# Patient Record
Sex: Female | Born: 1970
Health system: Southern US, Community
[De-identification: ages and names within clinical notes are randomized; demographics above are authoritative.]

## PROBLEM LIST (undated history)

## (undated) DIAGNOSIS — D8989 Other specified disorders involving the immune mechanism, not elsewhere classified: Secondary | ICD-10-CM

## (undated) DIAGNOSIS — A630 Anogenital (venereal) warts: Secondary | ICD-10-CM

## (undated) DIAGNOSIS — K219 Gastro-esophageal reflux disease without esophagitis: Secondary | ICD-10-CM

## (undated) DIAGNOSIS — I471 Supraventricular tachycardia, unspecified: Secondary | ICD-10-CM

## (undated) DIAGNOSIS — L509 Urticaria, unspecified: Secondary | ICD-10-CM

## (undated) DIAGNOSIS — M2550 Pain in unspecified joint: Secondary | ICD-10-CM

## (undated) DIAGNOSIS — F32A Depression, unspecified: Secondary | ICD-10-CM

## (undated) DIAGNOSIS — T7840XA Allergy, unspecified, initial encounter: Secondary | ICD-10-CM

## (undated) DIAGNOSIS — F419 Anxiety disorder, unspecified: Secondary | ICD-10-CM

## (undated) DIAGNOSIS — R87619 Unspecified abnormal cytological findings in specimens from cervix uteri: Secondary | ICD-10-CM

## (undated) DIAGNOSIS — E079 Disorder of thyroid, unspecified: Secondary | ICD-10-CM

## (undated) DIAGNOSIS — I10 Essential (primary) hypertension: Secondary | ICD-10-CM

## (undated) DIAGNOSIS — E039 Hypothyroidism, unspecified: Secondary | ICD-10-CM

## (undated) DIAGNOSIS — E559 Vitamin D deficiency, unspecified: Secondary | ICD-10-CM

## (undated) HISTORY — DX: Essential (primary) hypertension: I10

## (undated) HISTORY — DX: Anogenital (venereal) warts: A63.0

## (undated) HISTORY — DX: Pain in unspecified joint: M25.50

## (undated) HISTORY — DX: Allergy, unspecified, initial encounter: T78.40XA

## (undated) HISTORY — DX: Other specified disorders involving the immune mechanism, not elsewhere classified: D89.89

## (undated) HISTORY — DX: Vitamin D deficiency, unspecified: E55.9

## (undated) HISTORY — PX: CYST EXCISION: SHX5701

## (undated) HISTORY — PX: COLONOSCOPY: SHX174

## (undated) HISTORY — DX: Anxiety disorder, unspecified: F41.9

## (undated) HISTORY — DX: Depression, unspecified: F32.A

## (undated) HISTORY — DX: Gastro-esophageal reflux disease without esophagitis: K21.9

## (undated) HISTORY — DX: Unspecified abnormal cytological findings in specimens from cervix uteri: R87.619

## (undated) HISTORY — DX: Urticaria, unspecified: L50.9

## (undated) HISTORY — DX: Disorder of thyroid, unspecified: E07.9

## (undated) HISTORY — DX: Hypothyroidism, unspecified: E03.9

## (undated) HISTORY — DX: Supraventricular tachycardia, unspecified: I47.10

---

## 2011-04-30 DIAGNOSIS — E079 Disorder of thyroid, unspecified: Secondary | ICD-10-CM

## 2011-04-30 HISTORY — DX: Disorder of thyroid, unspecified: E07.9

## 2015-09-14 ENCOUNTER — Ambulatory Visit: Payer: Self-pay | Admitting: Allergy and Immunology

## 2015-09-19 ENCOUNTER — Emergency Department (HOSPITAL_COMMUNITY)
Admission: EM | Admit: 2015-09-19 | Discharge: 2015-09-19 | Disposition: A | Discharge status: Home / Self Care | Payer: 59 | Attending: Emergency Medicine | Admitting: Emergency Medicine

## 2015-09-19 ENCOUNTER — Encounter: Payer: Self-pay | Admitting: Allergy and Immunology

## 2015-09-19 ENCOUNTER — Ambulatory Visit (INDEPENDENT_AMBULATORY_CARE_PROVIDER_SITE_OTHER): Payer: 59 | Admitting: Allergy and Immunology

## 2015-09-19 VITALS — BP 110/76 | HR 100 | Temp 98.4°F | Resp 18 | Ht 62.6 in | Wt 201.9 lb

## 2015-09-19 DIAGNOSIS — L500 Allergic urticaria: Secondary | ICD-10-CM

## 2015-09-19 DIAGNOSIS — L509 Urticaria, unspecified: Secondary | ICD-10-CM | POA: Diagnosis not present

## 2015-09-19 DIAGNOSIS — T783XXD Angioneurotic edema, subsequent encounter: Secondary | ICD-10-CM | POA: Diagnosis not present

## 2015-09-19 DIAGNOSIS — Z79899 Other long term (current) drug therapy: Secondary | ICD-10-CM | POA: Diagnosis not present

## 2015-09-19 DIAGNOSIS — T7840XA Allergy, unspecified, initial encounter: Secondary | ICD-10-CM

## 2015-09-19 DIAGNOSIS — T783XXA Angioneurotic edema, initial encounter: Secondary | ICD-10-CM | POA: Insufficient documentation

## 2015-09-19 LAB — CBC WITH DIFFERENTIAL/PLATELET
BASOS ABS: 0 10*3/uL (ref 0.0–0.1)
Basophils Relative: 0 %
EOS PCT: 0 %
Eosinophils Absolute: 0 10*3/uL (ref 0.0–0.7)
HCT: 40.8 % (ref 36.0–46.0)
Hemoglobin: 14 g/dL (ref 12.0–15.0)
Lymphocytes Relative: 15 %
Lymphs Abs: 2 10*3/uL (ref 0.7–4.0)
MCH: 30.6 pg (ref 26.0–34.0)
MCHC: 34.3 g/dL (ref 30.0–36.0)
MCV: 89.3 fL (ref 78.0–100.0)
MONO ABS: 0.3 10*3/uL (ref 0.1–1.0)
Monocytes Relative: 3 %
Neutro Abs: 10.9 10*3/uL — ABNORMAL HIGH (ref 1.7–7.7)
Neutrophils Relative %: 82 %
PLATELETS: 451 10*3/uL — AB (ref 150–400)
RBC: 4.57 MIL/uL (ref 3.87–5.11)
RDW: 12.4 % (ref 11.5–15.5)
WBC: 13.2 10*3/uL — ABNORMAL HIGH (ref 4.0–10.5)

## 2015-09-19 LAB — COMPREHENSIVE METABOLIC PANEL
ALBUMIN: 3.9 g/dL (ref 3.5–5.0)
ALT: 13 U/L — ABNORMAL LOW (ref 14–54)
AST: 21 U/L (ref 15–41)
Alkaline Phosphatase: 55 U/L (ref 38–126)
Anion gap: 6 (ref 5–15)
BUN: 12 mg/dL (ref 6–20)
CHLORIDE: 109 mmol/L (ref 101–111)
CO2: 21 mmol/L — ABNORMAL LOW (ref 22–32)
Calcium: 8.8 mg/dL — ABNORMAL LOW (ref 8.9–10.3)
Creatinine, Ser: 0.7 mg/dL (ref 0.44–1.00)
GFR calc Af Amer: >60 mL/min (ref >60)
Glucose, Bld: 92 mg/dL (ref 65–99)
POTASSIUM: 3.6 mmol/L (ref 3.5–5.1)
Sodium: 136 mmol/L (ref 135–145)
Total Bilirubin: 0.5 mg/dL (ref 0.3–1.2)
Total Protein: 6.9 g/dL (ref 6.5–8.1)

## 2015-09-19 MED ORDER — FEXOFENADINE HCL 180 MG PO TABS: 180.0000 mg | ORAL_TABLET | Freq: Every day | ORAL | Status: DC

## 2015-09-19 MED ORDER — RANITIDINE HCL 150 MG PO TABS: 150.0000 mg | ORAL_TABLET | Freq: Two times a day (BID) | ORAL | Status: DC

## 2015-09-19 MED ORDER — LEVOCETIRIZINE DIHYDROCHLORIDE 5 MG PO TABS: 5.0000 mg | ORAL_TABLET | Freq: Every evening | ORAL | Status: DC

## 2015-09-19 MED ORDER — DIPHENHYDRAMINE HCL 50 MG/ML IJ SOLN
75.0000 mg | Freq: Once | INTRAMUSCULAR | Status: AC
  Administered 2015-09-19: 75 mg via INTRAVENOUS
  Filled 2015-09-19: qty 2

## 2015-09-19 MED FILL — LEVOCETIRIZINE 5 MG TABLET: 5 | 30 days supply | Qty: 30 | Fill #0

## 2015-09-19 MED FILL — raNITIdine HCL 150 MG TABS: 150 | 30 days supply | Qty: 60 | Fill #0

## 2015-09-19 NOTE — Assessment & Plan Note (Addendum)
Regarding the symptoms prompting the emergency department visit today, she was most likely experiencing recurrence of generalized urticaria while off of antihistamines and, given her prolonged dependence on high levels of antihistamines, she may have been experiencing some anxiety as well. While Dyamon was in the emergency department this morning, I asked the emergency department physician to check a serum tryptase level and CBC with differential.  We are still awaiting the final results.  For now, she will follow the treatment plan as outlined above.  Should significant symptoms recur or new symptoms occur, a journal is to be kept recording any foods eaten, beverages consumed, medications taken, activities performed, and environmental conditions within a 6 hour time period prior to the onset of symptoms. For any symptoms concerning for anaphylaxis, 911 is to be called immediately.

## 2015-09-19 NOTE — Progress Notes (Signed)
New Patient Note  RE: Julie Yang MRN: [id number] DOB: May 09, 1970 Date of Office Visit: 09/19/2015  Referring provider: No ref. provider found Primary care provider: No PCP Per Patient  Chief Complaint: Urticaria and Angioedema   History of present illness: HPI Comments: Julie Yang is a 45 y.o. female presenting today for evaluation of chronic urticaria.  She reports that since he was 45 years of age she has experienced recurrent episodes of generalized urticaria, oftentimes with associated angioedema.  The episodes seem to occur approximately every 3 years and last for a several months to a year at a time.  She does not experience concomitant cardiopulmonary or GI symptoms.  Lab work revealed elevated antibody to the high affinity IgE mast cell receptor, low IgA, and elevated ANA.  She reports that her most successful medical regimen consisted of sulfasalazine 2000 mg daily, titrated to symptoms, hydroxyzine 50 mg daily, doxepin 10 mg daily, cetirizine 2040 mg daily, and ranitidine 300 mg daily.  Since there were a she has experienced recurrent urticaria requiring high doses of antihistamines to suppress symptoms.  She has been taking cetirizine 120 mg daily, ranitidine nontender milligrams daily, and diphenhydramine 75 mg daily at bedtime.  In anticipation of today's evaluation and possible testing, she discontinued antihistamines 3 days ago and earlier today went to the emergency department for generalized urticaria as well as the perceived sensation of throat tightness.  She was treated with 75 mg of diphenhydramine with symptom resolution.  No angioedema or pharyngeal edema was noted.  She did not experience concomitant cardiopulmonary or other GI symptoms.  She notes that her urticaria is triggered by emotional stress.  No other medication, food, or environmental triggers have been identified.   Assessment and plan: Chronic urticaria We will attempt to optimize her medications.   I discussed with her the law of diminishing returns regarding the markedly elevated doses of antihistamines that she has been taking.  Elevated autoantibodies have been detected on previous lab work, however given the refractory nature of her urticaria we will proceed with a therapeutic trial of omalizumab in hopes of reducing medication requirement.  Julie Yang is scheduled to return next week to initiate therapeutic trial of omalizumab.  Instructions have been discussed and provided for H1/H2 receptor blockade with titration to find lowest effective dose.  A prescription has been provided for levocetirizine, 5 mg daily as needed.  Allergic reaction Regarding the symptoms prompting the emergency department visit today, she was most likely experiencing recurrence of generalized urticaria while off of antihistamines and, given her prolonged dependence on high levels of antihistamines, she may have been experiencing some anxiety as well. While Julie Yang was in the emergency department this morning, I asked the emergency department physician to check a serum tryptase level and CBC with differential.  We are still awaiting the final results.  For now, she will follow the treatment plan as outlined above.  Should significant symptoms recur or new symptoms occur, a journal is to be kept recording any foods eaten, beverages consumed, medications taken, activities performed, and environmental conditions within a 6 hour time period prior to the onset of symptoms. For any symptoms concerning for anaphylaxis, 911 is to be called immediately.   Angioedema Associated angioedema occurs in up to 50% of patients with chronic urticaria.  Treatment/diagnostic plan as outlined above.    Meds ordered this encounter  Medications  . levocetirizine (XYZAL) 5 MG tablet    Sig: Take 1 tablet (5 mg total) by mouth  every evening.    Dispense:  30 tablet    Refill:  5  . fexofenadine (ALLEGRA) 180 MG tablet    Sig: Take 1  tablet (180 mg total) by mouth daily.    Dispense:  30 tablet    Refill:  5  . ranitidine (ZANTAC) 150 MG tablet    Sig: Take 1 tablet (150 mg total) by mouth 2 (two) times daily.    Dispense:  60 tablet    Refill:  5    Diagnositics: Serum tryptase level and CBC with differential drawn in the emergency department today: Final results pending.     Physical examination: Blood pressure 110/76, pulse 100, temperature 98.4 F (36.9 C), temperature source Oral, resp. rate 18, height 5' 2.6" (1.59 m), weight 201 lb 15.1 oz (91.6 kg).  General: Alert, interactive, in no acute distress. Lungs: Clear to auscultation without wheezing, rhonchi or rales. CV: Normal S1, S2 without murmurs. Abdomen: Nondistended, nontender. Skin: Warm and dry, without lesions or rashes. Extremities:  No clubbing, cyanosis or edema. Neuro:   Grossly intact.  Review of systems:  Review of Systems  Constitutional: Negative for fever, chills and weight loss.  HENT: Negative for congestion and nosebleeds.   Eyes: Negative for blurred vision.  Respiratory: Negative for cough, hemoptysis, shortness of breath and wheezing.   Cardiovascular: Negative for chest pain.  Gastrointestinal: Negative for diarrhea and constipation.  Genitourinary: Negative for dysuria.  Musculoskeletal: Negative for myalgias and joint pain.  Skin: Positive for itching and rash.  Neurological: Negative for dizziness.  Endo/Heme/Allergies: Negative for environmental allergies. Does not bruise/bleed easily.    Past medical history:  Past Medical History  Diagnosis Date  . Urticaria     Past surgical history:  History reviewed. No pertinent past surgical history.  Family history: History reviewed. No pertinent family history.  Social history: Social History   Social History  . Marital Status: Married    Spouse Name: N/A  . Number of Children: N/A  . Years of Education: N/A   Occupational History  . Not on file.   Social  History Main Topics  . Smoking status: Never Smoker   . Smokeless tobacco: Not on file  . Alcohol Use: Not on file  . Drug Use: Not on file  . Sexual Activity: Not on file   Other Topics Concern  . Not on file   Social History Narrative  . No narrative on file   Environmental: The patient lives in a 43 year old house with carpeting in the bedroom, gassy, and central air.  There are 2 cats in the house, one of which has access to her bedroom.  She is a nonsmoker.      Medication List       This list is accurate as of: 09/19/15  6:45 PM.  Always use your most recent med list.               acetaminophen 325 MG tablet  Commonly known as:  TYLENOL  Take 650 mg by mouth every 6 (six) hours as needed for mild pain or moderate pain.     cetirizine 10 MG tablet  Commonly known as:  ZYRTEC  Take 40 mg by mouth 3 (three) times daily.     diphenhydrAMINE 25 MG tablet  Commonly known as:  SOMINEX  Take 75 mg by mouth at bedtime.     fexofenadine 180 MG tablet  Commonly known as:  ALLEGRA  Take 1 tablet (180 mg total)  by mouth daily.     ITCH-X EX  Apply 1 application topically 3 (three) times daily as needed (itching).     levocetirizine 5 MG tablet  Commonly known as:  XYZAL  Take 1 tablet (5 mg total) by mouth every evening.     levothyroxine 75 MCG tablet  Commonly known as:  SYNTHROID, LEVOTHROID  Take 75 mcg by mouth daily before breakfast.     PROBIOTIC ADVANCED PO  Take 1 capsule by mouth daily.     ranitidine 150 MG tablet  Commonly known as:  ZANTAC  Take 1 tablet (150 mg total) by mouth 2 (two) times daily.     Vitamin D 2000 units Caps  Take 1 capsule by mouth daily.        Known medication allergies: Allergies  Allergen Reactions  . Aspirin Hives    Exacerbates hives  . Ibuprofen Hives    Exacerbates hives  . Iodine Hives    I appreciate the opportunity to take part in this [place] care. Please do not hesitate to contact me with  questions.  Sincerely,   R. Edgar Frisk, MD

## 2015-09-19 NOTE — Assessment & Plan Note (Signed)
Associated angioedema occurs in up to 50% of patients with chronic urticaria.  Treatment/diagnostic plan as outlined above. 

## 2015-09-19 NOTE — ED Notes (Signed)
Pt has hx of chronic idiopathic urticaria. Pt reports being taken off of antihistamines Saturday. Hives have started getting worse since Saturday. This morning pt started having itching to tongue and the inside of her mouth. Denies SOB.

## 2015-09-19 NOTE — Assessment & Plan Note (Addendum)
We will attempt to optimize her medications.  I discussed with her the law of diminishing returns regarding the markedly elevated doses of antihistamines that she has been taking.  Elevated autoantibodies have been detected on previous lab work, however given the refractory nature of her urticaria we will proceed with a therapeutic trial of omalizumab in hopes of reducing medication requirement.  Julie Yang is scheduled to return next week to initiate therapeutic trial of omalizumab.  Instructions have been discussed and provided for H1/H2 receptor blockade with titration to find lowest effective dose.  A prescription has been provided for levocetirizine, 5 mg daily as needed.

## 2015-09-19 NOTE — ED Provider Notes (Signed)
CSN: [id number]     Arrival date & time 09/19/15  [id number] History   First MD Initiated Contact with Patient 09/19/15 [phone]     Chief Complaint  Patient presents with  . Allergic Reaction     (Consider location/radiation/quality/duration/timing/severity/associated sxs/prior Treatment) Patient is a 45 y.o. female presenting with allergic reaction. The history is provided by the patient.  Allergic Reaction Presenting symptoms: rash   Presenting symptoms: no difficulty swallowing   Patient with hx chronic, recurrent, autoimmune vs idiopathic urticaria.  States has had recurrently/chronically since age 10 with interspersed periods of remission.  Patient reports continual hives/itching since 05/2015 despite taking zyrtec 40 mg tid and benadryl 75 mg qhs.  She currently has help antihistamine therapy x 2 days in anticipation of new allergist appt today, and states diffuse itching, hives is worse, and that today had sense throat swelling/hoarse voice.  No wheezing or increased wob. No stridor. No sore throat. No fever or chills. No recent new meds or new foods. No new home/personal products.       No past medical history on file. No past surgical history on file. No family history on file. Social History  Substance Use Topics  . Smoking status: Not on file  . Smokeless tobacco: Not on file  . Alcohol Use: Not on file   OB History    No data available     Review of Systems  Constitutional: Negative for fever.  HENT: Negative for sore throat and trouble swallowing.   Eyes: Negative for redness.  Respiratory: Negative for shortness of breath.   Cardiovascular: Negative for chest pain.  Gastrointestinal: Negative for vomiting, abdominal pain and diarrhea.  Genitourinary: Negative for flank pain.  Musculoskeletal: Negative for back pain and neck pain.  Skin: Positive for rash.  Neurological: Negative for headaches.  Hematological: Does not bruise/bleed easily.  Psychiatric/Behavioral:  Negative for confusion.      Allergies  Aspirin; Ibuprofen; and Iodine  Home Medications   Prior to Admission medications   Medication Sig Start Date End Date Taking? Authorizing Provider  acetaminophen (TYLENOL) 325 MG tablet Take 650 mg by mouth every 6 (six) hours as needed for mild pain or moderate pain.   Yes Historical Provider, MD  cetirizine (ZYRTEC) 10 MG tablet Take 40 mg by mouth 3 (three) times daily.   Yes Historical Provider, MD  Cholecalciferol (VITAMIN D) 2000 units CAPS Take 1 capsule by mouth daily.   Yes Historical Provider, MD  diphenhydrAMINE (SOMINEX) 25 MG tablet Take 75 mg by mouth at bedtime.   Yes Historical Provider, MD  levothyroxine (SYNTHROID, LEVOTHROID) 75 MCG tablet Take 75 mcg by mouth daily before breakfast.   Yes Historical Provider, MD  Pramoxine-Benzyl Alcohol (ITCH-X EX) Apply 1 application topically 3 (three) times daily as needed (itching).   Yes Historical Provider, MD  Probiotic Product (PROBIOTIC ADVANCED PO) Take 1 capsule by mouth daily.   Yes Historical Provider, MD   BP 115/80 mmHg  Pulse 95  Temp(Src) 98 F (36.7 C) (Oral)  Resp 17  SpO2 100% Physical Exam  Constitutional: She appears well-developed and well-nourished. No distress.  HENT:  Mouth/Throat: Oropharynx is clear and moist.  No mm swelling/rash. No throat swelling noted. No angioedema.   Eyes: Conjunctivae are normal. Pupils are equal, round, and reactive to light. No scleral icterus.  Neck: Neck supple. No tracheal deviation present.  No stiffness or rigidity  Cardiovascular: Normal rate, regular rhythm, normal heart sounds and intact distal pulses.  Exam reveals  no gallop and no friction rub.   No murmur heard. Pulmonary/Chest: Effort normal and breath sounds normal. No respiratory distress. She has no wheezes.  Abdominal: Soft. Normal appearance and bowel sounds are normal. She exhibits no distension. There is no tenderness.  Musculoskeletal: She exhibits no edema.   Neurological: She is alert.  Skin: Skin is warm and dry. She is not diaphoretic.  Diffuse urticarial rash to trunk and extremities.   Psychiatric:  Mildly anxious.   Nursing note and vitals reviewed.   ED Course  Procedures (including critical care time) Labs Review   Results for orders placed or performed during the hospital encounter of 09/19/15  CBC with Differential/Platelet  Result Value Ref Range   WBC 13.2 (H) 4.0 - 10.5 K/uL   RBC 4.57 3.87 - 5.11 MIL/uL   Hemoglobin 14.0 12.0 - 15.0 g/dL   HCT 40.8 36.0 - 46.0 %   MCV 89.3 78.0 - 100.0 fL   MCH 30.6 26.0 - 34.0 pg   MCHC 34.3 30.0 - 36.0 g/dL   RDW 12.4 11.5 - 15.5 %   Platelets 451 (H) 150 - 400 K/uL   Neutrophils Relative % 82 %   Neutro Abs 10.9 (H) 1.7 - 7.7 K/uL   Lymphocytes Relative 15 %   Lymphs Abs 2.0 0.7 - 4.0 K/uL   Monocytes Relative 3 %   Monocytes Absolute 0.3 0.1 - 1.0 K/uL   Eosinophils Relative 0 %   Eosinophils Absolute 0.0 0.0 - 0.7 K/uL   Basophils Relative 0 %   Basophils Absolute 0.0 0.0 - 0.1 K/uL  Comprehensive metabolic panel  Result Value Ref Range   Sodium 136 135 - 145 mmol/L   Potassium 3.6 3.5 - 5.1 mmol/L   Chloride 109 101 - 111 mmol/L   CO2 21 (L) 22 - 32 mmol/L   Glucose, Bld 92 65 - 99 mg/dL   BUN 12 6 - 20 mg/dL   Creatinine, Ser 0.70 0.44 - 1.00 mg/dL   Calcium 8.8 (L) 8.9 - 10.3 mg/dL   Total Protein 6.9 6.5 - 8.1 g/dL   Albumin 3.9 3.5 - 5.0 g/dL   AST 21 15 - 41 U/L   ALT 13 (L) 14 - 54 U/L   Alkaline Phosphatase 55 38 - 126 U/L   Total Bilirubin 0.5 0.3 - 1.2 mg/dL   GFR calc non Af Amer >60 >60 mL/min   GFR calc Af Amer >60 >60 mL/min   Anion gap 6 5 - 15     I have personally reviewed and evaluated these images and lab results as part of my medical decision-making.   EKG Interpretation   Date/Time:  Tuesday Sep 19 2015 09:44:59 EDT Ventricular Rate:  88 PR Interval:  136 QRS Duration: 89 QT Interval:  352 QTC Calculation: 426 R Axis:   64 Text  Interpretation:  Sinus rhythm No previous tracing Confirmed by Ashok Cordia   MD, Lennette Bihari ([id number]) on 09/19/2015 9:55:18 AM      MDM   Iv ns.   Patient indicates normally take high dose antihistamine, zyrtec 120 mg a day, and benadryl 75 mg in pm.  Has not had any in anticipation of allergist visit today.    Given symptoms worse, diffuse urticaria, and sense throat swelling/hoarse voice, will give dose benadryl iv.  Pt states steroid meds in past have made her symptoms worse, and does not want to take.  Discussed with pts (new) allergies/immunologist, Dr Verlin Fester, with whom she has appt at 1  pm today.  He requests we send cbc w diff, tryptase level, and he will see in office later today, and discuss other tx options with her.   Recheck, patient feels improved. Itchy improved, urticaria present but fading as compared to prior.  Patient ambulatory to bathroom.  Voice normal, no trouble breathing, or swallowing.    Patient is to keep appt with her new allergist today at 1 pm - they plan to discuss xolair, and other tx options.  Patient currently appears stable for d/c (pt has sig other/spouse to drive her).         Lajean Saver, MD 09/19/15 [phone]

## 2015-09-19 NOTE — Addendum Note (Signed)
Addended by: Golda Acre C on: 09/19/2015 06:47 PM   Modules accepted: Level of Service

## 2015-09-19 NOTE — Discharge Instructions (Signed)
It was our pleasure to provide your ER care today - we hope that you feel better.  Follow up with Dr Verlin Fester today as planned.  Return to ER right away if worse, new symptoms, trouble breathing, throat swelling/closing, fevers, fainting, other concern.  You were given medication in the ER that causes drowsiness - no driving for the next 4 hours.      Hives Hives are itchy, red, swollen areas of the skin. They can vary in size and location on your body. Hives can come and go for hours or several days (acute hives) or for several weeks (chronic hives). Hives do not spread from person to person (noncontagious). They may get worse with scratching, exercise, and emotional stress. CAUSES   Allergic reaction to food, additives, or drugs.  Infections, including the common cold.  Illness, such as vasculitis, lupus, or thyroid disease.  Exposure to sunlight, heat, or cold.  Exercise.  Stress.  Contact with chemicals. SYMPTOMS   Red or white swollen patches on the skin. The patches may change size, shape, and location quickly and repeatedly.  Itching.  Swelling of the hands, feet, and face. This may occur if hives develop deeper in the skin. DIAGNOSIS  Your caregiver can usually tell what is wrong by performing a physical exam. Skin or blood tests may also be done to determine the cause of your hives. In some cases, the cause cannot be determined. TREATMENT  Mild cases usually get better with medicines such as antihistamines. Severe cases may require an emergency epinephrine injection. If the cause of your hives is known, treatment includes avoiding that trigger.  HOME CARE INSTRUCTIONS   Avoid causes that trigger your hives.  Take antihistamines as directed by your caregiver to reduce the severity of your hives. Non-sedating or low-sedating antihistamines are usually recommended. Do not drive while taking an antihistamine.  Take any other medicines prescribed for itching as  directed by your caregiver.  Wear loose-fitting clothing.  Keep all follow-up appointments as directed by your caregiver. SEEK MEDICAL CARE IF:   You have persistent or severe itching that is not relieved with medicine.  You have painful or swollen joints. SEEK IMMEDIATE MEDICAL CARE IF:   You have a fever.  Your tongue or lips are swollen.  You have trouble breathing or swallowing.  You feel tightness in the throat or chest.  You have abdominal pain. These problems may be the first sign of a life-threatening allergic reaction. Call your local emergency services (911 in [place] MAKE SURE YOU:   Understand these instructions.  Will watch your condition.  Will get help right away if you are not doing well or get worse.   This information is not intended to replace advice given to you by your health care provider. Make sure you discuss any questions you have with your health care provider.   Document Released: 04/15/2005 Document Revised: 04/20/2013 Document Reviewed: 07/09/2011 Elsevier Interactive Patient Education [employer].

## 2015-09-19 NOTE — Patient Instructions (Addendum)
Chronic urticaria We will attempt to optimize her medications.  I discussed with her the law of diminishing returns regarding the markedly elevated doses of antihistamines that she has been taking.  Elevated autoantibodies have been detected on previous lab work, however given the refractory nature of her urticaria we will proceed with a therapeutic trial of omalizumab in hopes of reducing medication requirement.  Tiawna is scheduled to return next week to initiate therapeutic trial of omalizumab.  Instructions have been discussed and provided for H1/H2 receptor blockade with titration to find lowest effective dose.  A prescription has been provided for levocetirizine, 5 mg daily as needed.  Allergic reaction Regarding the symptoms prompting the emergency department visit today, she was most likely experiencing recurrence of generalized urticaria while off of antihistamines and, given her prolonged dependence on high levels of antihistamines, she may have been experiencing some anxiety as well. While Maram was in the emergency department this morning, I asked the emergency department physician to check a serum tryptase level and CBC with differential.  We are still awaiting the final results.  For now, she will follow the treatment plan as outlined above.  Should significant symptoms recur or new symptoms occur, a journal is to be kept recording any foods eaten, beverages consumed, medications taken, activities performed, and environmental conditions within a 6 hour time period prior to the onset of symptoms. For any symptoms concerning for anaphylaxis, 911 is to be called immediately.   Angioedema Associated angioedema occurs in up to 50% of patients with chronic urticaria.  Treatment/diagnostic plan as outlined above.    Return in about 1 week (around 09/26/2015) for first omalizumab injection.  Urticaria (Hives)  . Levocetirizine (Xyzal) 5 mg twice a day and ranitidine (Zantac) 150 mg twice  a day. If no symptoms for 7-14 days then decrease to. . Levocetirizine (Xyzal) 5 mg twice a day and ranitidine (Zantac) 150 mg once a day.  If no symptoms for 7-14 days then decrease to. . Levocetirizine (Xyzal) 5 mg twice a day.  If no symptoms for 7-14 days then decrease to. . Levocetirizine (Xyzal) 5 mg once a day.  May add fexofenadine or Benadryl (diphenhydramine) as needed for breakthrough symptoms.

## 2015-09-20 LAB — TRYPTASE: Tryptase: 10.4 ug/L (ref 2.2–13.2)

## 2015-09-21 ENCOUNTER — Telehealth: Payer: Self-pay | Admitting: Allergy and Immunology

## 2015-09-21 NOTE — Telephone Encounter (Signed)
She called wanting to tell you she has had maybe a 20% improvement with the hives. She still has swelling in her hands and feet. Also, she is still having itching all the time but it is manageable now.

## 2015-09-26 ENCOUNTER — Ambulatory Visit (INDEPENDENT_AMBULATORY_CARE_PROVIDER_SITE_OTHER): Payer: 59 | Admitting: *Deleted

## 2015-09-26 DIAGNOSIS — L500 Allergic urticaria: Secondary | ICD-10-CM

## 2015-09-26 DIAGNOSIS — L501 Idiopathic urticaria: Secondary | ICD-10-CM

## 2015-09-26 MED ORDER — EPINEPHRINE 0.3 MG/0.3ML IJ SOAJ: INTRAMUSCULAR | Status: DC

## 2015-09-26 MED ORDER — OMALIZUMAB 150 MG ~~LOC~~ SOLR
300.0000 mg | SUBCUTANEOUS | Status: DC
  Administered 2015-09-26 – 2016-10-22 (×14): 300 mg via SUBCUTANEOUS

## 2015-09-26 MED FILL — EPINEPHRINE 0.3 MG AUTO-INJ: 0.3 | 30 days supply | Qty: 2 | Fill #0

## 2015-09-26 NOTE — Progress Notes (Signed)
Immunotherapy   Patient Details  Name: Julie Yang MRN: [id number] Date of Birth: 01-07-1971  09/26/2015  Julie Yang started injections for  XOLAIR 300 MG  Following schedule: EVERY 4 WEEKS Epi-Pen:Prescription for Epi-Pen given Consent signed and patient instructions given. Sample given today patient waited 60 min without difficulty appt made for 4 weeks. Will submit Xolair per Dr Dorthy Cooler Daundre Biel 09/26/2015, 11:25 AM

## 2015-09-26 NOTE — Telephone Encounter (Signed)
Patient started [place] sent Rx to [facility] and coupon info given to patient

## 2015-09-26 NOTE — Telephone Encounter (Signed)
Xolair sample was initiated today and we will submit for insurance coverage. Continue H1/H2 receptor blockade, titrating to the lowest effective dose necessary to suppress urticaria.

## 2015-10-03 ENCOUNTER — Other Ambulatory Visit: Payer: Self-pay

## 2015-10-03 ENCOUNTER — Telehealth: Payer: Self-pay | Admitting: Allergy and Immunology

## 2015-10-03 DIAGNOSIS — L500 Allergic urticaria: Secondary | ICD-10-CM

## 2015-10-03 MED ORDER — LEVOCETIRIZINE DIHYDROCHLORIDE 5 MG PO TABS: ORAL_TABLET | ORAL | Status: DC

## 2015-10-03 MED FILL — LEVOCETIRIZINE 5 MG TABLET: 5 | 30 days supply | Qty: 60 | Fill #0

## 2015-10-03 NOTE — Telephone Encounter (Signed)
She is taking Xolair. She has seen a reduction in the hives but no real relief. She is having trouble sleeping because of the itching.. Can she try Sulfasalazine in conjunction with the Xolair. She used this treatment in [place] and it worked for her there.

## 2015-10-03 NOTE — Telephone Encounter (Signed)
She had 1st dose Xolair on May 30

## 2015-10-04 ENCOUNTER — Ambulatory Visit (HOSPITAL_BASED_OUTPATIENT_CLINIC_OR_DEPARTMENT_OTHER): Payer: 59 | Admitting: Pharmacist

## 2015-10-04 DIAGNOSIS — L508 Other urticaria: Secondary | ICD-10-CM

## 2015-10-04 MED ORDER — OMALIZUMAB 150 MG ~~LOC~~ SOLR: 300.0000 mg | SUBCUTANEOUS | Status: DC

## 2015-10-04 MED ORDER — DOXEPIN HCL 10 MG PO CAPS: 10.0000 mg | ORAL_CAPSULE | Freq: Every evening | ORAL | Status: DC | PRN

## 2015-10-04 MED FILL — DOXEPIN 10 MG CAPSULE: 10 | 30 days supply | Qty: 30 | Fill #0

## 2015-10-04 NOTE — Telephone Encounter (Signed)
TALKED TO PATIENT SHE ADVISED SOME IMPROVEMENT IN HIVES BUT ITCHING NOT MUCH BETTER TAKING ANTIHISTAMINES AS DIRECTED BY DR BOBBITT DURING DAY AND BENADRYL AT NIGHT BUT WAKING UP ITCHING.  PATIENT ADVISED SHE HAS BEEN PRESCRIBED DOXEPIN AT NIGHT AND THINKS THIS MAY HELP HER ITCHING AND LET HER REST.  CONSULTED WITH DR Neldon Mc AND HE ADVISED TO PROVIDE RX TO PATIENT TO TRY.  RX SENT TO [facility] AND ADVISED PATIENT

## 2015-10-04 NOTE — Telephone Encounter (Signed)
Please have Dr. Verlin Fester address this patient's request as I have never seen her in this clinic.        ----- Message -----    From: Carin Hock, CMA    Sent: 10/03/2015  4:19 PM     To: Jiles Prows, MD

## 2015-10-04 NOTE — Progress Notes (Addendum)
S: Patient presents today to the [facility].  Patient is currently taking Xolair for chronic urticaria. Patient is managed by Dr. Verlin Fester for this.   Adherence: denies any missed doses (just has had one dose so far)  Chronic idiopathic urticaria: SubQ: 150 or 300 mg every 4 weeks. Dosing is not dependent on serum IgE (free or total) level or body weight.She is currently taking 300 mg monthly.  Drug-drug interactions: none  Monitoring: CV effects: denies Eosinophilia and vasculitis: denies Fever/arthralgia/rash: denies Hypersensitivity/Anaphylaxis: denies (has EpiPen) Malignant neoplasms: denies  O:     Lab Results  Component Value Date   WBC 13.2* 09/19/2015   HGB 14.0 09/19/2015   HCT 40.8 09/19/2015   MCV 89.3 09/19/2015   PLT 451* 09/19/2015      Chemistry      Component Value Date/Time   NA 136 09/19/2015 0945   K 3.6 09/19/2015 0945   CL 109 09/19/2015 0945   CO2 21* 09/19/2015 0945   BUN 12 09/19/2015 0945   CREATININE 0.70 09/19/2015 0945      Component Value Date/Time   CALCIUM 8.8* 09/19/2015 0945   ALKPHOS 55 09/19/2015 0945   AST 21 09/19/2015 0945   ALT 13* 09/19/2015 0945   BILITOT 0.5 09/19/2015 0945       A/P: 1. Medication review: Patient tolerated her first dose of Xolair the other week with no injection site reactions or other adverse effects. No recommendations for changes. Reviewed the medication with the patient including the following: Xolair, omalizumab, is a novel IgE blocker. It is used to treat chronic urticaria but can take a few weeks to months to take effect. It can cause anaphylaxis and injection site reactions.     Nicoletta Ba, PharmD, BCPS, [facility] [phone]  Evaluation and management procedures were performed by the Clinical Pharmacy Practitioner under my supervision and collaboration. I have reviewed the  Practitioner's note and chart, and I agree with the management and plan as documented above.   Angelica Chessman, MD, [place], [place], [place], [place] and [place], [place]   10/04/2015, 4:49 PM

## 2015-10-04 NOTE — Addendum Note (Signed)
Addended by: Carin Hock on: 10/04/2015 04:28 PM   Modules accepted: Orders

## 2015-10-17 MED FILL — *XOLAIR 150MG VIAL: 150 | 28 days supply | Qty: 2 | Fill #0

## 2015-10-23 ENCOUNTER — Ambulatory Visit (INDEPENDENT_AMBULATORY_CARE_PROVIDER_SITE_OTHER): Payer: 59 | Admitting: *Deleted

## 2015-10-23 DIAGNOSIS — L500 Allergic urticaria: Secondary | ICD-10-CM | POA: Diagnosis not present

## 2015-10-26 ENCOUNTER — Ambulatory Visit (INDEPENDENT_AMBULATORY_CARE_PROVIDER_SITE_OTHER): Payer: 59 | Admitting: Certified Nurse Midwife

## 2015-10-26 ENCOUNTER — Encounter: Payer: Self-pay | Admitting: Certified Nurse Midwife

## 2015-10-26 VITALS — BP 110/68 | HR 68 | Resp 16 | Ht 63.25 in | Wt 201.0 lb

## 2015-10-26 DIAGNOSIS — E039 Hypothyroidism, unspecified: Secondary | ICD-10-CM

## 2015-10-26 DIAGNOSIS — Z01419 Encounter for gynecological examination (general) (routine) without abnormal findings: Secondary | ICD-10-CM

## 2015-10-26 DIAGNOSIS — Z124 Encounter for screening for malignant neoplasm of cervix: Secondary | ICD-10-CM

## 2015-10-26 DIAGNOSIS — Z0000 Encounter for general adult medical examination without abnormal findings: Secondary | ICD-10-CM

## 2015-10-26 DIAGNOSIS — Z1151 Encounter for screening for human papillomavirus (HPV): Secondary | ICD-10-CM | POA: Diagnosis not present

## 2015-10-26 LAB — POCT URINALYSIS DIPSTICK
BILIRUBIN UA: NEGATIVE
Blood, UA: NEGATIVE
Glucose, UA: NEGATIVE
KETONES UA: NEGATIVE
LEUKOCYTES UA: NEGATIVE
Nitrite, UA: NEGATIVE
Protein, UA: NEGATIVE
Urobilinogen, UA: NEGATIVE
pH, UA: 5

## 2015-10-26 LAB — LIPID PANEL
Cholesterol: 145 mg/dL (ref 125–200)
HDL: 43 mg/dL — AB (ref >=46)
LDL CALC: 83 mg/dL (ref <130)
TRIGLYCERIDES: 95 mg/dL (ref <150)
Total CHOL/HDL Ratio: 3.4 Ratio (ref <=5.0)
VLDL: 19 mg/dL (ref <30)

## 2015-10-26 LAB — TSH: TSH: 1.79 mIU/L

## 2015-10-26 NOTE — Progress Notes (Signed)
45 y.o. G30P1001 Married  Caucasian Fe here to establish gyn care and for annual exam. Periods regular with duration of 5 days, heavy day 2 and then decreases to moderate before stopping. Has noted spotting approximate one week to 2 days prior to cycle.  Occasional missed period, 2 in last year.  Has subclinical hypothyroid on Synthroid, needs level checked for renewal. Sees Dermatology for autoimmune urticara medication management. History of one abnormal pap years ago with just repeat, no treatment. History of genital warts, but none noted in several years on external genital area. Aware she has gained weight and is settling in from moving here from [place], [place]. Plans to work on diet change and exercise. No other health concerns today. Works at [employer] in [employer].  Patient's last menstrual period was 10/16/2015.          Sexually active: Yes.    The current method of family planning is vasectomy.    Exercising: No.  exercise Smoker:  no  Health Maintenance: Pap:  10/16 neg MMG:  05-17-15  birads 2:neg Colonoscopy:  2/16 polyps due f/u 60yrs, endoscopy 2013 BMD:   none TDaP:  2/17 Shingles: no Pneumonia: no Hep C and HIV: HIV neg 2007 & 2009? Labs: none Self breast exam: done monthly   reports that she has never smoked. She does not have any smokeless tobacco history on file. She reports that she drinks about 1.2 - 1.8 oz of alcohol per week. She reports that she does not use illicit drugs.  Past Medical History  Diagnosis Date  . Urticaria   . Autoimmune disorder ([place])   . Genital warts   . Anxiety   . Thyroid disease     Past Surgical History  Procedure Laterality Date  . Cesarean section    . Cyst excision      from chest, benign    Current Outpatient Prescriptions  Medication Sig Dispense Refill  . Cholecalciferol (VITAMIN D) 2000 units CAPS Take 1 capsule by mouth daily.    Marland Kitchen doxepin (SINEQUAN) 10 MG capsule Take 1 capsule (10 mg total) by mouth at bedtime as needed.  30 capsule 1  . fexofenadine (ALLEGRA) 180 MG tablet Take 1 tablet (180 mg total) by mouth daily. 30 tablet 5  . levocetirizine (XYZAL) 5 MG tablet Levocetirizine (Xyzal) 5 mg twice a day  for 2 weeks if symptoms decrease then take Levocetirizine (Xyzal) 5 mg once a day. 60 tablet 5  . levothyroxine (SYNTHROID, LEVOTHROID) 75 MCG tablet Take 75 mcg by mouth daily before breakfast.    . omalizumab (XOLAIR) 150 MG injection Inject 300 mg into the skin every 28 (twenty-eight) days. 2 vial 11  . Probiotic Product (PROBIOTIC ADVANCED PO) Take 1 capsule by mouth daily.    . ranitidine (ZANTAC) 150 MG tablet Take 1 tablet (150 mg total) by mouth 2 (two) times daily. 60 tablet 5  . acetaminophen (TYLENOL) 325 MG tablet Take 650 mg by mouth every 6 (six) hours as needed for mild pain or moderate pain. Reported on 10/26/2015    . EPINEPHrine 0.3 mg/0.3 mL IJ SOAJ injection USE AS DIRECTED FOR SEVERE ALLERGIC REACTION (Patient not taking: Reported on 10/26/2015) 2 Device 1   Current Facility-Administered Medications  Medication Dose Route Frequency Provider Last Rate Last Dose  . omalizumab Arvid Right) injection 300 mg  300 mg Subcutaneous Q28 days Adelina Mings, MD   300 mg at 09/26/15 1121    Family History  Problem Relation Age of  Onset  . Heart disease Father   . Heart disease Maternal Grandmother   . Diabetes Maternal Grandmother   . Heart disease Maternal Grandfather   . Lung cancer Maternal Grandfather   . Colon cancer Maternal Grandfather   . Brain cancer Maternal Grandfather   . Heart disease Paternal Grandmother   . Heart disease Paternal Grandfather     ROS:  Pertinent items are noted in HPI.  Otherwise, a comprehensive ROS was negative.  Exam:   BP 110/68 mmHg  Pulse 68  Resp 16  Ht 5' 3.25" (1.607 m)  Wt 201 lb (91.173 kg)  BMI 35.30 kg/m2  LMP 10/16/2015 Height: 5' 3.25" (160.7 cm) Ht Readings from Last 3 Encounters:  10/26/15 5' 3.25" (1.607 m)  09/19/15 5' 2.6" (1.59 m)     General appearance: alert, cooperative and appears stated age Head: Normocephalic, without obvious abnormality, atraumatic Neck: no adenopathy, supple, symmetrical, trachea midline and thyroid normal to inspection and palpation Lungs: clear to auscultation bilaterally Breasts: normal appearance, no masses or tenderness, No nipple retraction or dimpling, No nipple discharge or bleeding, No axillary or supraclavicular adenopathy Heart: regular rate and rhythm Abdomen: soft, non-tender; no masses,  no organomegaly Extremities: extremities normal, atraumatic, no cyanosis or edema Skin: Skin color, texture, turgor normal. No rashes or lesions Lymph nodes: Cervical, supraclavicular, and axillary nodes normal. No abnormal inguinal nodes palpated Neurologic: Grossly normal   Pelvic: External genitalia:  no lesions, normal female, no genital warts noted              Urethra:  normal appearing urethra with no masses, tenderness or lesions              Bartholin's and Skene's: normal                 Vagina: normal appearing vagina with normal color and discharge, no lesions              Cervix: multiparous appearance, no cervical motion tenderness and no lesions              Pap taken: Yes.   Bimanual Exam:  Uterus:  normal size, contour, position, consistency, mobility, non-tender              Adnexa: normal adnexa and no mass, fullness, tenderness               Rectovaginal: Confirms               Anus:  normal sphincter tone, no lesions  Chaperone present: yes  A:  Well Woman with normal exam  Contraception spouse vasectomy  Occasional spotting with normal menses ? Thyroid  Hypothyroid subclinical on stable medication needs refill  History of genital warts, none noted  Family history of breast cancer MGM late age onset, colon cancer MGF  Auto immune urticaria with allergy MD management  Screening labs  P:   Reviewed health and wellness pertinent to exam  Discussed keeping menses  calendar of when in cycle spotting is occuring and if continues to advise. Patient agreeeable.  Patient has 9 pills of Synthroid will draw TSH and refill as indicated.  Lab: TSH  Reassured no genital warts noted and need to advise if reoccurs.  Stressed importance of SBE and mammogram yearly. Has information on.  Follow up with colonoscopy as indicated due to polyp history.  LABS: Lipid panel,Hep C, Vitamin d  Pap smear as above with HPVHR   counseled on breast self exam,  mammography screening, adequate intake of calcium and vitamin D, diet and exercise  return annually or prn  An After Visit Summary was printed and given to the patient.

## 2015-10-26 NOTE — Patient Instructions (Signed)

## 2015-10-27 LAB — HEPATITIS C ANTIBODY: HCV Ab: NEGATIVE

## 2015-10-27 LAB — VITAMIN D 25 HYDROXY (VIT D DEFICIENCY, FRACTURES): Vit D, 25-Hydroxy: 39 ng/mL (ref 30–100)

## 2015-10-27 NOTE — Progress Notes (Signed)
Reviewed personally.  M. Suzanne Jaimeson Gopal, MD.  

## 2015-10-30 ENCOUNTER — Other Ambulatory Visit: Payer: Self-pay | Admitting: Emergency Medicine

## 2015-10-30 ENCOUNTER — Encounter: Payer: Self-pay | Admitting: Certified Nurse Midwife

## 2015-10-30 NOTE — Telephone Encounter (Signed)
Patient sent mychart message with request for refill of Synthroid to [facility] as her TSH results are back.

## 2015-11-01 ENCOUNTER — Other Ambulatory Visit: Payer: Self-pay | Admitting: Certified Nurse Midwife

## 2015-11-01 LAB — IPS PAP TEST WITH HPV

## 2015-11-01 MED ORDER — LEVOTHYROXINE SODIUM 75 MCG PO TABS: 75.0000 ug | ORAL_TABLET | Freq: Every day | ORAL | Status: DC

## 2015-11-01 MED FILL — LEVOTHYROXINE 75 MCG TABLET: 75 | 30 days supply | Qty: 30 | Fill #0

## 2015-11-01 NOTE — Telephone Encounter (Signed)
Refill encounter routed to Melvia Heaps CNM

## 2015-11-02 ENCOUNTER — Telehealth: Payer: Self-pay | Admitting: *Deleted

## 2015-11-02 ENCOUNTER — Encounter: Payer: Self-pay | Admitting: Allergy and Immunology

## 2015-11-02 NOTE — Telephone Encounter (Signed)
Error

## 2015-11-08 MED FILL — DOXEPIN 10 MG CAPSULE: 10 | 30 days supply | Qty: 30 | Fill #1

## 2015-11-14 MED FILL — *XOLAIR 150MG VIAL: 150 | 28 days supply | Qty: 2 | Fill #1

## 2015-11-20 ENCOUNTER — Ambulatory Visit (INDEPENDENT_AMBULATORY_CARE_PROVIDER_SITE_OTHER): Payer: 59 | Admitting: *Deleted

## 2015-11-20 DIAGNOSIS — L500 Allergic urticaria: Secondary | ICD-10-CM

## 2015-12-01 MED FILL — LEVOTHYROXINE 75 MCG TABLET: 75 | 30 days supply | Qty: 30 | Fill #1

## 2015-12-12 DIAGNOSIS — F411 Generalized anxiety disorder: Secondary | ICD-10-CM | POA: Diagnosis not present

## 2015-12-12 DIAGNOSIS — F320 Major depressive disorder, single episode, mild: Secondary | ICD-10-CM | POA: Diagnosis not present

## 2015-12-13 MED FILL — *XOLAIR 150MG VIAL: 150 | 28 days supply | Qty: 2 | Fill #2

## 2015-12-18 ENCOUNTER — Ambulatory Visit: Payer: 59

## 2015-12-18 ENCOUNTER — Ambulatory Visit (INDEPENDENT_AMBULATORY_CARE_PROVIDER_SITE_OTHER): Payer: 59

## 2015-12-18 DIAGNOSIS — L500 Allergic urticaria: Secondary | ICD-10-CM | POA: Diagnosis not present

## 2015-12-20 DIAGNOSIS — F411 Generalized anxiety disorder: Secondary | ICD-10-CM | POA: Diagnosis not present

## 2015-12-20 DIAGNOSIS — F320 Major depressive disorder, single episode, mild: Secondary | ICD-10-CM | POA: Diagnosis not present

## 2015-12-22 ENCOUNTER — Encounter: Payer: Self-pay | Admitting: Allergy and Immunology

## 2015-12-25 ENCOUNTER — Encounter: Payer: Self-pay | Admitting: Certified Nurse Midwife

## 2015-12-26 ENCOUNTER — Telehealth: Payer: Self-pay

## 2015-12-26 NOTE — Telephone Encounter (Signed)
Routing to Kem Boroughs, FNP for review and advise as Melvia Heaps CNM is out of the office this week.  Visit Follow-Up Question  Message [id number]  From Julie Yang To Julie Yang, CNM Sent 12/25/2015 11:50 AM  Hi - Dr. Hollice Espy recently prescribed me my thyroid medication in the generic form of Synthroid (I believe it's called Lovoythyrine?). used to be on the non-generic name brand Synthroid and since switching to the generic am noticing some increased hair loss. Nothing too drastic but it does remind me of the hair loss I experienced before starting Synthroid.    I remember my doctor in [place] saying she did not recommend the generic because she had not seen good results with it. I wonder if it might be worth having the pharmacy switch me to the non-generic synthroid instead? I know it will cost more but I would rather see it be effective.   Thanks!  Julie Yang   Responsible Party   Pool - [facility] Clinical Pool No one has taken responsibility for this message.  No actions have been taken on this message.

## 2015-12-26 NOTE — Telephone Encounter (Signed)
Telephone encounter created to discuss with Kem Boroughs, FNP as Melvia Heaps CNM is out of the office this week.

## 2015-12-27 MED ORDER — SYNTHROID 75 MCG PO TABS: 75.0000 ug | ORAL_TABLET | Freq: Every day | ORAL | 11 refills | Status: DC

## 2015-12-27 NOTE — Telephone Encounter (Signed)
Notified patient OK to change to brand Synthroid.  This is sent to [facility].  Lab appointment scheduled for 03/28/16 at 8:30am.

## 2015-12-27 NOTE — Telephone Encounter (Signed)
It is OK for her to get brand Synthroid X 3 months to see how she does.  Then to call back with how she feels - we may want to repeat TSH to see how her levels are.

## 2015-12-27 NOTE — Telephone Encounter (Signed)
Made in error

## 2016-01-05 MED FILL — SYNTHROID 75 MCG TABLET: 75 | 30 days supply | Qty: 30 | Fill #0

## 2016-01-16 MED FILL — *XOLAIR 150MG VIAL: 150 | 28 days supply | Qty: 2 | Fill #3

## 2016-01-19 ENCOUNTER — Ambulatory Visit (INDEPENDENT_AMBULATORY_CARE_PROVIDER_SITE_OTHER): Payer: 59

## 2016-01-19 DIAGNOSIS — L500 Allergic urticaria: Secondary | ICD-10-CM | POA: Diagnosis not present

## 2016-02-08 MED FILL — SYNTHROID 75 MCG TABLET: 75 | 30 days supply | Qty: 30 | Fill #1

## 2016-02-08 MED FILL — *XOLAIR 150MG VIAL: 150 | 28 days supply | Qty: 2 | Fill #4

## 2016-02-16 ENCOUNTER — Ambulatory Visit (INDEPENDENT_AMBULATORY_CARE_PROVIDER_SITE_OTHER): Payer: 59

## 2016-02-16 DIAGNOSIS — L501 Idiopathic urticaria: Secondary | ICD-10-CM | POA: Diagnosis not present

## 2016-02-16 DIAGNOSIS — L500 Allergic urticaria: Secondary | ICD-10-CM

## 2016-03-08 MED FILL — XOLAIR 150 MG SOLR: 150 | 28 days supply | Qty: 2 | Fill #5

## 2016-03-15 ENCOUNTER — Ambulatory Visit (INDEPENDENT_AMBULATORY_CARE_PROVIDER_SITE_OTHER): Payer: 59 | Admitting: *Deleted

## 2016-03-15 DIAGNOSIS — L500 Allergic urticaria: Secondary | ICD-10-CM

## 2016-03-15 MED FILL — SYNTHROID 75 MCG TABLET: 75 | 30 days supply | Qty: 30 | Fill #2

## 2016-03-28 ENCOUNTER — Other Ambulatory Visit (INDEPENDENT_AMBULATORY_CARE_PROVIDER_SITE_OTHER): Payer: 59

## 2016-03-28 DIAGNOSIS — Z0000 Encounter for general adult medical examination without abnormal findings: Secondary | ICD-10-CM

## 2016-03-28 DIAGNOSIS — E039 Hypothyroidism, unspecified: Secondary | ICD-10-CM | POA: Diagnosis not present

## 2016-03-29 ENCOUNTER — Other Ambulatory Visit: Payer: Self-pay | Admitting: Certified Nurse Midwife

## 2016-03-29 ENCOUNTER — Encounter: Payer: Self-pay | Admitting: Allergy and Immunology

## 2016-03-29 DIAGNOSIS — E039 Hypothyroidism, unspecified: Secondary | ICD-10-CM

## 2016-03-29 LAB — TSH: TSH: 2.63 mIU/L

## 2016-03-29 MED ORDER — SYNTHROID 75 MCG PO TABS: 75.0000 ug | ORAL_TABLET | Freq: Every day | ORAL | 12 refills | Status: DC

## 2016-04-02 ENCOUNTER — Encounter: Payer: Self-pay | Admitting: Allergy and Immunology

## 2016-04-02 ENCOUNTER — Ambulatory Visit (INDEPENDENT_AMBULATORY_CARE_PROVIDER_SITE_OTHER): Payer: 59 | Admitting: Allergy and Immunology

## 2016-04-02 VITALS — BP 116/70 | HR 76 | Resp 16 | Wt 206.4 lb

## 2016-04-02 DIAGNOSIS — T783XXD Angioneurotic edema, subsequent encounter: Secondary | ICD-10-CM

## 2016-04-02 DIAGNOSIS — T7840XD Allergy, unspecified, subsequent encounter: Secondary | ICD-10-CM | POA: Diagnosis not present

## 2016-04-02 DIAGNOSIS — L500 Allergic urticaria: Secondary | ICD-10-CM

## 2016-04-02 MED ORDER — PREDNISONE 1 MG PO TABS: 10.0000 mg | ORAL_TABLET | Freq: Every day | ORAL | Status: DC

## 2016-04-02 NOTE — Assessment & Plan Note (Signed)
Improved and well-controlled on current regimen.  Continue omalizumab injections as prescribed.  Continue H1/H2 receptor blockade, titrating to the lowest effective dose necessary to suppress urticaria.

## 2016-04-02 NOTE — Assessment & Plan Note (Signed)
Urticaria associated angioedema.  Well-controlled.  Treatment plan as outlined above.

## 2016-04-02 NOTE — Progress Notes (Signed)
    Follow-up Note  RE: Julie Yang MRN: [id number] DOB: [birth year] Date of Office Visit: 04/02/2016  Primary care provider: No PCP Per Patient Referring provider: No ref. provider found  History of present illness: Julie Yang is a 45 y.o. female with urticaria/angioedema presenting today for follow up.  She was previously seen in this office for her initial evaluation on 09/19/2015.  She reports that her symptoms improved significantly and have been well-controlled after having started omalizumab injections.  She receives Xolair injections once a month without complications.  She discontinued doxepin without recurrence of symptoms.  She is currently taking fexofenadine and levocetirizine and has not tried to taper off of these medications.   Assessment and plan: Chronic urticaria Improved and well-controlled on current regimen.  Continue omalizumab injections as prescribed.  Continue H1/H2 receptor blockade, titrating to the lowest effective dose necessary to suppress urticaria.  Angioedema Urticaria associated angioedema.  Well-controlled.  Treatment plan as outlined above.     Physical examination: Blood pressure 116/70, pulse 76, resp. rate 16, weight 206 lb 6.4 oz (93.6 kg). General: Alert, interactive, in no acute distress. HNeck: Supple without lymphadenopathy. Lungs: Clear to auscultation without wheezing, rhonchi or rales. CV: Normal S1, S2 without murmurs. Skin: Warm and dry, without lesions or rashes.  The following portions of the patient's history were reviewed and updated as appropriate: allergies, current medications, past family history, past medical history, past social history, past surgical history and problem list.    Medication List       Accurate as of 04/02/16  7:27 PM. Always use your most recent med list.          acetaminophen 325 MG tablet Commonly known as:  TYLENOL Take 650 mg by mouth every 6 (six) hours as needed for mild pain or  moderate pain. Reported on 10/26/2015   doxepin 10 MG capsule Commonly known as:  SINEQUAN Take 1 capsule (10 mg total) by mouth at bedtime as needed.   EPINEPHrine 0.3 mg/0.3 mL Soaj injection Commonly known as:  EPI-PEN USE AS DIRECTED FOR SEVERE ALLERGIC REACTION   fexofenadine 180 MG tablet Commonly known as:  ALLEGRA Take 1 tablet (180 mg total) by mouth daily.   levocetirizine 5 MG tablet Commonly known as:  XYZAL Levocetirizine (Xyzal) 5 mg twice a day  for 2 weeks if symptoms decrease then take Levocetirizine (Xyzal) 5 mg once a day.   omalizumab 150 MG injection Commonly known as:  XOLAIR Inject 300 mg into the skin every 28 (twenty-eight) days.   PROBIOTIC ADVANCED PO Take 1 capsule by mouth daily.   ranitidine 150 MG tablet Commonly known as:  ZANTAC Take 1 tablet (150 mg total) by mouth 2 (two) times daily.   SYNTHROID 75 MCG tablet Generic drug:  levothyroxine Take 1 tablet (75 mcg total) by mouth daily before breakfast.   Vitamin D 2000 units Caps Take 1 capsule by mouth daily.       Allergies  Allergen Reactions  . Aspirin Hives    Exacerbates hives  . Ibuprofen Hives    Exacerbates hives  . Iodine Hives    I appreciate the opportunity to take part in [place] care. Please do not hesitate to contact me with questions.  Sincerely,   R. Edgar Frisk, MD

## 2016-04-02 NOTE — Patient Instructions (Signed)
Chronic urticaria Improved and well-controlled on current regimen.  Continue omalizumab injections as prescribed.  Continue H1/H2 receptor blockade, titrating to the lowest effective dose necessary to suppress urticaria.  Angioedema Urticaria associated angioedema.  Well-controlled.  Treatment plan as outlined above.   Return in about 6 months (around 10/01/2016), or if symptoms worsen or fail to improve.

## 2016-04-05 MED FILL — XOLAIR 150 MG SOLR: 150 | 28 days supply | Qty: 2 | Fill #6

## 2016-04-12 ENCOUNTER — Ambulatory Visit (INDEPENDENT_AMBULATORY_CARE_PROVIDER_SITE_OTHER): Payer: 59 | Admitting: *Deleted

## 2016-04-12 DIAGNOSIS — L500 Allergic urticaria: Secondary | ICD-10-CM

## 2016-04-15 ENCOUNTER — Encounter: Payer: Self-pay | Admitting: *Deleted

## 2016-04-17 ENCOUNTER — Encounter: Payer: Self-pay | Admitting: *Deleted

## 2016-04-18 MED FILL — SYNTHROID 75 MCG TABLET: 75 | 30 days supply | Qty: 30 | Fill #3

## 2016-05-02 MED FILL — XOLAIR 150 MG SOLR: 150 | 28 days supply | Qty: 2 | Fill #7

## 2016-05-03 ENCOUNTER — Encounter: Payer: Self-pay | Admitting: Allergy and Immunology

## 2016-05-10 ENCOUNTER — Ambulatory Visit: Payer: 59

## 2016-05-10 ENCOUNTER — Ambulatory Visit (INDEPENDENT_AMBULATORY_CARE_PROVIDER_SITE_OTHER): Payer: 59

## 2016-05-10 DIAGNOSIS — L500 Allergic urticaria: Secondary | ICD-10-CM

## 2016-05-10 DIAGNOSIS — L501 Idiopathic urticaria: Secondary | ICD-10-CM | POA: Diagnosis not present

## 2016-05-13 ENCOUNTER — Other Ambulatory Visit: Payer: Self-pay | Admitting: Certified Nurse Midwife

## 2016-05-13 ENCOUNTER — Encounter: Payer: Self-pay | Admitting: *Deleted

## 2016-05-13 DIAGNOSIS — Z1231 Encounter for screening mammogram for malignant neoplasm of breast: Secondary | ICD-10-CM

## 2016-05-30 MED FILL — XOLAIR 150 MG SOLR: 150 | 28 days supply | Qty: 2 | Fill #8

## 2016-06-03 MED FILL — SYNTHROID 75 MCG TABLET: 75 | 30 days supply | Qty: 30 | Fill #4

## 2016-06-07 ENCOUNTER — Ambulatory Visit (INDEPENDENT_AMBULATORY_CARE_PROVIDER_SITE_OTHER): Payer: 59 | Admitting: *Deleted

## 2016-06-07 DIAGNOSIS — L500 Allergic urticaria: Secondary | ICD-10-CM

## 2016-06-13 ENCOUNTER — Telehealth: Payer: 59 | Admitting: Nurse Practitioner

## 2016-06-13 DIAGNOSIS — R6889 Other general symptoms and signs: Secondary | ICD-10-CM

## 2016-06-13 DIAGNOSIS — R51 Headache: Secondary | ICD-10-CM | POA: Diagnosis not present

## 2016-06-13 DIAGNOSIS — R509 Fever, unspecified: Secondary | ICD-10-CM | POA: Diagnosis not present

## 2016-06-13 DIAGNOSIS — R05 Cough: Secondary | ICD-10-CM | POA: Diagnosis not present

## 2016-06-13 MED ORDER — TAMIFLU 75 MG PO CAPS: 75.0000 mg | ORAL_CAPSULE | Freq: Two times a day (BID) | ORAL | 0 refills | Status: DC

## 2016-06-13 MED FILL — OSELTAMIVIR PHOSPHATE 75 MG: 75 | 5 days supply | Qty: 10 | Fill #0

## 2016-06-13 NOTE — Progress Notes (Signed)

## 2016-06-14 ENCOUNTER — Ambulatory Visit: Payer: 59

## 2016-06-17 ENCOUNTER — Ambulatory Visit (INDEPENDENT_AMBULATORY_CARE_PROVIDER_SITE_OTHER): Payer: 59 | Admitting: Allergy and Immunology

## 2016-06-17 VITALS — BP 132/86 | HR 76 | Resp 16

## 2016-06-17 DIAGNOSIS — L500 Allergic urticaria: Secondary | ICD-10-CM | POA: Diagnosis not present

## 2016-06-17 DIAGNOSIS — T783XXD Angioneurotic edema, subsequent encounter: Secondary | ICD-10-CM | POA: Diagnosis not present

## 2016-06-17 NOTE — Patient Instructions (Signed)
Chronic urticaria Well-controlled on current treatment plan.  Continue omalizumab injections as prescribed.  Continue H1/H2 receptor blockade, titrating to the lowest effective dose necessary to suppress urticaria.  Angioedema Urticaria associated angioedema, currently well-controlled.  Treatment plan as outlined above.   Return in about 6 months (around 12/15/2016), or if symptoms worsen or fail to improve.

## 2016-06-17 NOTE — Assessment & Plan Note (Signed)
Urticaria associated angioedema, currently well-controlled.  Treatment plan as outlined above.

## 2016-06-17 NOTE — Progress Notes (Signed)
    Follow-up Note  RE: Julie Yang MRN: [id number] DOB: 1971/04/04 Date of Office Visit: 06/17/2016  Primary care provider: No PCP Per Patient Referring provider: No ref. provider found  History of present illness: Julie Yang is a 46 y.o. female with chronic urticaria and angioedema presenting today for follow up.  She was last seen in this clinic in early December 2017.  She reports that in the interval since her previous visit her urticaria has been well-controlled while on Xolair therapy.  She notes she had the flu recently and developed hives during that time.  Otherwise, the urticaria has been well suppressed.   Assessment and plan: Chronic urticaria Well-controlled on current treatment plan.  Continue omalizumab injections as prescribed.  Continue H1/H2 receptor blockade, titrating to the lowest effective dose necessary to suppress urticaria.  Angioedema Urticaria associated angioedema, currently well-controlled.  Treatment plan as outlined above.   Physical examination: Blood pressure 132/86, pulse 76, resp. rate 16.  General: Alert, interactive, in no acute distress. Neck: Supple without lymphadenopathy. Lungs: Clear to auscultation without wheezing, rhonchi or rales. CV: Normal S1, S2 without murmurs. Skin: Warm and dry, without lesions or rashes.  The following portions of the patient's history were reviewed and updated as appropriate: allergies, current medications, past family history, past medical history, past social history, past surgical history and problem list.  Allergies as of 06/17/2016      Reactions   Aspirin Hives   Exacerbates hives   Ibuprofen Hives   Exacerbates hives   Iodine Hives      Medication List       Accurate as of 06/17/16  5:19 PM. Always use your most recent med list.          acetaminophen 325 MG tablet Commonly known as:  TYLENOL Take 650 mg by mouth every 6 (six) hours as needed for mild pain or moderate pain.  Reported on 10/26/2015   EPINEPHrine 0.3 mg/0.3 mL Soaj injection Commonly known as:  EPI-PEN USE AS DIRECTED FOR SEVERE ALLERGIC REACTION   levocetirizine 5 MG tablet Commonly known as:  XYZAL Levocetirizine (Xyzal) 5 mg twice a day  for 2 weeks if symptoms decrease then take Levocetirizine (Xyzal) 5 mg once a day.   omalizumab 150 MG injection Commonly known as:  XOLAIR Inject 300 mg into the skin every 28 (twenty-eight) days.   PROBIOTIC ADVANCED PO Take 1 capsule by mouth daily.   ranitidine 150 MG tablet Commonly known as:  ZANTAC Take 1 tablet (150 mg total) by mouth 2 (two) times daily.   SYNTHROID 75 MCG tablet Generic drug:  levothyroxine Take 1 tablet (75 mcg total) by mouth daily before breakfast.   Vitamin D 2000 units Caps Take 1 capsule by mouth daily.       Allergies  Allergen Reactions  . Aspirin Hives    Exacerbates hives  . Ibuprofen Hives    Exacerbates hives  . Iodine Hives    I appreciate the opportunity to take part in [place] care. Please do not hesitate to contact me with questions.  Sincerely,   R. Edgar Frisk, MD

## 2016-06-17 NOTE — Assessment & Plan Note (Signed)
Well-controlled on current treatment plan.  Continue omalizumab injections as prescribed.  Continue H1/H2 receptor blockade, titrating to the lowest effective dose necessary to suppress urticaria.

## 2016-06-25 ENCOUNTER — Ambulatory Visit
Admission: RE | Admit: 2016-06-25 | Discharge: 2016-06-25 | Disposition: A | Discharge status: Home / Self Care | Payer: 59 | Source: Ambulatory Visit | Attending: Certified Nurse Midwife | Admitting: Certified Nurse Midwife

## 2016-06-25 ENCOUNTER — Encounter: Payer: Self-pay | Admitting: Certified Nurse Midwife

## 2016-06-25 DIAGNOSIS — Z1231 Encounter for screening mammogram for malignant neoplasm of breast: Secondary | ICD-10-CM

## 2016-06-27 ENCOUNTER — Other Ambulatory Visit: Payer: Self-pay | Admitting: Certified Nurse Midwife

## 2016-06-27 DIAGNOSIS — R928 Other abnormal and inconclusive findings on diagnostic imaging of breast: Secondary | ICD-10-CM

## 2016-06-29 ENCOUNTER — Encounter: Payer: Self-pay | Admitting: Allergy and Immunology

## 2016-07-01 MED FILL — SYNTHROID 75 MCG TABLET: 75 | 30 days supply | Qty: 30 | Fill #5

## 2016-07-01 MED FILL — XOLAIR 150 MG SOLR: 150 | 28 days supply | Qty: 2 | Fill #9

## 2016-07-02 ENCOUNTER — Ambulatory Visit
Admission: RE | Admit: 2016-07-02 | Discharge: 2016-07-02 | Disposition: A | Discharge status: Home / Self Care | Payer: 59 | Source: Ambulatory Visit | Attending: Certified Nurse Midwife | Admitting: Certified Nurse Midwife

## 2016-07-02 ENCOUNTER — Other Ambulatory Visit: Payer: Self-pay | Admitting: Certified Nurse Midwife

## 2016-07-02 DIAGNOSIS — N6489 Other specified disorders of breast: Secondary | ICD-10-CM | POA: Diagnosis not present

## 2016-07-02 DIAGNOSIS — R928 Other abnormal and inconclusive findings on diagnostic imaging of breast: Secondary | ICD-10-CM

## 2016-07-05 ENCOUNTER — Ambulatory Visit (INDEPENDENT_AMBULATORY_CARE_PROVIDER_SITE_OTHER): Payer: 59

## 2016-07-05 ENCOUNTER — Ambulatory Visit: Payer: Self-pay

## 2016-07-05 DIAGNOSIS — L500 Allergic urticaria: Secondary | ICD-10-CM | POA: Diagnosis not present

## 2016-07-11 ENCOUNTER — Encounter: Payer: Self-pay | Admitting: Allergy and Immunology

## 2016-07-15 ENCOUNTER — Other Ambulatory Visit: Payer: Self-pay | Admitting: Allergy and Immunology

## 2016-07-15 ENCOUNTER — Encounter: Payer: Self-pay | Admitting: Allergy and Immunology

## 2016-07-15 ENCOUNTER — Ambulatory Visit (INDEPENDENT_AMBULATORY_CARE_PROVIDER_SITE_OTHER): Payer: 59 | Admitting: Allergy and Immunology

## 2016-07-15 VITALS — BP 120/70 | HR 84 | Resp 16

## 2016-07-15 DIAGNOSIS — L500 Allergic urticaria: Secondary | ICD-10-CM | POA: Diagnosis not present

## 2016-07-15 DIAGNOSIS — T783XXD Angioneurotic edema, subsequent encounter: Secondary | ICD-10-CM

## 2016-07-15 MED ORDER — DOXEPIN HCL 10 MG PO CAPS: 10.0000 mg | ORAL_CAPSULE | Freq: Every day | ORAL | 3 refills | Status: DC

## 2016-07-15 MED ORDER — PREDNISONE 10 MG (21) PO TBPK: ORAL_TABLET | ORAL | 0 refills | Status: DC

## 2016-07-15 MED FILL — DOXEPIN 10 MG CAPSULE: 10 | 30 days supply | Qty: 30 | Fill #0

## 2016-07-15 MED FILL — predniSONE 10 MG (21) TBPK: 10 | 6 days supply | Qty: 21 | Fill #0

## 2016-07-15 NOTE — Patient Instructions (Signed)
Chronic urticaria Exacerbation triggered by viral syndrome.    Prednisone has been provided, 40 mg x3 days, 20 mg x3 days, 10 mg x3 days, 5 mg x3days, then stop.  Continue omalizumab injections as prescribed.  Continue H1/H2 receptor blockade, titrating to the lowest effective dose necessary to suppress urticaria.  A refill prescription has been provided for doxepin 25 mg daily at bedtime as needed.  If symptoms persist or progress despite the treatment plan as outlined above, we will initiate treatment with sulfasalazine.  Angioedema  Treatment plan as outlined above.   Return in about 4 months (around 11/14/2016), or if symptoms worsen or fail to improve.

## 2016-07-15 NOTE — Assessment & Plan Note (Signed)
Exacerbation triggered by viral syndrome.    Prednisone has been provided, 40 mg x3 days, 20 mg x3 days, 10 mg x3 days, 5 mg x3days, then stop.  Continue omalizumab injections as prescribed.  Continue H1/H2 receptor blockade, titrating to the lowest effective dose necessary to suppress urticaria.  A refill prescription has been provided for doxepin 25 mg daily at bedtime as needed.  If symptoms persist or progress despite the treatment plan as outlined above, we will initiate treatment with sulfasalazine.

## 2016-07-15 NOTE — Assessment & Plan Note (Signed)
   Treatment plan as outlined above. 

## 2016-07-15 NOTE — Progress Notes (Signed)
Follow-up Note  RE: Julie Yang MRN: [id number] DOB: [birth year] Date of Office Visit: 07/15/2016  Primary care provider: No PCP Per Patient Referring provider: No ref. provider found  History of present illness: Julie Yang is a 46 y.o. female with chronic urticaria/angioedema presenting today for sick visit.  She was last seen in this clinic on 06/17/2016.  She reports that her urticaria and angioedema have been well-controlled with Xolair until she had the flu recently.  Since that time, her urticaria has been "out of control" despite full H1/H2 receptor blockade and Xolair injections.  In addition to the persistent urticaria, she had significant swelling of her lip last week.  She reports that she recently ran out of doxepin and has had difficulty sleeping at night because the pruritus.  She reports that she had been on sulfasalazine in the past with benefit and is willing to consider this option if needed.   Assessment and plan: Chronic urticaria Exacerbation triggered by viral syndrome.    Prednisone has been provided, 40 mg x3 days, 20 mg x3 days, 10 mg x3 days, 5 mg x3days, then stop.  Continue omalizumab injections as prescribed.  Continue H1/H2 receptor blockade, titrating to the lowest effective dose necessary to suppress urticaria.  A refill prescription has been provided for doxepin 25 mg daily at bedtime as needed.  If symptoms persist or progress despite the treatment plan as outlined above, we will initiate treatment with sulfasalazine.  Angioedema  Treatment plan as outlined above.   Meds ordered this encounter  Medications  . doxepin (SINEQUAN) 10 MG capsule    Sig: Take 1 capsule (10 mg total) by mouth at bedtime.    Dispense:  30 capsule    Refill:  3  . predniSONE (STERAPRED UNI-PAK 21 TAB) 10 MG (21) TBPK tablet    Sig: Take by mouth as directed.    Dispense:  21 tablet    Refill:  0    Physical examination: Blood pressure 120/70, pulse  84, resp. rate 16.  General: Alert, interactive, in no acute distress. Neck: Supple without lymphadenopathy. Lungs: Clear to auscultation without wheezing, rhonchi or rales. CV: Normal S1, S2 without murmurs. Skin: Scattered urticarial type lesions, nonvesicular.  The following portions of the patient's history were reviewed and updated as appropriate: allergies, current medications, past family history, past medical history, past social history, past surgical history and problem list.  Allergies as of 07/15/2016      Reactions   Aspirin Hives   Exacerbates hives   Ibuprofen Hives   Exacerbates hives   Iodine Hives      Medication List       Accurate as of 07/15/16  7:36 PM. Always use your most recent med list.          acetaminophen 325 MG tablet Commonly known as:  TYLENOL Take 650 mg by mouth every 6 (six) hours as needed for mild pain or moderate pain. Reported on 10/26/2015   doxepin 10 MG capsule Commonly known as:  SINEQUAN Take 1 capsule (10 mg total) by mouth at bedtime.   EPINEPHrine 0.3 mg/0.3 mL Soaj injection Commonly known as:  EPI-PEN USE AS DIRECTED FOR SEVERE ALLERGIC REACTION   fexofenadine 180 MG tablet Commonly known as:  ALLEGRA Take 180 mg by mouth 2 (two) times daily.   levocetirizine 5 MG tablet Commonly known as:  XYZAL Levocetirizine (Xyzal) 5 mg twice a day  for 2 weeks if symptoms decrease then take Levocetirizine (Xyzal) 5  mg once a day.   omalizumab 150 MG injection Commonly known as:  XOLAIR Inject 300 mg into the skin every 28 (twenty-eight) days.   predniSONE 10 MG (21) Tbpk tablet Commonly known as:  STERAPRED UNI-PAK 21 TAB Take by mouth as directed.   PROBIOTIC ADVANCED PO Take 1 capsule by mouth daily.   ranitidine 150 MG tablet Commonly known as:  ZANTAC Take 1 tablet (150 mg total) by mouth 2 (two) times daily.   SYNTHROID 75 MCG tablet Generic drug:  levothyroxine Take 1 tablet (75 mcg total) by mouth daily before  breakfast.   Vitamin D 2000 units Caps Take 1 capsule by mouth daily.       Allergies  Allergen Reactions  . Aspirin Hives    Exacerbates hives  . Ibuprofen Hives    Exacerbates hives  . Iodine Hives    I appreciate the opportunity to take part in [place] care. Please do not hesitate to contact me with questions.  Sincerely,   R. Edgar Frisk, MD

## 2016-07-17 ENCOUNTER — Telehealth: Payer: Self-pay | Admitting: Allergy and Immunology

## 2016-07-17 DIAGNOSIS — D1723 Benign lipomatous neoplasm of skin and subcutaneous tissue of right leg: Secondary | ICD-10-CM | POA: Diagnosis not present

## 2016-07-17 DIAGNOSIS — L501 Idiopathic urticaria: Secondary | ICD-10-CM

## 2016-07-17 MED ORDER — SULFASALAZINE 500 MG PO TABS: ORAL_TABLET | ORAL | 0 refills | Status: DC

## 2016-07-17 MED ORDER — SULFASALAZINE 500 MG PO TABS: 1000.0000 mg | ORAL_TABLET | Freq: Two times a day (BID) | ORAL | 3 refills | Status: DC

## 2016-07-17 MED FILL — sulfaSALAzine 500 MG TABS: 500 | 21 days supply | Qty: 35 | Fill #0

## 2016-07-17 NOTE — Telephone Encounter (Signed)
I spoke with the patient earlier today and sent through recommendations to start sulfasalazine and baseline lab work to the [facility] clinical staff.

## 2016-07-17 NOTE — Telephone Encounter (Signed)
I have informed patient of necessary labs and sent those to be signed. I have also sent in the prescription as specified by Dr. Verlin Fester. She is aware to wait until tomorrow so that the orders are signed and available for completion.

## 2016-07-17 NOTE — Telephone Encounter (Signed)
Noted. Thanks.

## 2016-07-17 NOTE — Telephone Encounter (Signed)
Patient called again asking where she is suppose to have her blood work done.

## 2016-07-17 NOTE — Telephone Encounter (Signed)
PER Dr. Verlin Fester:  We will need to start [facility] on sulfasalazine for refractory urticaria. She has benefited from this in the past.  She will need baseline labs including LFT, CBC with diff at baseline and then every 2 weeks for 3 months, then every month for 3 months, then every 3 months from there on out. She also needs a baseline BUN/Cr and urinalysis with microscopic exam.  Start sulfasalazine at 500mg  daily x 1 week, then increase to 500mg  bid x 1 week, then 1000mg  daily x 1 week, then 1000mg  bid from there on out.  Thanks.

## 2016-07-17 NOTE — Telephone Encounter (Signed)
Patient was seen by Dr. Verlin Fester 07-15-16 for hives. He prescribed prednisone and she has been taking it but says her hives are worse and covering 3/4 of her body. She wants to know if they have to get worse before they get better.

## 2016-07-18 DIAGNOSIS — L501 Idiopathic urticaria: Secondary | ICD-10-CM | POA: Diagnosis not present

## 2016-07-18 LAB — HEPATIC FUNCTION PANEL
ALT: 10 U/L (ref 6–29)
AST: 18 U/L (ref 10–30)
Albumin: 4.3 g/dL (ref 3.6–5.1)
Alkaline Phosphatase: 60 U/L (ref 33–115)
Bilirubin, Direct: 0.1 mg/dL (ref <=0.2)
Indirect Bilirubin: 0.3 mg/dL (ref 0.2–1.2)
Total Bilirubin: 0.4 mg/dL (ref 0.2–1.2)
Total Protein: 7.1 g/dL (ref 6.1–8.1)

## 2016-07-18 LAB — CBC WITH DIFFERENTIAL/PLATELET
Basophils Absolute: 0 cells/uL (ref 0–200)
Basophils Relative: 0 %
Eosinophils Absolute: 128 cells/uL (ref 15–500)
Eosinophils Relative: 1 %
HCT: 42 % (ref 35.0–45.0)
Hemoglobin: 14 g/dL (ref 11.7–15.5)
Lymphocytes Relative: 26 %
Lymphs Abs: 3328 cells/uL (ref 850–3900)
MCH: 31.6 pg (ref 27.0–33.0)
MCHC: 33.3 g/dL (ref 32.0–36.0)
MCV: 94.8 fL (ref 80.0–100.0)
MPV: 8.9 fL (ref 7.5–12.5)
Monocytes Absolute: 512 cells/uL (ref 200–950)
Monocytes Relative: 4 %
Neutro Abs: 8832 cells/uL — ABNORMAL HIGH (ref 1500–7800)
Neutrophils Relative %: 69 %
Platelets: 416 10*3/uL — ABNORMAL HIGH (ref 140–400)
RBC: 4.43 MIL/uL (ref 3.80–5.10)
RDW: 12.7 % (ref 11.0–15.0)
WBC: 12.8 10*3/uL — ABNORMAL HIGH (ref 3.8–10.8)

## 2016-07-18 LAB — BUN/CREATININE RATIO
BUN/Creatinine Ratio: 17.1 Ratio (ref 6–22)
BUN: 12 mg/dL (ref 7–25)
Creat: 0.7 mg/dL (ref 0.50–1.10)
GFR, Est African American: >89 mL/min (ref >=60)
GFR, Est Non African American: >89 mL/min (ref >=60)

## 2016-07-19 LAB — URINALYSIS, ROUTINE W REFLEX MICROSCOPIC
Bilirubin Urine: NEGATIVE
Glucose, UA: NEGATIVE
Hgb urine dipstick: NEGATIVE
Leukocytes, UA: NEGATIVE
Nitrite: NEGATIVE
Protein, ur: NEGATIVE
Specific Gravity, Urine: 1.013 (ref 1.001–1.035)
pH: 5.5 (ref 5.0–8.0)

## 2016-07-24 MED FILL — XOLAIR 150 MG SOLR: 150 | 28 days supply | Qty: 2 | Fill #10

## 2016-07-25 MED FILL — SYNTHROID 75 MCG TABLET: 75 | 30 days supply | Qty: 30 | Fill #6

## 2016-07-30 ENCOUNTER — Ambulatory Visit (INDEPENDENT_AMBULATORY_CARE_PROVIDER_SITE_OTHER): Payer: 59 | Admitting: *Deleted

## 2016-07-30 DIAGNOSIS — L501 Idiopathic urticaria: Secondary | ICD-10-CM | POA: Diagnosis not present

## 2016-07-31 ENCOUNTER — Other Ambulatory Visit: Payer: Self-pay | Admitting: Allergy and Immunology

## 2016-07-31 DIAGNOSIS — L501 Idiopathic urticaria: Secondary | ICD-10-CM | POA: Diagnosis not present

## 2016-07-31 LAB — CBC WITH DIFFERENTIAL/PLATELET
Basophils Absolute: 0 cells/uL (ref 0–200)
Basophils Relative: 0 %
Eosinophils Absolute: 216 cells/uL (ref 15–500)
Eosinophils Relative: 2 %
HCT: 42.2 % (ref 35.0–45.0)
Hemoglobin: 13.9 g/dL (ref 11.7–15.5)
Lymphocytes Relative: 23 %
Lymphs Abs: 2484 cells/uL (ref 850–3900)
MCH: 31 pg (ref 27.0–33.0)
MCHC: 32.9 g/dL (ref 32.0–36.0)
MCV: 94 fL (ref 80.0–100.0)
MPV: 9.1 fL (ref 7.5–12.5)
Monocytes Absolute: 864 cells/uL (ref 200–950)
Monocytes Relative: 8 %
Neutro Abs: 7236 cells/uL (ref 1500–7800)
Neutrophils Relative %: 67 %
Platelets: 395 10*3/uL (ref 140–400)
RBC: 4.49 MIL/uL (ref 3.80–5.10)
RDW: 13 % (ref 11.0–15.0)
WBC: 10.8 10*3/uL (ref 3.8–10.8)

## 2016-07-31 LAB — HEPATIC FUNCTION PANEL
ALT: 15 U/L (ref 6–29)
AST: 17 U/L (ref 10–30)
Albumin: 4.1 g/dL (ref 3.6–5.1)
Alkaline Phosphatase: 64 U/L (ref 33–115)
Bilirubin, Direct: 0.1 mg/dL (ref <=0.2)
Indirect Bilirubin: 0.4 mg/dL (ref 0.2–1.2)
Total Bilirubin: 0.5 mg/dL (ref 0.2–1.2)
Total Protein: 7.1 g/dL (ref 6.1–8.1)

## 2016-07-31 LAB — URINALYSIS, ROUTINE W REFLEX MICROSCOPIC
Bilirubin Urine: NEGATIVE
Glucose, UA: NEGATIVE
Hgb urine dipstick: NEGATIVE
Ketones, ur: NEGATIVE
Nitrite: NEGATIVE
Protein, ur: NEGATIVE
Specific Gravity, Urine: 1.005 (ref 1.001–1.035)
pH: 7 (ref 5.0–8.0)

## 2016-07-31 LAB — BUN/CREATININE RATIO
BUN/Creatinine Ratio: 12.5 Ratio (ref 6–22)
BUN: 9 mg/dL (ref 7–25)
Creat: 0.72 mg/dL (ref 0.50–1.10)
GFR, Est African American: >89 mL/min (ref >=60)
GFR, Est Non African American: >89 mL/min (ref >=60)

## 2016-07-31 LAB — URINALYSIS, MICROSCOPIC ONLY
Bacteria, UA: NONE SEEN [HPF]
Casts: NONE SEEN [LPF]
Crystals: NONE SEEN [HPF]
RBC / HPF: NONE SEEN RBC/HPF (ref <=2)
Yeast: NONE SEEN [HPF]

## 2016-08-02 MED FILL — sulfaSALAzine 500 MG TABS: 500 | 30 days supply | Qty: 120 | Fill #0

## 2016-08-05 ENCOUNTER — Encounter: Payer: Self-pay | Admitting: Allergy and Immunology

## 2016-08-05 ENCOUNTER — Ambulatory Visit (INDEPENDENT_AMBULATORY_CARE_PROVIDER_SITE_OTHER): Payer: 59 | Admitting: Allergy and Immunology

## 2016-08-05 DIAGNOSIS — L500 Allergic urticaria: Secondary | ICD-10-CM

## 2016-08-05 NOTE — Progress Notes (Signed)
Follow-up Note  RE: Julie Yang MRN: [id number] DOB: [birth year] Date of Office Visit: 08/05/2016  Primary care provider: No PCP Per Patient Referring provider: No ref. provider found  History of present illness: Julie Yang is a 46 y.o. female with chronic urticaria/angioedema presenting today for follow up.  She was last seen in this clinic on 07/15/2016.  She reports that after having started sulfasalazine, the hives of gradually diminished in frequency and severity.  Today was the first hive-free day in a long time.  She is hoping to be able to taper some of her other urticaria medications which she has been requiring daily to suppress symptoms.   Assessment and plan: Chronic urticaria Improved on current treatment plan.  Continue sulfasalazine buildup dose as prescribed.  Appropriate labs will be monitored.  For now, continue omalizumab and H1/H2 receptor blockade, titrating to the lowest effective dose necessary to suppress urticaria.  Physical examination: Blood pressure 118/76, pulse 94, temperature 98.4 F (36.9 C), temperature source Oral, resp. rate 18, height 5' 2.75" (1.594 m), weight 201 lb 12.8 oz (91.5 kg), SpO2 99 %.  General: Alert, interactive, in no acute distress. Neck: Supple without lymphadenopathy. Lungs: Clear to auscultation without wheezing, rhonchi or rales. CV: Normal S1, S2 without murmurs. Skin: Warm and dry, without lesions or rashes.  The following portions of the patient's history were reviewed and updated as appropriate: allergies, current medications, past family history, past medical history, past social history, past surgical history and problem list.  Allergies as of 08/05/2016      Reactions   Aspirin Hives   Exacerbates hives   Ibuprofen Hives   Exacerbates hives   Iodine Hives      Medication List       Accurate as of 08/05/16  5:37 PM. Always use your most recent med list.          acetaminophen 325 MG  tablet Commonly known as:  TYLENOL Take 650 mg by mouth every 6 (six) hours as needed for mild pain or moderate pain. Reported on 10/26/2015   doxepin 10 MG capsule Commonly known as:  SINEQUAN Take 1 capsule (10 mg total) by mouth at bedtime.   EPINEPHrine 0.3 mg/0.3 mL Soaj injection Commonly known as:  EPI-PEN USE AS DIRECTED FOR SEVERE ALLERGIC REACTION   fexofenadine 180 MG tablet Commonly known as:  ALLEGRA Take 180 mg by mouth 2 (two) times daily.   levocetirizine 5 MG tablet Commonly known as:  XYZAL Levocetirizine (Xyzal) 5 mg twice a day  for 2 weeks if symptoms decrease then take Levocetirizine (Xyzal) 5 mg once a day.   omalizumab 150 MG injection Commonly known as:  XOLAIR Inject 300 mg into the skin every 28 (twenty-eight) days.   PROBIOTIC ADVANCED PO Take 1 capsule by mouth daily.   ranitidine 150 MG tablet Commonly known as:  ZANTAC Take 1 tablet (150 mg total) by mouth 2 (two) times daily.   sulfaSALAzine 500 MG tablet Commonly known as:  AZULFIDINE Take 2 tablets (1,000 mg total) by mouth 2 (two) times daily.   SYNTHROID 75 MCG tablet Generic drug:  levothyroxine Take 1 tablet (75 mcg total) by mouth daily before breakfast.   Vitamin D 2000 units Caps Take 1 capsule by mouth daily.       Allergies  Allergen Reactions  . Aspirin Hives    Exacerbates hives  . Ibuprofen Hives    Exacerbates hives  . Iodine Hives    I appreciate the  opportunity to take part in [place] care. Please do not hesitate to contact me with questions.  Sincerely,   R. Edgar Frisk, MD

## 2016-08-05 NOTE — Assessment & Plan Note (Signed)
Improved on current treatment plan.  Continue sulfasalazine buildup dose as prescribed.  Appropriate labs will be monitored.  For now, continue omalizumab and H1/H2 receptor blockade, titrating to the lowest effective dose necessary to suppress urticaria.

## 2016-08-05 NOTE — Patient Instructions (Signed)
Chronic urticaria Improved on current treatment plan.  Continue sulfasalazine buildup dose as prescribed.  Appropriate labs will be monitored.  For now, continue omalizumab and H1/H2 receptor blockade, titrating to the lowest effective dose necessary to suppress urticaria.   Return in about 4 months (around 12/05/2016), or if symptoms worsen or fail to improve.

## 2016-08-14 MED FILL — DOXEPIN 10 MG CAPSULE: 10 | 30 days supply | Qty: 30 | Fill #1

## 2016-08-15 ENCOUNTER — Other Ambulatory Visit: Payer: Self-pay | Admitting: Allergy and Immunology

## 2016-08-15 DIAGNOSIS — L501 Idiopathic urticaria: Secondary | ICD-10-CM | POA: Diagnosis not present

## 2016-08-15 LAB — URINALYSIS, ROUTINE W REFLEX MICROSCOPIC
Bilirubin Urine: NEGATIVE
Glucose, UA: NEGATIVE
Hgb urine dipstick: NEGATIVE
Ketones, ur: NEGATIVE
Leukocytes, UA: NEGATIVE
Nitrite: NEGATIVE
Protein, ur: NEGATIVE
Specific Gravity, Urine: 1.005 (ref 1.001–1.035)
pH: 6.5 (ref 5.0–8.0)

## 2016-08-15 LAB — CBC WITH DIFFERENTIAL/PLATELET
Basophils Absolute: 0 cells/uL (ref 0–200)
Basophils Relative: 0 %
Eosinophils Absolute: 231 cells/uL (ref 15–500)
Eosinophils Relative: 3 %
HCT: 40.4 % (ref 35.0–45.0)
Hemoglobin: 13 g/dL (ref 11.7–15.5)
Lymphocytes Relative: 25 %
Lymphs Abs: 1925 cells/uL (ref 850–3900)
MCH: 30.7 pg (ref 27.0–33.0)
MCHC: 32.2 g/dL (ref 32.0–36.0)
MCV: 95.3 fL (ref 80.0–100.0)
MPV: 9.1 fL (ref 7.5–12.5)
Monocytes Absolute: 693 cells/uL (ref 200–950)
Monocytes Relative: 9 %
Neutro Abs: 4851 cells/uL (ref 1500–7800)
Neutrophils Relative %: 63 %
Platelets: 379 10*3/uL (ref 140–400)
RBC: 4.24 MIL/uL (ref 3.80–5.10)
RDW: 13.2 % (ref 11.0–15.0)
WBC: 7.7 10*3/uL (ref 3.8–10.8)

## 2016-08-15 LAB — HEPATIC FUNCTION PANEL
ALT: 12 U/L (ref 6–29)
AST: 15 U/L (ref 10–30)
Albumin: 4 g/dL (ref 3.6–5.1)
Alkaline Phosphatase: 56 U/L (ref 33–115)
Bilirubin, Direct: 0.1 mg/dL (ref <=0.2)
Indirect Bilirubin: 0.3 mg/dL (ref 0.2–1.2)
Total Bilirubin: 0.4 mg/dL (ref 0.2–1.2)
Total Protein: 6.7 g/dL (ref 6.1–8.1)

## 2016-08-15 LAB — BUN/CREATININE RATIO
BUN/Creatinine Ratio: 16.2 Ratio (ref 6–22)
BUN: 12 mg/dL (ref 7–25)
Creat: 0.74 mg/dL (ref 0.50–1.10)
GFR, Est African American: >89 mL/min (ref >=60)
GFR, Est Non African American: >89 mL/min (ref >=60)

## 2016-08-20 MED FILL — XOLAIR 150 MG SOLR: 150 | 28 days supply | Qty: 2 | Fill #11

## 2016-08-27 ENCOUNTER — Ambulatory Visit (INDEPENDENT_AMBULATORY_CARE_PROVIDER_SITE_OTHER): Payer: 59 | Admitting: *Deleted

## 2016-08-27 DIAGNOSIS — L501 Idiopathic urticaria: Secondary | ICD-10-CM | POA: Diagnosis not present

## 2016-08-29 ENCOUNTER — Other Ambulatory Visit: Payer: Self-pay | Admitting: Allergy and Immunology

## 2016-08-29 DIAGNOSIS — L501 Idiopathic urticaria: Secondary | ICD-10-CM | POA: Diagnosis not present

## 2016-08-29 LAB — URINALYSIS, ROUTINE W REFLEX MICROSCOPIC
Bilirubin Urine: NEGATIVE
Glucose, UA: NEGATIVE
Hgb urine dipstick: NEGATIVE
Ketones, ur: NEGATIVE
Leukocytes, UA: NEGATIVE
Nitrite: NEGATIVE
Protein, ur: NEGATIVE
Specific Gravity, Urine: 1.016 (ref 1.001–1.035)
pH: 5.5 (ref 5.0–8.0)

## 2016-08-29 LAB — CBC WITH DIFFERENTIAL/PLATELET
Basophils Absolute: 0 {cells}/uL (ref 0–200)
Basophils Relative: 0 %
Eosinophils Absolute: 180 {cells}/uL (ref 15–500)
Eosinophils Relative: 2 %
HCT: 41.8 % (ref 35.0–45.0)
Hemoglobin: 13.7 g/dL (ref 11.7–15.5)
Lymphocytes Relative: 29 %
Lymphs Abs: 2610 {cells}/uL (ref 850–3900)
MCH: 31.6 pg (ref 27.0–33.0)
MCHC: 32.8 g/dL (ref 32.0–36.0)
MCV: 96.5 fL (ref 80.0–100.0)
MPV: 9 fL (ref 7.5–12.5)
Monocytes Absolute: 900 {cells}/uL (ref 200–950)
Monocytes Relative: 10 %
Neutro Abs: 5310 {cells}/uL (ref 1500–7800)
Neutrophils Relative %: 59 %
Platelets: 337 K/uL (ref 140–400)
RBC: 4.33 MIL/uL (ref 3.80–5.10)
RDW: 13.2 % (ref 11.0–15.0)
WBC: 9 K/uL (ref 3.8–10.8)

## 2016-08-29 LAB — HEPATIC FUNCTION PANEL
ALT: 9 U/L (ref 6–29)
AST: 13 U/L (ref 10–30)
Albumin: 4.2 g/dL (ref 3.6–5.1)
Alkaline Phosphatase: 59 U/L (ref 33–115)
Bilirubin, Direct: 0.1 mg/dL (ref <=0.2)
Indirect Bilirubin: 0.5 mg/dL (ref 0.2–1.2)
Total Bilirubin: 0.6 mg/dL (ref 0.2–1.2)
Total Protein: 7.1 g/dL (ref 6.1–8.1)

## 2016-08-29 LAB — BUN/CREATININE RATIO
BUN/Creatinine Ratio: 22.1 Ratio — ABNORMAL HIGH (ref 6–22)
BUN: 15 mg/dL (ref 7–25)
Creat: 0.68 mg/dL (ref 0.50–1.10)
GFR, Est African American: >89 mL/min (ref >=60)
GFR, Est Non African American: >89 mL/min (ref >=60)

## 2016-09-02 MED FILL — sulfaSALAzine 500 MG TABS: 500 | 30 days supply | Qty: 120 | Fill #1

## 2016-09-02 MED FILL — SYNTHROID 75 MCG TABLET: 75 | 30 days supply | Qty: 30 | Fill #7

## 2016-09-12 ENCOUNTER — Other Ambulatory Visit: Payer: Self-pay | Admitting: Allergy and Immunology

## 2016-09-12 DIAGNOSIS — L501 Idiopathic urticaria: Secondary | ICD-10-CM | POA: Diagnosis not present

## 2016-09-12 LAB — BUN/CREATININE RATIO
BUN/Creatinine Ratio: 16.9 Ratio (ref 6–22)
BUN: 12 mg/dL (ref 7–25)
Creat: 0.71 mg/dL (ref 0.50–1.10)
GFR, Est African American: >89 mL/min (ref >=60)
GFR, Est Non African American: >89 mL/min (ref >=60)

## 2016-09-12 LAB — CBC WITH DIFFERENTIAL/PLATELET
Basophils Absolute: 0 cells/uL (ref 0–200)
Basophils Relative: 0 %
Eosinophils Absolute: 219 cells/uL (ref 15–500)
Eosinophils Relative: 3 %
HCT: 39.7 % (ref 35.0–45.0)
Hemoglobin: 12.9 g/dL (ref 11.7–15.5)
Lymphocytes Relative: 29 %
Lymphs Abs: 2117 cells/uL (ref 850–3900)
MCH: 31.5 pg (ref 27.0–33.0)
MCHC: 32.5 g/dL (ref 32.0–36.0)
MCV: 96.8 fL (ref 80.0–100.0)
MPV: 8.8 fL (ref 7.5–12.5)
Monocytes Absolute: 803 cells/uL (ref 200–950)
Monocytes Relative: 11 %
Neutro Abs: 4161 cells/uL (ref 1500–7800)
Neutrophils Relative %: 57 %
Platelets: 352 10*3/uL (ref 140–400)
RBC: 4.1 MIL/uL (ref 3.80–5.10)
RDW: 13.3 % (ref 11.0–15.0)
WBC: 7.3 10*3/uL (ref 3.8–10.8)

## 2016-09-12 LAB — URINALYSIS, ROUTINE W REFLEX MICROSCOPIC
Bilirubin Urine: NEGATIVE
Glucose, UA: NEGATIVE
Hgb urine dipstick: NEGATIVE
Ketones, ur: NEGATIVE
Leukocytes, UA: NEGATIVE
Nitrite: NEGATIVE
Protein, ur: NEGATIVE
Specific Gravity, Urine: 1.013 (ref 1.001–1.035)
pH: 5.5 (ref 5.0–8.0)

## 2016-09-12 LAB — HEPATIC FUNCTION PANEL
ALT: 11 U/L (ref 6–29)
AST: 13 U/L (ref 10–30)
Albumin: 4.2 g/dL (ref 3.6–5.1)
Alkaline Phosphatase: 61 U/L (ref 33–115)
Bilirubin, Direct: 0.1 mg/dL (ref <=0.2)
Indirect Bilirubin: 0.3 mg/dL (ref 0.2–1.2)
Total Bilirubin: 0.4 mg/dL (ref 0.2–1.2)
Total Protein: 6.7 g/dL (ref 6.1–8.1)

## 2016-09-17 ENCOUNTER — Encounter: Payer: Self-pay | Admitting: Allergy and Immunology

## 2016-09-17 ENCOUNTER — Other Ambulatory Visit: Payer: Self-pay | Admitting: *Deleted

## 2016-09-17 ENCOUNTER — Other Ambulatory Visit: Payer: Self-pay | Admitting: Internal Medicine

## 2016-09-17 DIAGNOSIS — L501 Idiopathic urticaria: Secondary | ICD-10-CM

## 2016-09-17 MED ORDER — OMALIZUMAB 150 MG ~~LOC~~ SOLR: 300.0000 mg | SUBCUTANEOUS | 11 refills | Status: DC

## 2016-09-17 MED FILL — XOLAIR 150 MG SOLR: 150 | 28 days supply | Qty: 2 | Fill #0

## 2016-09-19 ENCOUNTER — Other Ambulatory Visit: Payer: Self-pay | Admitting: *Deleted

## 2016-09-19 DIAGNOSIS — L501 Idiopathic urticaria: Secondary | ICD-10-CM

## 2016-09-19 MED ORDER — OMALIZUMAB 150 MG ~~LOC~~ SOLR: 300.0000 mg | SUBCUTANEOUS | 11 refills | Status: DC

## 2016-09-19 NOTE — Telephone Encounter (Signed)
Sent in refill for Xolair.

## 2016-09-24 ENCOUNTER — Ambulatory Visit (INDEPENDENT_AMBULATORY_CARE_PROVIDER_SITE_OTHER): Payer: 59

## 2016-09-24 DIAGNOSIS — L501 Idiopathic urticaria: Secondary | ICD-10-CM

## 2016-09-26 DIAGNOSIS — F330 Major depressive disorder, recurrent, mild: Secondary | ICD-10-CM | POA: Diagnosis not present

## 2016-09-27 ENCOUNTER — Other Ambulatory Visit: Payer: Self-pay | Admitting: Allergy and Immunology

## 2016-09-27 DIAGNOSIS — L501 Idiopathic urticaria: Secondary | ICD-10-CM | POA: Diagnosis not present

## 2016-09-27 LAB — CBC WITH DIFFERENTIAL/PLATELET
Basophils Absolute: 0 cells/uL (ref 0–200)
Basophils Relative: 0 %
Eosinophils Absolute: 168 cells/uL (ref 15–500)
Eosinophils Relative: 2 %
HCT: 41 % (ref 35.0–45.0)
Hemoglobin: 13 g/dL (ref 11.7–15.5)
Lymphocytes Relative: 28 %
Lymphs Abs: 2352 cells/uL (ref 850–3900)
MCH: 30.7 pg (ref 27.0–33.0)
MCHC: 31.7 g/dL — ABNORMAL LOW (ref 32.0–36.0)
MCV: 96.7 fL (ref 80.0–100.0)
MPV: 9.1 fL (ref 7.5–12.5)
Monocytes Absolute: 840 cells/uL (ref 200–950)
Monocytes Relative: 10 %
Neutro Abs: 5040 cells/uL (ref 1500–7800)
Neutrophils Relative %: 60 %
Platelets: 348 10*3/uL (ref 140–400)
RBC: 4.24 MIL/uL (ref 3.80–5.10)
RDW: 13.3 % (ref 11.0–15.0)
WBC: 8.4 10*3/uL (ref 3.8–10.8)

## 2016-09-27 LAB — HEPATIC FUNCTION PANEL
ALT: 9 U/L (ref 6–29)
AST: 13 U/L (ref 10–30)
Albumin: 4.1 g/dL (ref 3.6–5.1)
Alkaline Phosphatase: 54 U/L (ref 33–115)
Bilirubin, Direct: 0.1 mg/dL (ref <=0.2)
Indirect Bilirubin: 0.4 mg/dL (ref 0.2–1.2)
Total Bilirubin: 0.5 mg/dL (ref 0.2–1.2)
Total Protein: 6.9 g/dL (ref 6.1–8.1)

## 2016-09-27 LAB — URINALYSIS, ROUTINE W REFLEX MICROSCOPIC
Bilirubin Urine: NEGATIVE
Glucose, UA: NEGATIVE
Hgb urine dipstick: NEGATIVE
Ketones, ur: NEGATIVE
Leukocytes, UA: NEGATIVE
Nitrite: NEGATIVE
Protein, ur: NEGATIVE
Specific Gravity, Urine: 1.02 (ref 1.001–1.035)
pH: 5 (ref 5.0–8.0)

## 2016-09-27 LAB — BUN/CREATININE RATIO
BUN/Creatinine Ratio: 16.7 Ratio (ref 6–22)
BUN: 13 mg/dL (ref 7–25)
Creat: 0.78 mg/dL (ref 0.50–1.10)
GFR, Est African American: >89 mL/min (ref >=60)
GFR, Est Non African American: >89 mL/min (ref >=60)

## 2016-10-02 MED FILL — sulfaSALAzine 500 MG TABS: 500 | 30 days supply | Qty: 120 | Fill #2

## 2016-10-02 MED FILL — SYNTHROID 75 MCG TABLET: 75 | 30 days supply | Qty: 30 | Fill #8

## 2016-10-09 DIAGNOSIS — H5213 Myopia, bilateral: Secondary | ICD-10-CM | POA: Diagnosis not present

## 2016-10-11 ENCOUNTER — Other Ambulatory Visit: Payer: Self-pay | Admitting: Allergy and Immunology

## 2016-10-11 DIAGNOSIS — L501 Idiopathic urticaria: Secondary | ICD-10-CM | POA: Diagnosis not present

## 2016-10-11 LAB — BUN/CREATININE RATIO
BUN/Creatinine Ratio: 19.4 Ratio (ref 6–22)
BUN: 14 mg/dL (ref 7–25)
Creat: 0.72 mg/dL (ref 0.50–1.10)
GFR, Est African American: >89 mL/min (ref >=60)
GFR, Est Non African American: >89 mL/min (ref >=60)

## 2016-10-11 LAB — CBC WITH DIFFERENTIAL/PLATELET
Basophils Absolute: 0 {cells}/uL (ref 0–200)
Basophils Relative: 0 %
Eosinophils Absolute: 180 {cells}/uL (ref 15–500)
Eosinophils Relative: 2 %
HCT: 39.4 % (ref 35.0–45.0)
Hemoglobin: 12.5 g/dL (ref 11.7–15.5)
Lymphocytes Relative: 29 %
Lymphs Abs: 2610 {cells}/uL (ref 850–3900)
MCH: 31 pg (ref 27.0–33.0)
MCHC: 31.7 g/dL — ABNORMAL LOW (ref 32.0–36.0)
MCV: 97.8 fL (ref 80.0–100.0)
MPV: 9.1 fL (ref 7.5–12.5)
Monocytes Absolute: 810 {cells}/uL (ref 200–950)
Monocytes Relative: 9 %
Neutro Abs: 5400 {cells}/uL (ref 1500–7800)
Neutrophils Relative %: 60 %
Platelets: 379 K/uL (ref 140–400)
RBC: 4.03 MIL/uL (ref 3.80–5.10)
RDW: 12.9 % (ref 11.0–15.0)
WBC: 9 K/uL (ref 3.8–10.8)

## 2016-10-11 LAB — HEPATIC FUNCTION PANEL
ALT: 12 U/L (ref 6–29)
AST: 14 U/L (ref 10–30)
Albumin: 4 g/dL (ref 3.6–5.1)
Alkaline Phosphatase: 59 U/L (ref 33–115)
Bilirubin, Direct: 0.1 mg/dL
Indirect Bilirubin: 0.3 mg/dL (ref 0.2–1.2)
Total Bilirubin: 0.4 mg/dL (ref 0.2–1.2)
Total Protein: 6.4 g/dL (ref 6.1–8.1)

## 2016-10-12 LAB — URINALYSIS, ROUTINE W REFLEX MICROSCOPIC
Bilirubin Urine: NEGATIVE
Glucose, UA: NEGATIVE
Hgb urine dipstick: NEGATIVE
Ketones, ur: NEGATIVE
Leukocytes, UA: NEGATIVE
Nitrite: NEGATIVE
Protein, ur: NEGATIVE
Specific Gravity, Urine: 1.023 (ref 1.001–1.035)
pH: 5 (ref 5.0–8.0)

## 2016-10-15 MED FILL — XOLAIR 150 MG SOLR: 150 | 28 days supply | Qty: 2 | Fill #1

## 2016-10-16 ENCOUNTER — Telehealth: Payer: Self-pay | Admitting: *Deleted

## 2016-10-16 NOTE — Telephone Encounter (Signed)
Tammy patient called states he rx for Xolair has ran out and will need a new rx. Please advise

## 2016-10-16 NOTE — Telephone Encounter (Signed)
I called patient and advised her I already sent rx in to [facility] on 5/24. She had been trying to refill her script that had run out instead of calling and get the new rx filled

## 2016-10-17 NOTE — Telephone Encounter (Signed)
Noted  

## 2016-10-22 ENCOUNTER — Ambulatory Visit (INDEPENDENT_AMBULATORY_CARE_PROVIDER_SITE_OTHER): Payer: 59 | Admitting: *Deleted

## 2016-10-22 DIAGNOSIS — L501 Idiopathic urticaria: Secondary | ICD-10-CM

## 2016-10-25 ENCOUNTER — Other Ambulatory Visit: Payer: Self-pay | Admitting: Allergy and Immunology

## 2016-10-25 DIAGNOSIS — L501 Idiopathic urticaria: Secondary | ICD-10-CM | POA: Diagnosis not present

## 2016-10-25 LAB — CBC WITH DIFFERENTIAL/PLATELET
Basophils Absolute: 0 cells/uL (ref 0–200)
Basophils Relative: 0 %
Eosinophils Absolute: 154 cells/uL (ref 15–500)
Eosinophils Relative: 2 %
HCT: 40.1 % (ref 35.0–45.0)
Hemoglobin: 12.9 g/dL (ref 11.7–15.5)
Lymphocytes Relative: 30 %
Lymphs Abs: 2310 cells/uL (ref 850–3900)
MCH: 31.9 pg (ref 27.0–33.0)
MCHC: 32.2 g/dL (ref 32.0–36.0)
MCV: 99.3 fL (ref 80.0–100.0)
MPV: 8.9 fL (ref 7.5–12.5)
Monocytes Absolute: 693 cells/uL (ref 200–950)
Monocytes Relative: 9 %
Neutro Abs: 4543 cells/uL (ref 1500–7800)
Neutrophils Relative %: 59 %
Platelets: 331 10*3/uL (ref 140–400)
RBC: 4.04 MIL/uL (ref 3.80–5.10)
RDW: 12.6 % (ref 11.0–15.0)
WBC: 7.7 10*3/uL (ref 3.8–10.8)

## 2016-10-25 LAB — BUN/CREATININE RATIO
BUN/Creatinine Ratio: 17.1 Ratio (ref 6–22)
BUN: 13 mg/dL (ref 7–25)
Creat: 0.76 mg/dL (ref 0.50–1.10)
GFR, Est African American: >89 mL/min (ref >=60)
GFR, Est Non African American: >89 mL/min (ref >=60)

## 2016-10-25 LAB — HEPATIC FUNCTION PANEL
ALT: 9 U/L (ref 6–29)
AST: 12 U/L (ref 10–30)
Albumin: 4.1 g/dL (ref 3.6–5.1)
Alkaline Phosphatase: 62 U/L (ref 33–115)
Bilirubin, Direct: 0.1 mg/dL (ref <=0.2)
Indirect Bilirubin: 0.3 mg/dL (ref 0.2–1.2)
Total Bilirubin: 0.4 mg/dL (ref 0.2–1.2)
Total Protein: 6.7 g/dL (ref 6.1–8.1)

## 2016-10-26 LAB — URINALYSIS, ROUTINE W REFLEX MICROSCOPIC
Bilirubin Urine: NEGATIVE
Glucose, UA: NEGATIVE
Hgb urine dipstick: NEGATIVE
Ketones, ur: NEGATIVE
Leukocytes, UA: NEGATIVE
Nitrite: NEGATIVE
Protein, ur: NEGATIVE
Specific Gravity, Urine: 1.025 (ref 1.001–1.035)
pH: 5.5 (ref 5.0–8.0)

## 2016-11-05 MED FILL — sulfaSALAzine 500 MG TABS: 500 | 30 days supply | Qty: 120 | Fill #3

## 2016-11-05 MED FILL — SYNTHROID 75 MCG TABLET: 75 | 30 days supply | Qty: 30 | Fill #9

## 2016-11-13 NOTE — Progress Notes (Signed)
46 y.o. G96P1001 Married  Caucasian Fe here for annual exam. Periods regular, with just 2 heavy days with clots, but then light, duration.  Now has hives under control with medication now!! Sees PCP prn. Continues to see Rhuematology and medication management, all stable. Under follow up of mammogram screening with questionable benign ? fibroadenoma finding, has appointment scheduled Some hair changes ? Thyroid changing, denies new products or coloring, just some thinning?Julie Yang No other health issues today.  No LMP recorded.          Sexually active: Yes.    The current method of family planning is vasectomy.    Exercising: No.  exercise Smoker:  no  Health Maintenance: Pap:  10/16 neg, 10-26-15 neg HPV HR neg History of Abnormal Pap: yes MMG:  2/18 & 3/18 bilateral & rt breast u/s category b birads 3:prob benign f/u u/s 61mths Self Breast exams: yes Colonoscopy:  2/16 polyps due f/u 59yrs,  BMD:   none TDaP:  2017 Shingles: no Pneumonia: no Hep C and HIV:Hep c neg 2017 HIV neg 2007 or 2009? Labs: none   reports that she has never smoked. She has never used smokeless tobacco. She reports that she drinks about 1.2 - 1.8 oz of alcohol per week . She reports that she does not use drugs.  Past Medical History:  Diagnosis Date  . Abnormal Pap smear of cervix    1990s  . Anxiety   . Autoimmune disorder ([place])   . Genital warts   . Thyroid disease   . Urticaria     Past Surgical History:  Procedure Laterality Date  . CESAREAN SECTION    . CYST EXCISION     from chest, benign    Current Outpatient Prescriptions  Medication Sig Dispense Refill  . acetaminophen (TYLENOL) 325 MG tablet Take 650 mg by mouth every 6 (six) hours as needed for mild pain or moderate pain. Reported on 10/26/2015    . Cholecalciferol (VITAMIN D) 2000 units CAPS Take 1 capsule by mouth daily.    Julie Yang doxepin (SINEQUAN) 10 MG capsule Take 1 capsule (10 mg total) by mouth at bedtime. 30 capsule 3  . EPINEPHrine 0.3  mg/0.3 mL IJ SOAJ injection USE AS DIRECTED FOR SEVERE ALLERGIC REACTION 2 Device 1  . fexofenadine (ALLEGRA) 180 MG tablet Take 180 mg by mouth 2 (two) times daily.    Julie Yang levocetirizine (XYZAL) 5 MG tablet Levocetirizine (Xyzal) 5 mg twice a day  for 2 weeks if symptoms decrease then take Levocetirizine (Xyzal) 5 mg once a day. 60 tablet 5  . omalizumab (XOLAIR) 150 MG injection Inject 300 mg into the skin every 28 (twenty-eight) days. 2 vial 11  . Probiotic Product (PROBIOTIC ADVANCED PO) Take 1 capsule by mouth daily.    . ranitidine (ZANTAC) 150 MG tablet Take 1 tablet (150 mg total) by mouth 2 (two) times daily. 60 tablet 5  . sulfaSALAzine (AZULFIDINE) 500 MG tablet Take 2 tablets (1,000 mg total) by mouth 2 (two) times daily. 120 tablet 3  . SYNTHROID 75 MCG tablet Take 1 tablet (75 mcg total) by mouth daily before breakfast. 30 tablet 12   Current Facility-Administered Medications  Medication Dose Route Frequency Provider Last Rate Last Dose  . omalizumab Arvid Right) injection 300 mg  300 mg Subcutaneous Q28 days Bobbitt, [place] Muta, MD   300 mg at 10/22/16 0932    Family History  Problem Relation Age of Onset  . Heart disease Father   . Hypertension Father   .  Hyperlipidemia Father   . Eczema Father   . Heart disease Maternal Grandmother   . Diabetes Maternal Grandmother   . Breast cancer Maternal Grandmother   . Hypertension Maternal Grandmother   . Stroke Maternal Grandmother   . Heart disease Maternal Grandfather   . Lung cancer Maternal Grandfather   . Colon cancer Maternal Grandfather   . Brain cancer Maternal Grandfather   . Hypertension Maternal Grandfather   . Heart attack Maternal Grandfather   . Heart disease Paternal Grandmother   . Hypertension Paternal Grandmother   . Heart disease Paternal Grandfather   . Hypertension Paternal Grandfather   . Heart attack Paternal Grandfather   . Crohn's disease Brother   . Allergic rhinitis Neg Hx   . Angioedema Neg Hx   .  Asthma Neg Hx   . Immunodeficiency Neg Hx   . Urticaria Neg Hx     ROS:  Pertinent items are noted in HPI.  Otherwise, a comprehensive ROS was negative.  Exam:   There were no vitals taken for this visit.   Ht Readings from Last 3 Encounters:  08/05/16 5' 2.75" (1.594 m)  10/26/15 5' 3.25" (1.607 m)  09/19/15 5' 2.6" (1.59 m)    General appearance: alert, cooperative and appears stated age Head: Normocephalic, without obvious abnormality, atraumatic Neck: no adenopathy, supple, symmetrical, trachea midline and thyroid normal to inspection and palpation Lungs: clear to auscultation bilaterally Breasts: normal appearance, no masses or tenderness, No nipple retraction or dimpling, No nipple discharge or bleeding, No axillary or supraclavicular adenopathy Heart: regular rate and rhythm Abdomen: soft, non-tender; no masses,  no organomegaly Extremities: extremities normal, atraumatic, no cyanosis or edema Skin: Skin color, texture, turgor normal. No rashes or lesions Lymph nodes: Cervical, supraclavicular, and axillary nodes normal. No abnormal inguinal nodes palpated Neurologic: Grossly normal   Pelvic: External genitalia:  no lesions              Urethra:  normal appearing urethra with no masses, tenderness or lesions              Bartholin's and Skene's: normal                 Vagina: normal appearing vagina with normal color and discharge, no lesions              Cervix: no bleeding following Pap, no cervical motion tenderness and no lesions              Pap taken: Yes.   Bimanual Exam:  Uterus:  normal size, contour, position, consistency, mobility, non-tender and anteverted              Adnexa: normal adnexa and no mass, fullness, tenderness               Rectovaginal: Confirms               Anus:  normal sphincter tone, no lesions  Chaperone present: yes  A:  Well Woman with normal exam  Contraception spouse vasectomy  Hypothyroidism, labs due today  History of Vitamin  D deficiency  History of chronic hives now under control with allergist    P:   Reviewed health and wellness pertinent to exam  Synthroid stable will recheck TSH today, patient has ample amount until lab in  Labs:Vitamin D, TSH  Pap smear: yes   counseled on breast self exam, mammography screening, adequate intake of calcium and vitamin D, diet and exercise  return annually or  prn  An After Visit Summary was printed and given to the patient.

## 2016-11-14 ENCOUNTER — Other Ambulatory Visit (HOSPITAL_COMMUNITY)
Admission: RE | Admit: 2016-11-14 | Discharge: 2016-11-14 | Disposition: A | Discharge status: Home / Self Care | Payer: 59 | Source: Ambulatory Visit | Attending: Obstetrics & Gynecology | Admitting: Obstetrics & Gynecology

## 2016-11-14 ENCOUNTER — Ambulatory Visit (INDEPENDENT_AMBULATORY_CARE_PROVIDER_SITE_OTHER): Payer: 59 | Admitting: Certified Nurse Midwife

## 2016-11-14 ENCOUNTER — Encounter: Payer: Self-pay | Admitting: Certified Nurse Midwife

## 2016-11-14 VITALS — BP 110/70 | HR 68 | Resp 16 | Ht 62.75 in | Wt 205.0 lb

## 2016-11-14 DIAGNOSIS — Z01419 Encounter for gynecological examination (general) (routine) without abnormal findings: Secondary | ICD-10-CM | POA: Diagnosis not present

## 2016-11-14 DIAGNOSIS — Z0000 Encounter for general adult medical examination without abnormal findings: Secondary | ICD-10-CM | POA: Diagnosis not present

## 2016-11-14 DIAGNOSIS — E039 Hypothyroidism, unspecified: Secondary | ICD-10-CM | POA: Diagnosis not present

## 2016-11-14 DIAGNOSIS — Z124 Encounter for screening for malignant neoplasm of cervix: Secondary | ICD-10-CM

## 2016-11-14 NOTE — Patient Instructions (Signed)

## 2016-11-15 DIAGNOSIS — Z760 Encounter for issue of repeat prescription: Secondary | ICD-10-CM | POA: Diagnosis not present

## 2016-11-15 LAB — TSH: TSH: 3.66 u[IU]/mL (ref 0.450–4.500)

## 2016-11-15 LAB — VITAMIN D 25 HYDROXY (VIT D DEFICIENCY, FRACTURES): Vit D, 25-Hydroxy: 24.9 ng/mL — ABNORMAL LOW (ref 30.0–100.0)

## 2016-11-15 MED FILL — XOLAIR 150 MG SOLR: 150 | 28 days supply | Qty: 2 | Fill #2

## 2016-11-17 ENCOUNTER — Other Ambulatory Visit: Payer: Self-pay | Admitting: Certified Nurse Midwife

## 2016-11-17 DIAGNOSIS — E559 Vitamin D deficiency, unspecified: Secondary | ICD-10-CM

## 2016-11-17 DIAGNOSIS — E039 Hypothyroidism, unspecified: Secondary | ICD-10-CM

## 2016-11-17 MED ORDER — SYNTHROID 75 MCG PO TABS: 75.0000 ug | ORAL_TABLET | Freq: Every day | ORAL | 6 refills | Status: DC

## 2016-11-18 LAB — CYTOLOGY - PAP: Diagnosis: NEGATIVE

## 2016-11-19 ENCOUNTER — Encounter: Payer: Self-pay | Admitting: Allergy and Immunology

## 2016-11-19 ENCOUNTER — Ambulatory Visit (INDEPENDENT_AMBULATORY_CARE_PROVIDER_SITE_OTHER): Payer: 59 | Admitting: *Deleted

## 2016-11-19 ENCOUNTER — Other Ambulatory Visit: Payer: Self-pay | Admitting: Certified Nurse Midwife

## 2016-11-19 DIAGNOSIS — L501 Idiopathic urticaria: Secondary | ICD-10-CM

## 2016-11-19 DIAGNOSIS — N6489 Other specified disorders of breast: Secondary | ICD-10-CM

## 2016-11-19 MED ORDER — OMALIZUMAB 150 MG ~~LOC~~ SOLR
300.0000 mg | SUBCUTANEOUS | Status: DC
  Administered 2016-11-19 – 2017-02-12 (×4): 300 mg via SUBCUTANEOUS

## 2016-11-22 ENCOUNTER — Other Ambulatory Visit: Payer: Self-pay | Admitting: Allergy and Immunology

## 2016-11-22 DIAGNOSIS — L501 Idiopathic urticaria: Secondary | ICD-10-CM | POA: Diagnosis not present

## 2016-11-22 LAB — CBC WITH DIFFERENTIAL/PLATELET
Basophils Absolute: 0 cells/uL (ref 0–200)
Basophils Relative: 0 %
Eosinophils Absolute: 219 cells/uL (ref 15–500)
Eosinophils Relative: 3 %
HCT: 38.6 % (ref 35.0–45.0)
Hemoglobin: 12.6 g/dL (ref 11.7–15.5)
Lymphocytes Relative: 29 %
Lymphs Abs: 2117 cells/uL (ref 850–3900)
MCH: 32 pg (ref 27.0–33.0)
MCHC: 32.6 g/dL (ref 32.0–36.0)
MCV: 98 fL (ref 80.0–100.0)
MPV: 9.1 fL (ref 7.5–12.5)
Monocytes Absolute: 803 cells/uL (ref 200–950)
Monocytes Relative: 11 %
Neutro Abs: 4161 cells/uL (ref 1500–7800)
Neutrophils Relative %: 57 %
Platelets: 351 10*3/uL (ref 140–400)
RBC: 3.94 MIL/uL (ref 3.80–5.10)
RDW: 12.3 % (ref 11.0–15.0)
WBC: 7.3 10*3/uL (ref 3.8–10.8)

## 2016-11-22 LAB — HEPATIC FUNCTION PANEL
ALT: 9 U/L (ref 6–29)
AST: 13 U/L (ref 10–30)
Albumin: 4.2 g/dL (ref 3.6–5.1)
Alkaline Phosphatase: 58 U/L (ref 33–115)
Bilirubin, Direct: 0.1 mg/dL (ref <=0.2)
Indirect Bilirubin: 0.5 mg/dL (ref 0.2–1.2)
Total Bilirubin: 0.6 mg/dL (ref 0.2–1.2)
Total Protein: 6.8 g/dL (ref 6.1–8.1)

## 2016-11-22 LAB — BUN/CREATININE RATIO
BUN/Creatinine Ratio: 16.2 Ratio (ref 6–22)
BUN: 11 mg/dL (ref 7–25)
Creat: 0.68 mg/dL (ref 0.50–1.10)
GFR, Est African American: >89 mL/min (ref >=60)
GFR, Est Non African American: >89 mL/min (ref >=60)

## 2016-11-23 LAB — URINALYSIS, ROUTINE W REFLEX MICROSCOPIC
Bilirubin Urine: NEGATIVE
Glucose, UA: NEGATIVE
Ketones, ur: NEGATIVE
Nitrite: NEGATIVE
Protein, ur: NEGATIVE
Specific Gravity, Urine: 1.025 (ref 1.001–1.035)
pH: 5 (ref 5.0–8.0)

## 2016-11-23 LAB — URINALYSIS, MICROSCOPIC ONLY
Bacteria, UA: NONE SEEN [HPF]
Casts: NONE SEEN [LPF]
Crystals: NONE SEEN [HPF]
RBC / HPF: NONE SEEN RBC/HPF (ref <=2)

## 2016-12-06 ENCOUNTER — Other Ambulatory Visit: Payer: Self-pay | Admitting: Allergy and Immunology

## 2016-12-06 ENCOUNTER — Telehealth: Payer: Self-pay | Admitting: Allergy and Immunology

## 2016-12-06 DIAGNOSIS — L501 Idiopathic urticaria: Secondary | ICD-10-CM

## 2016-12-06 MED ORDER — SULFASALAZINE 500 MG PO TABS: 1000.0000 mg | ORAL_TABLET | Freq: Two times a day (BID) | ORAL | 2 refills | Status: DC

## 2016-12-06 MED FILL — SYNTHROID 75 MCG TABLET: 75 | 30 days supply | Qty: 30 | Fill #10

## 2016-12-06 MED FILL — sulfaSALAzine 500 MG TABS: 500 | 30 days supply | Qty: 120 | Fill #0

## 2016-12-06 NOTE — Telephone Encounter (Signed)
L/M that I had just refilled yesterday to [facility] and went ahead and refilled again today

## 2016-12-06 NOTE — Telephone Encounter (Signed)
Patient would like a refill on her sulfasalazine, called in to [facility]. She will not have enough to last her the weekend.

## 2016-12-06 NOTE — Telephone Encounter (Signed)
Called patient to inform her that she could call the pharmacy and get a refill. We gave her 2 refills on the medication. Patient them informs me the pharmacy called and said it was ready for pick up.

## 2016-12-13 MED FILL — XOLAIR 150 MG SOLR: 150 | 28 days supply | Qty: 2 | Fill #3

## 2016-12-18 ENCOUNTER — Ambulatory Visit (INDEPENDENT_AMBULATORY_CARE_PROVIDER_SITE_OTHER): Payer: 59 | Admitting: *Deleted

## 2016-12-18 DIAGNOSIS — L501 Idiopathic urticaria: Secondary | ICD-10-CM | POA: Diagnosis not present

## 2016-12-20 ENCOUNTER — Other Ambulatory Visit: Payer: Self-pay | Admitting: Allergy and Immunology

## 2016-12-20 DIAGNOSIS — L501 Idiopathic urticaria: Secondary | ICD-10-CM | POA: Diagnosis not present

## 2016-12-20 LAB — CBC WITH DIFFERENTIAL/PLATELET
Basophils Absolute: 0 cells/uL (ref 0–200)
Basophils Relative: 0 %
Eosinophils Absolute: 252 cells/uL (ref 15–500)
Eosinophils Relative: 3 %
HCT: 39.3 % (ref 35.0–45.0)
Hemoglobin: 12.9 g/dL (ref 11.7–15.5)
Lymphocytes Relative: 31 %
Lymphs Abs: 2604 cells/uL (ref 850–3900)
MCH: 32.6 pg (ref 27.0–33.0)
MCHC: 32.8 g/dL (ref 32.0–36.0)
MCV: 99.2 fL (ref 80.0–100.0)
MPV: 8.8 fL (ref 7.5–12.5)
Monocytes Absolute: 840 cells/uL (ref 200–950)
Monocytes Relative: 10 %
Neutro Abs: 4704 cells/uL (ref 1500–7800)
Neutrophils Relative %: 56 %
Platelets: 363 10*3/uL (ref 140–400)
RBC: 3.96 MIL/uL (ref 3.80–5.10)
RDW: 12.3 % (ref 11.0–15.0)
WBC: 8.4 10*3/uL (ref 3.8–10.8)

## 2016-12-20 LAB — HEPATIC FUNCTION PANEL
ALT: 9 U/L (ref 6–29)
AST: 12 U/L (ref 10–30)
Albumin: 4.2 g/dL (ref 3.6–5.1)
Alkaline Phosphatase: 57 U/L (ref 33–115)
Bilirubin, Direct: 0.1 mg/dL (ref <=0.2)
Indirect Bilirubin: 0.2 mg/dL (ref 0.2–1.2)
Total Bilirubin: 0.3 mg/dL (ref 0.2–1.2)
Total Protein: 6.7 g/dL (ref 6.1–8.1)

## 2016-12-20 LAB — URINALYSIS, ROUTINE W REFLEX MICROSCOPIC
Bilirubin Urine: NEGATIVE
Glucose, UA: NEGATIVE
Ketones, ur: NEGATIVE
Leukocytes, UA: NEGATIVE
Nitrite: NEGATIVE
Protein, ur: NEGATIVE
Specific Gravity, Urine: 1.024 (ref 1.001–1.035)
pH: 5 (ref 5.0–8.0)

## 2016-12-20 LAB — BUN/CREATININE RATIO
BUN/Creatinine Ratio: 21.9 Ratio (ref 6–22)
BUN: 14 mg/dL (ref 7–25)
Creat: 0.64 mg/dL (ref 0.50–1.10)
GFR, Est African American: >89 mL/min (ref >=60)
GFR, Est Non African American: >89 mL/min (ref >=60)

## 2016-12-20 LAB — URINALYSIS, MICROSCOPIC ONLY
Bacteria, UA: NONE SEEN [HPF]
RBC / HPF: NONE SEEN RBC/HPF (ref <=2)
Yeast: NONE SEEN [HPF]

## 2016-12-23 ENCOUNTER — Ambulatory Visit (INDEPENDENT_AMBULATORY_CARE_PROVIDER_SITE_OTHER): Payer: 59 | Admitting: Allergy and Immunology

## 2016-12-23 ENCOUNTER — Encounter: Payer: Self-pay | Admitting: Allergy and Immunology

## 2016-12-23 DIAGNOSIS — L500 Allergic urticaria: Secondary | ICD-10-CM

## 2016-12-23 DIAGNOSIS — J3089 Other allergic rhinitis: Secondary | ICD-10-CM

## 2016-12-23 NOTE — Progress Notes (Signed)
    Follow-up Note  RE: Bridgit Eynon MRN: [id number] DOB: 09-Jan-1971 Date of Office Visit: 12/23/2016  Primary care provider: Patient, No Pcp Per Referring provider: No ref. provider found  History of present illness: Julie Yang is a 46 y.o. female with chronic urticaria present today for follow up.  She was last seen in this clinic on 08/05/2016.  She is currently taking Xolair injections, sulfasalazine 1000 mg twice a day, levocetirizine 5 mg every morning, and fexofenadine 180 mg every evening.  While on this regimen her urticaria has been well-controlled.  Her symptoms return if she misses a sulfasalazine dose or if she holds antihistamines for more than a few days.   Assessment and plan: Chronic urticaria Sufficiently controlled currently.  Continue sulfasalazine 1000 mg twice a day, omalizumab injections as prescribed, and levocetirizine/fexofenadine, titrating to the lowest effective dose necessary to suppress urticaria.  Other allergic rhinitis  Allergen avoidance measures have been provided in written form.  Levocetirizine and/or fexofenadine as needed.    Diagnostics: None.    Physical examination: Blood pressure 122/74, pulse 74, temperature 98 F (36.7 C), temperature source Oral, resp. rate 16, SpO2 97 %.  General: Alert, interactive, in no acute distress. Neck: Supple without lymphadenopathy. Lungs: Clear to auscultation without wheezing, rhonchi or rales. CV: Normal S1, S2 without murmurs. Skin: Warm and dry, without lesions or rashes.  The following portions of the patient's history were reviewed and updated as appropriate: allergies, current medications, past family history, past medical history, past social history, past surgical history and problem list.  Allergies as of 12/23/2016      Reactions   Prednisone Hives   Aspirin Hives   Exacerbates hives   Ibuprofen Hives   Exacerbates hives   Iodine Hives   Povidone Iodine Rash        Medication List       Accurate as of 12/23/16 11:48 AM. Always use your most recent med list.          doxepin 10 MG capsule Commonly known as:  SINEQUAN Take 1 capsule (10 mg total) by mouth at bedtime.   EPINEPHrine 0.3 mg/0.3 mL Soaj injection Commonly known as:  EPI-PEN USE AS DIRECTED FOR SEVERE ALLERGIC REACTION   fexofenadine 180 MG tablet Commonly known as:  ALLEGRA Take 180 mg by mouth daily.   levocetirizine 5 MG tablet Commonly known as:  XYZAL Levocetirizine (Xyzal) 5 mg twice a day  for 2 weeks if symptoms decrease then take Levocetirizine (Xyzal) 5 mg once a day.   PROBIOTIC ADVANCED PO Take 1 capsule by mouth daily.   sulfaSALAzine 500 MG tablet Commonly known as:  AZULFIDINE Take 2 tablets (1,000 mg total) by mouth 2 (two) times daily.   SYNTHROID 75 MCG tablet Generic drug:  levothyroxine Take 1 tablet (75 mcg total) by mouth daily before breakfast.   Vitamin D 2000 units Caps Take 1 capsule by mouth daily.   XOLAIR [place] Inject 300 mg into the skin.       Allergies  Allergen Reactions  . Prednisone Hives  . Aspirin Hives    Exacerbates hives  . Ibuprofen Hives    Exacerbates hives  . Iodine Hives  . Povidone Iodine Rash    I appreciate the opportunity to take part in [place] care. Please do not hesitate to contact me with questions.  Sincerely,   R. Edgar Frisk, MD

## 2016-12-23 NOTE — Patient Instructions (Signed)
Chronic urticaria Sufficiently controlled currently.  Continue sulfasalazine 1000 mg twice a day, omalizumab injections as prescribed, and levocetirizine/fexofenadine, titrating to the lowest effective dose necessary to suppress urticaria.  Other allergic rhinitis  Allergen avoidance measures have been provided in written form.  Levocetirizine and/or fexofenadine as needed.   Return in about 6 months (around 06/25/2017), or if symptoms worsen or fail to improve.

## 2016-12-23 NOTE — Assessment & Plan Note (Signed)
Sufficiently controlled currently.  Continue sulfasalazine 1000 mg twice a day, omalizumab injections as prescribed, and levocetirizine/fexofenadine, titrating to the lowest effective dose necessary to suppress urticaria.

## 2016-12-23 NOTE — Assessment & Plan Note (Signed)
   Allergen avoidance measures have been provided in written form.  Levocetirizine and/or fexofenadine as needed.

## 2017-01-02 MED FILL — sulfaSALAzine 500 MG TABS: 500 | 30 days supply | Qty: 120 | Fill #1

## 2017-01-07 ENCOUNTER — Other Ambulatory Visit: Payer: Self-pay | Admitting: Certified Nurse Midwife

## 2017-01-07 ENCOUNTER — Ambulatory Visit
Admission: RE | Admit: 2017-01-07 | Discharge: 2017-01-07 | Disposition: A | Discharge status: Home / Self Care | Payer: 59 | Source: Ambulatory Visit | Attending: Certified Nurse Midwife | Admitting: Certified Nurse Midwife

## 2017-01-07 DIAGNOSIS — N6310 Unspecified lump in the right breast, unspecified quadrant: Secondary | ICD-10-CM

## 2017-01-07 DIAGNOSIS — N6489 Other specified disorders of breast: Secondary | ICD-10-CM

## 2017-01-07 DIAGNOSIS — N6311 Unspecified lump in the right breast, upper outer quadrant: Secondary | ICD-10-CM | POA: Diagnosis not present

## 2017-01-07 MED FILL — SYNTHROID 75 MCG TABLET: 75 | 30 days supply | Qty: 30 | Fill #0

## 2017-01-08 MED FILL — XOLAIR 150 MG SOLR: 150 | 28 days supply | Qty: 2 | Fill #4

## 2017-01-15 ENCOUNTER — Ambulatory Visit (INDEPENDENT_AMBULATORY_CARE_PROVIDER_SITE_OTHER): Payer: 59

## 2017-01-15 DIAGNOSIS — L500 Allergic urticaria: Secondary | ICD-10-CM

## 2017-01-15 DIAGNOSIS — M778 Other enthesopathies, not elsewhere classified: Secondary | ICD-10-CM | POA: Diagnosis not present

## 2017-01-24 ENCOUNTER — Encounter: Payer: Self-pay | Admitting: Allergy and Immunology

## 2017-01-24 DIAGNOSIS — L508 Other urticaria: Secondary | ICD-10-CM

## 2017-01-29 ENCOUNTER — Encounter: Payer: Self-pay | Admitting: Certified Nurse Midwife

## 2017-01-29 ENCOUNTER — Encounter: Payer: Self-pay | Admitting: Allergy and Immunology

## 2017-02-05 MED FILL — sulfaSALAzine 500 MG TABS: 500 | 30 days supply | Qty: 120 | Fill #2

## 2017-02-07 MED FILL — XOLAIR 150 MG SOLR: 150 | 28 days supply | Qty: 2 | Fill #5

## 2017-02-12 ENCOUNTER — Ambulatory Visit (INDEPENDENT_AMBULATORY_CARE_PROVIDER_SITE_OTHER): Payer: 59 | Admitting: *Deleted

## 2017-02-12 DIAGNOSIS — L501 Idiopathic urticaria: Secondary | ICD-10-CM | POA: Diagnosis not present

## 2017-02-24 MED FILL — SYNTHROID 75 MCG TABLET: 75 | 30 days supply | Qty: 30 | Fill #1

## 2017-02-25 DIAGNOSIS — L508 Other urticaria: Secondary | ICD-10-CM | POA: Diagnosis not present

## 2017-02-26 ENCOUNTER — Other Ambulatory Visit (INDEPENDENT_AMBULATORY_CARE_PROVIDER_SITE_OTHER): Payer: 59

## 2017-02-26 ENCOUNTER — Other Ambulatory Visit: Payer: Self-pay | Admitting: *Deleted

## 2017-02-26 DIAGNOSIS — L501 Idiopathic urticaria: Secondary | ICD-10-CM

## 2017-02-26 DIAGNOSIS — E559 Vitamin D deficiency, unspecified: Secondary | ICD-10-CM | POA: Diagnosis not present

## 2017-02-26 LAB — URINALYSIS, ROUTINE W REFLEX MICROSCOPIC
Bilirubin Urine: NEGATIVE
Glucose, UA: NEGATIVE
Hgb urine dipstick: NEGATIVE
Ketones, ur: NEGATIVE
Leukocytes, UA: NEGATIVE
Nitrite: NEGATIVE
Protein, ur: NEGATIVE
Specific Gravity, Urine: 1.012 (ref 1.001–1.03)
pH: <=5 (ref 5.0–8.0)

## 2017-02-26 LAB — CBC WITH DIFFERENTIAL/PLATELET
Basophils Absolute: 32 cells/uL (ref 0–200)
Basophils Relative: 0.4 %
Eosinophils Absolute: 87 cells/uL (ref 15–500)
Eosinophils Relative: 1.1 %
HCT: 39.4 % (ref 35.0–45.0)
Hemoglobin: 13.1 g/dL (ref 11.7–15.5)
Lymphs Abs: 2473 cells/uL (ref 850–3900)
MCH: 31.8 pg (ref 27.0–33.0)
MCHC: 33.2 g/dL (ref 32.0–36.0)
MCV: 95.6 fL (ref 80.0–100.0)
MPV: 9.3 fL (ref 7.5–12.5)
Monocytes Relative: 9.7 %
Neutro Abs: 4543 cells/uL (ref 1500–7800)
Neutrophils Relative %: 57.5 %
Platelets: 378 10*3/uL (ref 140–400)
RBC: 4.12 10*6/uL (ref 3.80–5.10)
RDW: 11 % (ref 11.0–15.0)
Total Lymphocyte: 31.3 %
WBC mixed population: 766 cells/uL (ref 200–950)
WBC: 7.9 10*3/uL (ref 3.8–10.8)

## 2017-02-26 LAB — HEPATIC FUNCTION PANEL
AG Ratio: 1.7 (calc) (ref 1.0–2.5)
ALT: 12 U/L (ref 6–29)
AST: 16 U/L (ref 10–30)
Albumin: 4.5 g/dL (ref 3.6–5.1)
Alkaline phosphatase (APISO): 63 U/L (ref 33–115)
Bilirubin, Direct: 0.1 mg/dL (ref 0.0–0.2)
Globulin: 2.7 g/dL (ref 1.9–3.7)
Indirect Bilirubin: 0.4 mg/dL (ref 0.2–1.2)
Total Bilirubin: 0.5 mg/dL (ref 0.2–1.2)
Total Protein: 7.2 g/dL (ref 6.1–8.1)

## 2017-02-26 LAB — BUN/CREATININE RATIO
BUN: 10 mg/dL (ref 7–25)
Creat: 0.66 mg/dL (ref 0.50–1.10)
GFR, Est African American: 125 mL/min/1.73m2
GFR, Est Non African American: 108 mL/min/1.73m2

## 2017-02-26 MED ORDER — SULFASALAZINE 500 MG PO TABS: 1000.0000 mg | ORAL_TABLET | Freq: Two times a day (BID) | ORAL | 0 refills | Status: DC

## 2017-02-26 NOTE — Telephone Encounter (Signed)
Spoke to patient about results would like refill on sulfasalazine. rx sent in

## 2017-02-27 LAB — VITAMIN D 25 HYDROXY (VIT D DEFICIENCY, FRACTURES): VIT D 25 HYDROXY: 35.5 ng/mL (ref 30.0–100.0)

## 2017-03-05 ENCOUNTER — Encounter: Payer: Self-pay | Admitting: Allergy and Immunology

## 2017-03-10 MED FILL — sulfaSALAzine 500 MG TABS: 500 | 30 days supply | Qty: 120 | Fill #0

## 2017-03-12 ENCOUNTER — Ambulatory Visit: Payer: 59

## 2017-04-03 MED FILL — sulfaSALAzine 500 MG TABS: 500 | 30 days supply | Qty: 120 | Fill #1

## 2017-04-08 MED FILL — SYNTHROID 75 MCG TABLET: 75 | 30 days supply | Qty: 30 | Fill #2

## 2017-05-08 MED FILL — SYNTHROID 75 MCG TABLET: 75 | 30 days supply | Qty: 30 | Fill #3

## 2017-05-08 MED FILL — sulfaSALAzine 500 MG TABS: 500 | 30 days supply | Qty: 120 | Fill #2

## 2017-05-19 ENCOUNTER — Encounter: Payer: Self-pay | Admitting: Allergy and Immunology

## 2017-05-19 DIAGNOSIS — L508 Other urticaria: Secondary | ICD-10-CM

## 2017-05-29 DIAGNOSIS — L508 Other urticaria: Secondary | ICD-10-CM | POA: Diagnosis not present

## 2017-05-30 LAB — CBC WITH DIFFERENTIAL/PLATELET
Basophils Absolute: 8 cells/uL (ref 0–200)
Basophils Relative: 0.1 %
Eosinophils Absolute: 101 cells/uL (ref 15–500)
Eosinophils Relative: 1.3 %
HCT: 38.7 % (ref 35.0–45.0)
Hemoglobin: 13.3 g/dL (ref 11.7–15.5)
Lymphs Abs: 2605 cells/uL (ref 850–3900)
MCH: 32.3 pg (ref 27.0–33.0)
MCHC: 34.4 g/dL (ref 32.0–36.0)
MCV: 93.9 fL (ref 80.0–100.0)
MPV: 9.7 fL (ref 7.5–12.5)
Monocytes Relative: 8.4 %
Neutro Abs: 4430 cells/uL (ref 1500–7800)
Neutrophils Relative %: 56.8 %
Platelets: 387 10*3/uL (ref 140–400)
RBC: 4.12 10*6/uL (ref 3.80–5.10)
RDW: 11.3 % (ref 11.0–15.0)
Total Lymphocyte: 33.4 %
WBC mixed population: 655 cells/uL (ref 200–950)
WBC: 7.8 10*3/uL (ref 3.8–10.8)

## 2017-05-30 LAB — HEPATIC FUNCTION PANEL
AG Ratio: 1.8 (calc) (ref 1.0–2.5)
ALT: 9 U/L (ref 6–29)
AST: 13 U/L (ref 10–30)
Albumin: 4.6 g/dL (ref 3.6–5.1)
Alkaline phosphatase (APISO): 55 U/L (ref 33–115)
Bilirubin, Direct: 0.1 mg/dL (ref 0.0–0.2)
Globulin: 2.6 g/dL (calc) (ref 1.9–3.7)
Indirect Bilirubin: 0.3 mg/dL (calc) (ref 0.2–1.2)
Total Bilirubin: 0.4 mg/dL (ref 0.2–1.2)
Total Protein: 7.2 g/dL (ref 6.1–8.1)

## 2017-05-30 LAB — URINALYSIS
Bilirubin Urine: NEGATIVE
Glucose, UA: NEGATIVE
Ketones, ur: NEGATIVE
Nitrite: NEGATIVE
Protein, ur: NEGATIVE
Specific Gravity, Urine: 1.008 (ref 1.001–1.03)
pH: <=5 (ref 5.0–8.0)

## 2017-05-30 LAB — BUN/CREATININE RATIO
BUN: 13 mg/dL (ref 7–25)
Creat: 0.71 mg/dL (ref 0.50–1.10)
GFR, Est African American: 120 mL/min/{1.73_m2} (ref >=60)
GFR, Est Non African American: 104 mL/min/{1.73_m2} (ref >=60)

## 2017-06-06 DIAGNOSIS — E039 Hypothyroidism, unspecified: Secondary | ICD-10-CM | POA: Diagnosis not present

## 2017-06-06 DIAGNOSIS — L508 Other urticaria: Secondary | ICD-10-CM | POA: Diagnosis not present

## 2017-06-06 DIAGNOSIS — Z1211 Encounter for screening for malignant neoplasm of colon: Secondary | ICD-10-CM | POA: Diagnosis not present

## 2017-06-10 MED FILL — SYNTHROID 75 MCG TABLET: 75 | 30 days supply | Qty: 30 | Fill #4

## 2017-06-10 MED FILL — sulfaSALAzine 500 MG TABS: 500 | 30 days supply | Qty: 120 | Fill #0

## 2017-06-25 ENCOUNTER — Ambulatory Visit (INDEPENDENT_AMBULATORY_CARE_PROVIDER_SITE_OTHER): Payer: Self-pay | Admitting: Emergency Medicine

## 2017-06-25 VITALS — BP 100/80 | HR 93 | Temp 98.6°F | Resp 16 | Wt 197.8 lb

## 2017-06-25 DIAGNOSIS — H1012 Acute atopic conjunctivitis, left eye: Secondary | ICD-10-CM

## 2017-06-25 MED ORDER — OLOPATADINE HCL 0.2 % OP SOLN: 1.0000 [drp] | Freq: Every day | OPHTHALMIC | 0 refills | Status: DC

## 2017-06-25 MED FILL — OLOPATADINE HCL 0.2 % SOLN: 0.2 | 30 days supply | Qty: 3 | Fill #0

## 2017-06-25 NOTE — Patient Instructions (Signed)
Allergic Conjunctivitis A clear membrane (conjunctiva) covers the white part of your eye and the inner surface of your eyelid. Allergic conjunctivitis happens when this membrane has inflammation. This is caused by allergies. Common causes of allergic reactions (allergens)include:  Outdoor allergens, such as:  Pollen.  Grass and weeds.  Mold spores.  Indoor allergens, such as:  Dust.  Smoke.  Mold.  Pet dander.  Animal hair. This condition can make your eye red or pink. It can also make your eye feel itchy. This condition cannot be spread from one person to another person (is not contagious). Follow these instructions at home:  Try not to be around things that you are allergic to.  Take or apply over-the-counter and prescription medicines only as told by your doctor. These include any eye drops.  Place a cool, clean washcloth on your eye for 10-20 minutes. Do this 3-4 times a day.  Do not touch or rub your eyes.  Do not wear contact lenses until the inflammation is gone. Wear glasses instead.  Do not wear eye makeup until the inflammation is gone.  Keep all follow-up visits as told by your doctor. This is important. Contact a doctor if:  Your symptoms get worse.  Your symptoms do not get better with treatment.  You have mild eye pain.  You are sensitive to light,  You have spots or blisters on your eyes.  You have pus coming from your eye.  You have a fever. Get help right away if:  You have redness, swelling, or other symptoms in only one eye.  Your vision is blurry.  You have vision changes.  You have very bad eye pain. Summary  Allergic conjunctivitis is caused by allergies. It can make your eye red or pink, and it can make your eye feel itchy.  This condition cannot be spread from one person to another person (is not contagious).  Try not to be around things that you are allergic to.  Take or apply over-the-counter and prescription medicines  only as told by your doctor. These include any eye drops.  Contact your doctor if your symptoms get worse or they do not get better with treatment. This information is not intended to replace advice given to you by your health care provider. Make sure you discuss any questions you have with your health care provider. Document Released: 10/03/2009 Document Revised: 12/08/2015 Document Reviewed: 12/08/2015 Elsevier Interactive Patient Education  2017 [employer]  

## 2017-06-25 NOTE — Progress Notes (Signed)
Subjective:    Julie Yang is a 47 y.o. female who presents for evaluation of discharge, itching, tearing and conjunctival injection in the left eye. She has noticed the above symptoms for 1 day. Onset was gradual. Patient denies blurred vision, foreign body sensation, pain, photophobia and visual field deficit. There is a history of allergies and frequent conjunctivitis.  The following portions of the patient's history were reviewed and updated as appropriate: allergies and current medications.  Review of Systems Pertinent items noted in HPI and remainder of comprehensive ROS otherwise negative.   Objective:    BP 100/80 (BP Location: Right Arm, Patient Position: Sitting, Cuff Size: Normal)   Pulse 93   Temp 98.6 F (37 C) (Oral)   Resp 16   Wt 197 lb 12.8 oz (89.7 kg)   SpO2 96%   BMI 35.32 kg/m       General: alert, cooperative and appears stated age  Eyes:  Left eye tearing, no periorbital pain, minimal conjunctival injection, no crusting.  Vision: Not performed  Fluorescein:  not done     Assessment:    Allergic conjunctivitis   Plan:    Ophthalmic drops per orders. Local eye care discussed. Analgesics as needed. FU here in 1 week or PRN.   OTC antihistamines as needed

## 2017-07-07 ENCOUNTER — Other Ambulatory Visit: Payer: Self-pay | Admitting: Allergy and Immunology

## 2017-07-07 DIAGNOSIS — L501 Idiopathic urticaria: Secondary | ICD-10-CM

## 2017-07-07 MED ORDER — SULFASALAZINE 500 MG PO TABS: 1000.0000 mg | ORAL_TABLET | Freq: Two times a day (BID) | ORAL | 0 refills | Status: DC

## 2017-07-07 MED FILL — sulfaSALAzine 500 MG TABS: 500 | 30 days supply | Qty: 120 | Fill #0

## 2017-07-10 ENCOUNTER — Ambulatory Visit
Admission: RE | Admit: 2017-07-10 | Discharge: 2017-07-10 | Disposition: A | Discharge status: Home / Self Care | Payer: 59 | Source: Ambulatory Visit | Attending: Certified Nurse Midwife | Admitting: Certified Nurse Midwife

## 2017-07-10 DIAGNOSIS — D241 Benign neoplasm of right breast: Secondary | ICD-10-CM | POA: Diagnosis not present

## 2017-07-10 DIAGNOSIS — N6310 Unspecified lump in the right breast, unspecified quadrant: Secondary | ICD-10-CM

## 2017-07-10 DIAGNOSIS — R928 Other abnormal and inconclusive findings on diagnostic imaging of breast: Secondary | ICD-10-CM | POA: Diagnosis not present

## 2017-07-10 DIAGNOSIS — N6489 Other specified disorders of breast: Secondary | ICD-10-CM | POA: Diagnosis not present

## 2017-07-10 MED FILL — SYNTHROID 75 MCG TABLET: 75 | 30 days supply | Qty: 30 | Fill #5

## 2017-07-14 DIAGNOSIS — Z131 Encounter for screening for diabetes mellitus: Secondary | ICD-10-CM | POA: Diagnosis not present

## 2017-07-14 DIAGNOSIS — Z13220 Encounter for screening for lipoid disorders: Secondary | ICD-10-CM | POA: Diagnosis not present

## 2017-07-14 DIAGNOSIS — Z0000 Encounter for general adult medical examination without abnormal findings: Secondary | ICD-10-CM | POA: Diagnosis not present

## 2017-07-28 ENCOUNTER — Encounter: Payer: Self-pay | Admitting: Allergy and Immunology

## 2017-07-28 DIAGNOSIS — L508 Other urticaria: Secondary | ICD-10-CM

## 2017-07-30 ENCOUNTER — Other Ambulatory Visit: Payer: Self-pay | Admitting: Allergy and Immunology

## 2017-07-30 DIAGNOSIS — L508 Other urticaria: Secondary | ICD-10-CM | POA: Diagnosis not present

## 2017-07-31 LAB — URINALYSIS, ROUTINE W REFLEX MICROSCOPIC
Bacteria, UA: NONE SEEN /HPF
Bilirubin Urine: NEGATIVE
Glucose, UA: NEGATIVE
Hgb urine dipstick: NEGATIVE
Hyaline Cast: NONE SEEN /LPF
Ketones, ur: NEGATIVE
Nitrite: NEGATIVE
Protein, ur: NEGATIVE
RBC / HPF: NONE SEEN /HPF (ref 0–2)
Specific Gravity, Urine: 1.02 (ref 1.001–1.03)
pH: 6 (ref 5.0–8.0)

## 2017-07-31 LAB — CBC WITH DIFFERENTIAL/PLATELET
Basophils Absolute: 7 cells/uL (ref 0–200)
Basophils Relative: 0.1 %
Eosinophils Absolute: 83 cells/uL (ref 15–500)
Eosinophils Relative: 1.2 %
HCT: 39.1 % (ref 35.0–45.0)
Hemoglobin: 13.3 g/dL (ref 11.7–15.5)
Lymphs Abs: 2208 cells/uL (ref 850–3900)
MCH: 32.5 pg (ref 27.0–33.0)
MCHC: 34 g/dL (ref 32.0–36.0)
MCV: 95.6 fL (ref 80.0–100.0)
MPV: 9.9 fL (ref 7.5–12.5)
Monocytes Relative: 7.8 %
Neutro Abs: 4064 cells/uL (ref 1500–7800)
Neutrophils Relative %: 58.9 %
Platelets: 382 10*3/uL (ref 140–400)
RBC: 4.09 10*6/uL (ref 3.80–5.10)
RDW: 11.3 % (ref 11.0–15.0)
Total Lymphocyte: 32 %
WBC mixed population: 538 cells/uL (ref 200–950)
WBC: 6.9 10*3/uL (ref 3.8–10.8)

## 2017-07-31 LAB — BUN/CREATININE RATIO
BUN: 13 mg/dL (ref 7–25)
Creat: 0.71 mg/dL (ref 0.50–1.10)
GFR, Est African American: 120 mL/min/{1.73_m2} (ref >=60)
GFR, Est Non African American: 104 mL/min/{1.73_m2} (ref >=60)

## 2017-07-31 LAB — HEPATIC FUNCTION PANEL
AG Ratio: 1.7 (calc) (ref 1.0–2.5)
ALT: 11 U/L (ref 6–29)
AST: 14 U/L (ref 10–30)
Albumin: 4.5 g/dL (ref 3.6–5.1)
Alkaline phosphatase (APISO): 61 U/L (ref 33–115)
Bilirubin, Direct: 0.1 mg/dL (ref 0.0–0.2)
Globulin: 2.7 g/dL (calc) (ref 1.9–3.7)
Indirect Bilirubin: 0.3 mg/dL (calc) (ref 0.2–1.2)
Total Bilirubin: 0.4 mg/dL (ref 0.2–1.2)
Total Protein: 7.2 g/dL (ref 6.1–8.1)

## 2017-08-05 ENCOUNTER — Ambulatory Visit: Payer: 59 | Admitting: Allergy and Immunology

## 2017-08-05 ENCOUNTER — Encounter: Payer: Self-pay | Admitting: Allergy and Immunology

## 2017-08-05 DIAGNOSIS — L500 Allergic urticaria: Secondary | ICD-10-CM | POA: Diagnosis not present

## 2017-08-05 NOTE — Assessment & Plan Note (Signed)
Currently well controlled.  Slowly and gradually titrate down the dose of sulfasalazine to find the lowest effective dose.  For now, continue antihistamines to allow for decrease in sulfasalazine.

## 2017-08-05 NOTE — Progress Notes (Signed)
    Follow-up Note  RE: Julie Yang MRN: [id number] DOB: 08-Oct-1970 Date of Office Visit: 08/05/2017  Primary care provider: London Pepper, MD Referring provider: No ref. provider found  History of present illness: Julie Yang is a 47 y.o. female with chronic urticaria presenting today for follow-up.  She was last seen in this clinic in August 2018.  She reports that her urticaria has been well controlled.  In fact, she discontinued Xolair approximately 6 months ago due to perceived side effects there was no increase in the hives.  She also notes that she fell asleep on a couple occasions prior to taken her evening dose of sulfasalazine and did not have an increase in urticaria.  Previously, if she missed a single dose of sulfasalazine her hives would recur.  She still takes antihistamines twice daily.  Assessment and plan: Chronic urticaria Currently well controlled.  Slowly and gradually titrate down the dose of sulfasalazine to find the lowest effective dose.  For now, continue antihistamines to allow for decrease in sulfasalazine.     Physical examination: Blood pressure 126/86, pulse 76, temperature 98.1 F (36.7 C), temperature source Oral, resp. rate 18, SpO2 96 %.  General: Alert, interactive, in no acute distress. Neck: Supple without lymphadenopathy. Lungs: Clear to auscultation without wheezing, rhonchi or rales. CV: Normal S1, S2 without murmurs. Skin: Warm and dry, without lesions or rashes.  The following portions of the patient's history were reviewed and updated as appropriate: allergies, current medications, past family history, past medical history, past social history, past surgical history and problem list.  Allergies as of 08/05/2017      Reactions   Prednisone Hives   Aspirin Hives   Exacerbates hives   Ibuprofen Hives   Exacerbates hives   Iodine Hives   Povidone Iodine Rash      Medication List        Accurate as of 08/05/17  6:08 PM. Always  use your most recent med list.          doxepin 10 MG capsule Commonly known as:  SINEQUAN Take 1 capsule (10 mg total) by mouth at bedtime.   EPINEPHrine 0.3 mg/0.3 mL Soaj injection Commonly known as:  EPI-PEN USE AS DIRECTED FOR SEVERE ALLERGIC REACTION   fexofenadine 180 MG tablet Commonly known as:  ALLEGRA Take 180 mg by mouth daily.   levocetirizine 5 MG tablet Commonly known as:  XYZAL Levocetirizine (Xyzal) 5 mg twice a day  for 2 weeks if symptoms decrease then take Levocetirizine (Xyzal) 5 mg once a day.   PROBIOTIC ADVANCED PO Take 1 capsule by mouth daily.   sulfaSALAzine 500 MG tablet Commonly known as:  AZULFIDINE Take 2 tablets (1,000 mg total) by mouth 2 (two) times daily.   SYNTHROID 75 MCG tablet Generic drug:  levothyroxine Take 1 tablet (75 mcg total) by mouth daily before breakfast.   Vitamin D 2000 units Caps Take 1 capsule by mouth daily.       Allergies  Allergen Reactions  . Prednisone Hives  . Aspirin Hives    Exacerbates hives  . Ibuprofen Hives    Exacerbates hives  . Iodine Hives  . Povidone Iodine Rash    I appreciate the opportunity to take part in Julie Yang care. Please do not hesitate to contact me with questions.  Sincerely,   R. Edgar Frisk, MD

## 2017-08-05 NOTE — Patient Instructions (Signed)
Chronic urticaria Currently well controlled.  Slowly and gradually titrate down the dose of sulfasalazine to find the lowest effective dose.  For now, continue antihistamines to allow for decrease in sulfasalazine.   Return in about 6 months (around 02/04/2018), or if symptoms worsen or fail to improve.

## 2017-08-12 ENCOUNTER — Other Ambulatory Visit: Payer: Self-pay | Admitting: Allergy and Immunology

## 2017-08-12 DIAGNOSIS — L501 Idiopathic urticaria: Secondary | ICD-10-CM

## 2017-08-12 MED FILL — SYNTHROID 75 MCG TABLET: 75 | 30 days supply | Qty: 30 | Fill #6

## 2017-08-12 MED FILL — sulfaSALAzine 500 MG TABS: 500 | 30 days supply | Qty: 120 | Fill #0

## 2017-09-05 ENCOUNTER — Ambulatory Visit (INDEPENDENT_AMBULATORY_CARE_PROVIDER_SITE_OTHER): Payer: Self-pay | Admitting: Nurse Practitioner

## 2017-09-05 ENCOUNTER — Encounter: Payer: Self-pay | Admitting: Nurse Practitioner

## 2017-09-05 VITALS — BP 136/78 | HR 101 | Temp 99.3°F | Resp 22 | Wt 193.4 lb

## 2017-09-05 DIAGNOSIS — J0110 Acute frontal sinusitis, unspecified: Secondary | ICD-10-CM

## 2017-09-05 DIAGNOSIS — J209 Acute bronchitis, unspecified: Secondary | ICD-10-CM

## 2017-09-05 DIAGNOSIS — H6692 Otitis media, unspecified, left ear: Secondary | ICD-10-CM

## 2017-09-05 MED ORDER — PSEUDOEPH-BROMPHEN-DM 30-2-10 MG/5ML PO SYRP: 5.0000 mL | ORAL_SOLUTION | Freq: Four times a day (QID) | ORAL | 0 refills | Status: AC | PRN

## 2017-09-05 MED ORDER — IPRATROPIUM BROMIDE 0.06 % NA SOLN: 2.0000 | Freq: Four times a day (QID) | NASAL | 12 refills | Status: DC

## 2017-09-05 MED ORDER — AMOXICILLIN-POT CLAVULANATE 875-125 MG PO TABS: 1.0000 | ORAL_TABLET | Freq: Two times a day (BID) | ORAL | 0 refills | Status: AC

## 2017-09-05 MED ORDER — ALBUTEROL SULFATE 108 (90 BASE) MCG/ACT IN AEPB: 2.0000 | INHALATION_SPRAY | Freq: Four times a day (QID) | RESPIRATORY_TRACT | 0 refills | Status: DC | PRN

## 2017-09-05 MED FILL — IPRATROPIUM 0.06% SPRAY: 0.06 | 10 days supply | Qty: 15 | Fill #0

## 2017-09-05 MED FILL — PROAIR RESPICLICK INHAL PWD: 108 (90 BAS | 1 days supply | Qty: 1 | Fill #0

## 2017-09-05 MED FILL — BROMIPHENIR-PSEUDOEPHED-DM: 30-2-10 | 7 days supply | Qty: 150 | Fill #0

## 2017-09-05 MED FILL — AMOX TR-K CLV 875-125 MG TA: 875-125 | 10 days supply | Qty: 20 | Fill #0

## 2017-09-05 NOTE — Patient Instructions (Signed)
Acute Bronchitis, Adult Acute bronchitis is sudden (acute) swelling of the air tubes (bronchi) in the lungs. Acute bronchitis causes these tubes to fill with mucus, which can make it hard to breathe. It can also cause coughing or wheezing. In adults, acute bronchitis usually goes away within 2 weeks. A cough caused by bronchitis may last up to 3 weeks. Smoking, allergies, and asthma can make the condition worse. Repeated episodes of bronchitis may cause further lung problems, such as chronic obstructive pulmonary disease (COPD). What are the causes? This condition can be caused by germs and by substances that irritate the lungs, including:  Cold and flu viruses. This condition is most often caused by the same virus that causes a cold.  Bacteria.  Exposure to tobacco smoke, dust, fumes, and air pollution.  What increases the risk? This condition is more likely to develop in people who:  Have close contact with someone with acute bronchitis.  Are exposed to lung irritants, such as tobacco smoke, dust, fumes, and vapors.  Have a weak immune system.  Have a respiratory condition such as asthma.  What are the signs or symptoms? Symptoms of this condition include:  A cough.  Coughing up clear, yellow, or green mucus.  Wheezing.  Chest congestion.  Shortness of breath.  A fever.  Body aches.  Chills.  A sore throat.  How is this diagnosed? This condition is usually diagnosed with a physical exam. During the exam, your health care provider may order tests, such as chest X-rays, to rule out other conditions. He or she may also:  Test a sample of your mucus for bacterial infection.  Check the level of oxygen in your blood. This is done to check for pneumonia.  Do a chest X-ray or lung function testing to rule out pneumonia and other conditions.  Perform blood tests.  Your health care provider will also ask about your symptoms and medical history. How is this  treated? Most cases of acute bronchitis clear up over time without treatment. Your health care provider may recommend:  Drinking more fluids. Drinking more makes your mucus thinner, which may make it easier to breathe.  Taking a medicine for a fever or cough.  Taking an antibiotic medicine.  Using an inhaler to help improve shortness of breath and to control a cough.  Using a cool mist vaporizer or humidifier to make it easier to breathe.  Follow these instructions at home: Medicines  Take over-the-counter and prescription medicines only as told by your health care provider.  If you were prescribed an antibiotic, take it as told by your health care provider. Do not stop taking the antibiotic even if you start to feel better. General instructions  Get plenty of rest.  Drink enough fluids to keep your urine clear or pale yellow.  Avoid smoking and secondhand smoke. Exposure to cigarette smoke or irritating chemicals will make bronchitis worse. If you smoke and you need help quitting, ask your health care provider. Quitting smoking will help your lungs heal faster.  Use an inhaler, cool mist vaporizer, or humidifier as told by your health care provider.  Keep all follow-up visits as told by your health care provider. This is important. How is this prevented? To lower your risk of getting this condition again:  Wash your hands often with soap and water. If soap and water are not available, use hand sanitizer.  Avoid contact with people who have cold symptoms.  Try not to touch your hands to your   mouth, nose, or eyes.  Make sure to get the flu shot every year.  Contact a health care provider if:  Your symptoms do not improve in 2 weeks of treatment. Get help right away if:  You cough up blood.  You have chest pain.  You have severe shortness of breath.  You become dehydrated.  You faint or keep feeling like you are going to faint.  You keep vomiting.  You have a  severe headache.  Your fever or chills gets worse. This information is not intended to replace advice given to you by your health care provider. Make sure you discuss any questions you have with your health care provider. Document Released: 05/23/2004 Document Revised: 11/08/2015 Document Reviewed: 10/04/2015 Elsevier Interactive Patient Education  2018 [employer].  Otitis Media, Adult Otitis media occurs when there is inflammation and fluid in the middle ear. Your middle ear is a part of the ear that contains bones for hearing as well as air that helps send sounds to your brain. What are the causes? This condition is caused by a blockage in the eustachian tube. This tube drains fluid from the ear to the back of the nose (nasopharynx). A blockage in this tube can be caused by an object or by swelling (edema) in the tube. Problems that can cause a blockage include:  A cold or other upper respiratory infection.  Allergies.  An irritant, such as tobacco smoke.  Enlarged adenoids. The adenoids are areas of soft tissue located high in the back of the throat, behind the nose and the roof of the mouth.  A mass in the nasopharynx.  Damage to the ear caused by pressure changes (barotrauma).  What are the signs or symptoms? Symptoms of this condition include:  Ear pain.  A fever.  Decreased hearing.  A headache.  Tiredness (lethargy).  Fluid leaking from the ear.  Ringing in the ear.  How is this diagnosed? This condition is diagnosed with a physical exam. During the exam your health care provider will use an instrument called an otoscope to look into your ear and check for redness, swelling, and fluid. He or she will also ask about your symptoms. Your health care provider may also order tests, such as:  A test to check the movement of the eardrum (pneumatic otoscopy). This test is done by squeezing a small amount of air into the ear.  A test that changes air pressure in the  middle ear to check how well the eardrum moves and whether the eustachian tube is working (tympanogram).  How is this treated? This condition usually goes away on its own within 3-5 days. But if the condition is caused by a bacteria infection and does not go away own its own, or keeps coming back, your health care provider may:  Prescribe antibiotic medicines to treat the infection.  Prescribe or recommend medicines to control pain.  Follow these instructions at home:  Take over-the-counter and prescription medicines only as told by your health care provider.  If you were prescribed an antibiotic medicine, take it as told by your health care provider. Do not stop taking the antibiotic even if you start to feel better.  Keep all follow-up visits as told by your health care provider. This is important. Contact a health care provider if:  You have bleeding from your nose.  There is a lump on your neck.  You are not getting better in 5 days.  You feel worse instead of better.  Get help right away if:  You have severe pain that is not controlled with medicine.  You have swelling, redness, or pain around your ear.  You have stiffness in your neck.  A part of your face is paralyzed.  The bone behind your ear (mastoid) is tender when you touch it.  You develop a severe headache. Summary  Otitis media is redness, soreness, and swelling of the middle ear.  This condition usually goes away on its own within 3-5 days.  If the problem does not go away in 3-5 days, your health care provider may prescribe or recommend medicines to treat your symptoms.  If you were prescribed an antibiotic medicine, take it as told by your health care provider. This information is not intended to replace advice given to you by your health care provider. Make sure you discuss any questions you have with your health care provider. Document Released: 01/19/2004 Document Revised: 04/05/2016 Document  Reviewed: 04/05/2016 Elsevier Interactive Patient Education  2018 [employer].  Sinusitis, Adult Sinusitis is soreness and inflammation of your sinuses. Sinuses are hollow spaces in the bones around your face. Your sinuses are located:  Around your eyes.  In the middle of your forehead.  Behind your nose.  In your cheekbones.  Your sinuses and nasal passages are lined with a stringy fluid (mucus). Mucus normally drains out of your sinuses. When your nasal tissues become inflamed or swollen, the mucus can become trapped or blocked so air cannot flow through your sinuses. This allows bacteria, viruses, and funguses to grow, which leads to infection. Sinusitis can develop quickly and last for 7?10 days (acute) or for more than 12 weeks (chronic). Sinusitis often develops after a cold. What are the causes? This condition is caused by anything that creates swelling in the sinuses or stops mucus from draining, including:  Allergies.  Asthma.  Bacterial or viral infection.  Abnormally shaped bones between the nasal passages.  Nasal growths that contain mucus (nasal polyps).  Narrow sinus openings.  Pollutants, such as chemicals or irritants in the air.  A foreign object stuck in the nose.  A fungal infection. This is rare.  What increases the risk? The following factors may make you more likely to develop this condition:  Having allergies or asthma.  Having had a recent cold or respiratory tract infection.  Having structural deformities or blockages in your nose or sinuses.  Having a weak immune system.  Doing a lot of swimming or diving.  Overusing nasal sprays.  Smoking.  What are the signs or symptoms? The main symptoms of this condition are pain and a feeling of pressure around the affected sinuses. Other symptoms include:  Upper toothache.  Earache.  Headache.  Bad breath.  Decreased sense of smell and taste.  A cough that may get worse at  night.  Fatigue.  Fever.  Thick drainage from your nose. The drainage is often green and it may contain pus (purulent).  Stuffy nose or congestion.  Postnasal drip. This is when extra mucus collects in the throat or back of the nose.  Swelling and warmth over the affected sinuses.  Sore throat.  Sensitivity to light.  How is this diagnosed? This condition is diagnosed based on symptoms, a medical history, and a physical exam. To find out if your condition is acute or chronic, your health care provider may:  Look in your nose for signs of nasal polyps.  Tap over the affected sinus to check for signs of  infection.  View the inside of your sinuses using an imaging device that has a light attached (endoscope).  If your health care provider suspects that you have chronic sinusitis, you may also:  Be tested for allergies.  Have a sample of mucus taken from your nose (nasal culture) and checked for bacteria.  Have a mucus sample examined to see if your sinusitis is related to an allergy.  If your sinusitis does not respond to treatment and it lasts longer than 8 weeks, you may have an MRI or CT scan to check your sinuses. These scans also help to determine how severe your infection is. In rare cases, a bone biopsy may be done to rule out more serious types of fungal sinus disease. How is this treated? Treatment for sinusitis depends on the cause and whether your condition is chronic or acute. If a virus is causing your sinusitis, your symptoms will go away on their own within 10 days. You may be given medicines to relieve your symptoms, including:  Topical nasal decongestants. They shrink swollen nasal passages and let mucus drain from your sinuses.  Antihistamines. These drugs block inflammation that is triggered by allergies. This can help to ease swelling in your nose and sinuses.  Topical nasal corticosteroids. These are nasal sprays that ease inflammation and swelling in  your nose and sinuses.  Nasal saline washes. These rinses can help to get rid of thick mucus in your nose.  If your condition is caused by bacteria, you will be given an antibiotic medicine. If your condition is caused by a fungus, you will be given an antifungal medicine. Surgery may be needed to correct underlying conditions, such as narrow nasal passages. Surgery may also be needed to remove polyps. Follow these instructions at home: Medicines  Take, use, or apply over-the-counter and prescription medicines only as told by your health care provider. These may include nasal sprays.  If you were prescribed an antibiotic medicine, take it as told by your health care provider. Do not stop taking the antibiotic even if you start to feel better. Hydrate and Humidify  Drink enough water to keep your urine clear or pale yellow. Staying hydrated will help to thin your mucus.  Use a cool mist humidifier to keep the humidity level in your home above 50%.  Inhale steam for 10-15 minutes, 3-4 times a day or as told by your health care provider. You can do this in the bathroom while a hot shower is running.  Limit your exposure to cool or dry air. Rest  Rest as much as possible.  Sleep with your head raised (elevated).  Make sure to get enough sleep each night. General instructions  Apply a warm, moist washcloth to your face 3-4 times a day or as told by your health care provider. This will help with discomfort.  Wash your hands often with soap and water to reduce your exposure to viruses and other germs. If soap and water are not available, use hand sanitizer.  Do not smoke. Avoid being around people who are smoking (secondhand smoke).  Keep all follow-up visits as told by your health care provider. This is important. Contact a health care provider if:  You have a fever.  Your symptoms get worse.  Your symptoms do not improve within 10 days. Get help right away if:  You have a  severe headache.  You have persistent vomiting.  You have pain or swelling around your face or eyes.  You have  vision problems.  You develop confusion.  Your neck is stiff.  You have trouble breathing. This information is not intended to replace advice given to you by your health care provider. Make sure you discuss any questions you have with your health care provider. Document Released: 04/15/2005 Document Revised: 12/10/2015 Document Reviewed: 02/08/2015 Elsevier Interactive Patient Education  [employer].

## 2017-09-05 NOTE — Progress Notes (Signed)
Subjective:  Julie Yang is a 47 y.o. female who presents for evaluation of URI like symptoms.  Symptoms include congestion, chills, fever: suspected fevers but not measured at home, headache described as dull, aching, nasal congestion, productive cough with yellowish-green colored sputum, sinus pressure, sinus pain, sore throat and fatigue.  Onset of symptoms was 3 weeks ago, and has been gradually worsening since that time.  Patient states her cough has worsened over the past week.  Patient complains of intermittent chest tightness, but denies wheezing or difficulty breathing.  Treatment to date:  acetaminophen and mucinex.  High risk factors for influenza complications:  none.  The following portions of the patient's history were reviewed and updated as appropriate:  allergies, current medications and past medical history.  Constitutional: positive for chills, fatigue and fevers, negative for anorexia, malaise and sweats Eyes: negative Ears, nose, mouth, throat, and face: positive for nasal congestion, sore throat and ear fullness/pressure, negative for ear drainage and hoarseness Respiratory: positive for cough and sputum, negative for asthma, chronic bronchitis, dyspnea on exertion, stridor and wheezing Cardiovascular: negative Gastrointestinal: negative Neurological: positive for headaches, negative for coordination problems, dizziness, paresthesia, seizures, tremors, vertigo and weakness Allergic/Immunologic: positive for hay fever Objective:  BP 136/78 (BP Location: Left Arm, Patient Position: Sitting, Cuff Size: Normal)   Pulse (!) 101   Temp 99.3 F (37.4 C) (Oral)   Resp (!) 22   Wt 193 lb 6.4 oz (87.7 kg)   SpO2 98%   BMI 34.53 kg/m  General appearance: alert, cooperative, fatigued and ill-appearing Head: Normocephalic, without obvious abnormality, atraumatic Eyes: conjunctivae/corneas clear. PERRL, EOM's intact. Fundi benign. Ears: abnormal TM left ear - erythematous, dull  and bulging Nose: yellow discharge, moderate congestion, turbinates swollen, inflamed, no maxillary sinus tenderness bilateral, moderate frontal sinus tenderness right Throat: lips, mucosa, and tongue normal; teeth and gums normal Lungs: clear to auscultation bilaterally Heart: regular rate and rhythm, S1, S2 normal, no murmur, click, rub or gallop Abdomen: soft, non-tender; bowel sounds normal; no masses,  no organomegaly Pulses: 2+ and symmetric Skin: Skin color, texture, turgor normal. No rashes or lesions Lymph nodes: cervical and submandibular nodes normal Neurologic: Grossly normal    Assessment:  Acute Frontal Sinusitis, Acute Left Otitis Media, Acute Bronchitis    Plan:  Acute Frontal Sinusitis 1.  Augmentin 875/125 mg 1 tab twice daily for 10 days. 2.  Ipratropium bromide inhaler may use up to 4 times daily for nasal congestion. 3. Patient education provided.  Acute Left Otitis Media 1.  Augmentin 875/125 mg 1 tab twice daily for 10 days. 2.  Warm compresses to left ear. 3.  Tylenol for pain fever or general discomfort. 4. Patient education provided.  Acute Bronchitis 1.  Albuterol inhaler 2 puffs every 6 hours as needed for cough. 2.  Bromfed 5 mL up to 4 times daily as needed for cough. 3.  Increase fluids. 4.  Humidifier or vaporizer while at home. 5.  Sleep elevated on 2 pillows. 6. Patient education provided. 7.  Follow-up in the ER if difficulty breathing, shortness of breath, or other concerns. Patient verbalizes understanding and has no questions at time of discharge. Meds ordered this encounter  Medications  . amoxicillin-clavulanate (AUGMENTIN) 875-125 MG tablet    Sig: Take 1 tablet by mouth 2 (two) times daily for 10 days.    Dispense:  20 tablet    Refill:  0    Order Specific Question:   Supervising Provider    Answer:  JENKINS, JOHN E [[id number]]  . ipratropium (ATROVENT) 0.06 % nasal spray    Sig: Place 2 sprays into both nostrils 4 (four) times  daily.    Dispense:  15 mL    Refill:  12    Order Specific Question:   Supervising Provider    Answer:   Ricard Dillon [[id number]]  . Albuterol Sulfate (PROAIR RESPICLICK) 660 (90 Base) MCG/ACT AEPB    Sig: Inhale 2 puffs into the lungs every 6 (six) hours as needed for up to 10 days.    Dispense:  1 each    Refill:  0    Order Specific Question:   Supervising Provider    Answer:   Ricard Dillon [[id number]]  . brompheniramine-pseudoephedrine-DM 30-2-10 MG/5ML syrup    Sig: Take 5 mLs by mouth 4 (four) times daily as needed for up to 7 days.    Dispense:  150 mL    Refill:  0    Order Specific Question:   Supervising Provider    Answer:   Ricard Dillon [phone]

## 2017-09-08 ENCOUNTER — Telehealth: Payer: Self-pay

## 2017-09-08 NOTE — Telephone Encounter (Signed)
Patient states she is doing well.

## 2017-10-08 MED FILL — sulfaSALAzine 500 MG TABS: 500 | 30 days supply | Qty: 120 | Fill #1

## 2017-10-09 ENCOUNTER — Other Ambulatory Visit: Payer: Self-pay | Admitting: Certified Nurse Midwife

## 2017-10-09 ENCOUNTER — Encounter: Payer: Self-pay | Admitting: Certified Nurse Midwife

## 2017-10-09 DIAGNOSIS — E039 Hypothyroidism, unspecified: Secondary | ICD-10-CM

## 2017-10-09 MED ORDER — SYNTHROID 75 MCG PO TABS: 75.0000 ug | ORAL_TABLET | Freq: Every day | ORAL | 0 refills | Status: DC

## 2017-10-09 MED FILL — SYNTHROID 75 MCG TABLET: 75 | 30 days supply | Qty: 30 | Fill #0

## 2017-10-09 NOTE — Telephone Encounter (Signed)
-----   Message from [place], Generic sent at 10/09/2017 8:01 AM EDT -----    I am almost out of Synthroid with no refills left. I see Dr Hollice Espy at the end of July. Is it possible to get refills called in until then?    Julie Yang

## 2017-10-09 NOTE — Telephone Encounter (Signed)
Medication refill request: synthroid  Last AEX:  11/14/16 DL  Next AEX: 11/21/17  Last MMG (if hormonal medication request): 07/10/17 BIRADS 3 probably benign  Refill authorized: 11/17/16 #30, 6RF. Today, please advise.   Call to patient. Patient confirmed [facility] as [facility]. Advised patient request would be sent to Debbi to review.

## 2017-10-10 DIAGNOSIS — H5213 Myopia, bilateral: Secondary | ICD-10-CM | POA: Diagnosis not present

## 2017-10-24 ENCOUNTER — Other Ambulatory Visit: Payer: Self-pay | Admitting: Allergy and Immunology

## 2017-10-24 DIAGNOSIS — L508 Other urticaria: Secondary | ICD-10-CM | POA: Diagnosis not present

## 2017-10-25 LAB — HEPATIC FUNCTION PANEL
AG Ratio: 1.6 (calc) (ref 1.0–2.5)
ALT: 14 U/L (ref 6–29)
AST: 15 U/L (ref 10–30)
Albumin: 4.2 g/dL (ref 3.6–5.1)
Alkaline phosphatase (APISO): 60 U/L (ref 33–115)
Bilirubin, Direct: 0.1 mg/dL (ref 0.0–0.2)
Globulin: 2.7 g/dL (calc) (ref 1.9–3.7)
Indirect Bilirubin: 0.2 mg/dL (calc) (ref 0.2–1.2)
Total Bilirubin: 0.3 mg/dL (ref 0.2–1.2)
Total Protein: 6.9 g/dL (ref 6.1–8.1)

## 2017-10-25 LAB — CBC WITH DIFFERENTIAL/PLATELET
Basophils Absolute: 10 cells/uL (ref 0–200)
Basophils Relative: 0.1 %
Eosinophils Absolute: 93 cells/uL (ref 15–500)
Eosinophils Relative: 0.9 %
HCT: 36.8 % (ref 35.0–45.0)
Hemoglobin: 12.4 g/dL (ref 11.7–15.5)
Lymphs Abs: 2678 cells/uL (ref 850–3900)
MCH: 31.4 pg (ref 27.0–33.0)
MCHC: 33.7 g/dL (ref 32.0–36.0)
MCV: 93.2 fL (ref 80.0–100.0)
MPV: 9.6 fL (ref 7.5–12.5)
Monocytes Relative: 10.3 %
Neutro Abs: 6458 cells/uL (ref 1500–7800)
Neutrophils Relative %: 62.7 %
Platelets: 361 10*3/uL (ref 140–400)
RBC: 3.95 10*6/uL (ref 3.80–5.10)
RDW: 11.3 % (ref 11.0–15.0)
Total Lymphocyte: 26 %
WBC mixed population: 1061 cells/uL — ABNORMAL HIGH (ref 200–950)
WBC: 10.3 10*3/uL (ref 3.8–10.8)

## 2017-10-25 LAB — BUN/CREATININE RATIO
BUN: 15 mg/dL (ref 7–25)
Creat: 0.76 mg/dL (ref 0.50–1.10)
GFR, Est African American: 111 mL/min/{1.73_m2} (ref >=60)
GFR, Est Non African American: 95 mL/min/{1.73_m2} (ref >=60)

## 2017-10-25 LAB — URINALYSIS, ROUTINE W REFLEX MICROSCOPIC
Bacteria, UA: NONE SEEN /HPF
Bilirubin Urine: NEGATIVE
Glucose, UA: NEGATIVE
Hgb urine dipstick: NEGATIVE
Hyaline Cast: NONE SEEN /LPF
Ketones, ur: NEGATIVE
Nitrite: NEGATIVE
Protein, ur: NEGATIVE
RBC / HPF: NONE SEEN /HPF (ref 0–2)
Specific Gravity, Urine: 1.012 (ref 1.001–1.03)
pH: 6.5 (ref 5.0–8.0)

## 2017-11-03 ENCOUNTER — Telehealth: Payer: Self-pay | Admitting: Certified Nurse Midwife

## 2017-11-03 ENCOUNTER — Encounter: Payer: Self-pay | Admitting: Obstetrics & Gynecology

## 2017-11-03 ENCOUNTER — Ambulatory Visit (INDEPENDENT_AMBULATORY_CARE_PROVIDER_SITE_OTHER): Payer: 59 | Admitting: Obstetrics & Gynecology

## 2017-11-03 ENCOUNTER — Other Ambulatory Visit: Payer: Self-pay

## 2017-11-03 VITALS — BP 110/60 | HR 92 | Resp 16 | Ht 62.75 in | Wt 197.0 lb

## 2017-11-03 DIAGNOSIS — N898 Other specified noninflammatory disorders of vagina: Secondary | ICD-10-CM | POA: Diagnosis not present

## 2017-11-03 DIAGNOSIS — R3129 Other microscopic hematuria: Secondary | ICD-10-CM | POA: Diagnosis not present

## 2017-11-03 DIAGNOSIS — R300 Dysuria: Secondary | ICD-10-CM

## 2017-11-03 LAB — POCT URINALYSIS DIPSTICK
Bilirubin, UA: NEGATIVE
Blood, UA: NEGATIVE
Glucose, UA: NEGATIVE
Ketones, UA: NEGATIVE
NITRITE UA: NEGATIVE
PROTEIN UA: NEGATIVE
Urobilinogen, UA: 0.2 E.U./dL
pH, UA: 5 (ref 5.0–8.0)

## 2017-11-03 MED ORDER — SULFAMETHOXAZOLE-TRIMETHOPRIM 800-160 MG PO TABS: 1.0000 | ORAL_TABLET | Freq: Two times a day (BID) | ORAL | 0 refills | Status: DC

## 2017-11-03 MED FILL — SULFAMETHOXAZOLE-TMP DS TAB: 800-160 | 3 days supply | Qty: 6 | Fill #0

## 2017-11-03 NOTE — Telephone Encounter (Signed)
Patient called and left a message on our answering machine requesting an appointment this afternoon for a possible UTI.

## 2017-11-03 NOTE — Telephone Encounter (Signed)
Spoke with patient. Patient reports burning with urination, frequency, urgency, itching and itching. Treated with OTC monistat, felt like yeast, symptoms did not resolve. Denies vaginal d/c, odor, fever, chills, N/V, lower back pain, blood in urine. Patient requesting OV.   Patient aware Melvia Heaps, CNM is out of the office today, request covering provider. OV scheduled with Dr. Sabra Heck for 7/8 at 3:30pm. Patient verbalizes understanding and is agreeable. Encounter closed.

## 2017-11-03 NOTE — Progress Notes (Signed)
GYNECOLOGY  VISIT  CC:   Vaginal itching  HPI: 47 y.o. G21P1001 Married Caucasian female here for vaginal itching, dysuria, discomfort with sex on and off x 6 weeks.  Reports right before her cycle in May, she had these symptoms.  Her cycle made symptoms improve.  Symptoms then returned so she treated with an OTC Monistat.  Then had cycle again and symptoms returned.  She is not having any vaginal itching.  Cycles are regular and normal.    GYNECOLOGIC HISTORY: Patient's last menstrual period was 10/12/2017. Contraception: vasectomy  Menopausal hormone therapy: none  Patient Active Problem List   Diagnosis Date Noted  . Other allergic rhinitis 12/23/2016  . Hypothyroidism 10/26/2015    Class: History of  . Chronic urticaria 09/19/2015  . Allergic reaction 09/19/2015  . Angioedema 09/19/2015    Past Medical History:  Diagnosis Date  . Abnormal Pap smear of cervix    1990s  . Anxiety   . Autoimmune disorder ([place])   . Genital warts   . Thyroid disease   . Urticaria     Past Surgical History:  Procedure Laterality Date  . CESAREAN SECTION    . CYST EXCISION     from chest, benign    MEDS:   Current Outpatient Medications on File Prior to Visit  Medication Sig Dispense Refill  . Cholecalciferol (VITAMIN D) 2000 units CAPS Take 1 capsule by mouth daily.    . fexofenadine (ALLEGRA) 180 MG tablet Take 180 mg by mouth daily.     Marland Kitchen levocetirizine (XYZAL) 5 MG tablet Levocetirizine (Xyzal) 5 mg twice a day  for 2 weeks if symptoms decrease then take Levocetirizine (Xyzal) 5 mg once a day. (Patient taking differently: daily. Levocetirizine (Xyzal) 5 mg twice a day  for 2 weeks if symptoms decrease then take Levocetirizine (Xyzal) 5 mg once a day.) 60 tablet 5  . Probiotic Product (PROBIOTIC ADVANCED PO) Take 1 capsule by mouth daily.    Marland Kitchen sulfaSALAzine (AZULFIDINE) 500 MG tablet Take 2 tablets (1,000 mg total) by mouth 2 (two) times daily. 120 tablet 5  . SYNTHROID 75 MCG tablet  Take 1 tablet (75 mcg total) by mouth daily before breakfast. 30 tablet 0   No current facility-administered medications on file prior to visit.     ALLERGIES: Prednisone; Aspirin; Ibuprofen; Iodine; and Povidone iodine  Family History  Problem Relation Age of Onset  . Heart disease Father   . Hypertension Father   . Hyperlipidemia Father   . Eczema Father   . Heart disease Maternal Grandmother   . Diabetes Maternal Grandmother   . Breast cancer Maternal Grandmother 80  . Hypertension Maternal Grandmother   . Stroke Maternal Grandmother   . Heart disease Maternal Grandfather   . Lung cancer Maternal Grandfather   . Colon cancer Maternal Grandfather   . Brain cancer Maternal Grandfather   . Hypertension Maternal Grandfather   . Heart attack Maternal Grandfather   . Heart disease Paternal Grandmother   . Hypertension Paternal Grandmother   . Heart disease Paternal Grandfather   . Hypertension Paternal Grandfather   . Heart attack Paternal Grandfather   . Crohn's disease Brother   . Allergic rhinitis Neg Hx   . Angioedema Neg Hx   . Asthma Neg Hx   . Immunodeficiency Neg Hx   . Urticaria Neg Hx     SH:  Married, non smoker  Review of Systems  Genitourinary: Positive for dysuria and urgency.  Night urination Loss of urine with sneeze/cough  Vulvar itching Loss of sexual interest  Pain with intercourse   All other systems reviewed and are negative.   PHYSICAL EXAMINATION:    BP 110/60 (BP Location: Right Arm, Patient Position: Sitting, Cuff Size: Large)   Pulse 92   Resp 16   Ht 5' 2.75" (1.594 m)   Wt 197 lb (89.4 kg)   LMP 10/12/2017   BMI 35.18 kg/m     General appearance: alert, cooperative and appears stated age Lymph:  No inguinal LAD noted  Pelvic: External genitalia:  no lesions              Urethra:  normal appearing urethra with no masses, tenderness or lesions              Bartholins and Skenes: normal                 Vagina: normal  appearing vagina with normal color and discharge, no lesions              Cervix: no lesions              Bimanual Exam:  Uterus:  normal size, contour, position, consistency, mobility, non-tender              Adnexa: no mass, fullness, tenderness  Chaperone was present for exam.  Assessment: Dysuria Vaginal discharge  Plan: Affirm testing pending Urine micro and culture pending. Starting bactrim DS BID x 3 days. Results will be called and recommendations made at that time.

## 2017-11-04 LAB — URINALYSIS, MICROSCOPIC ONLY
Casts: NONE SEEN /lpf
Epithelial Cells (non renal): >10 /hpf — AB (ref 0–10)

## 2017-11-04 LAB — VAGINITIS/VAGINOSIS, DNA PROBE
CANDIDA SPECIES: NEGATIVE
Gardnerella vaginalis: NEGATIVE
TRICHOMONAS VAG: NEGATIVE

## 2017-11-04 LAB — URINE CULTURE

## 2017-11-07 ENCOUNTER — Ambulatory Visit (INDEPENDENT_AMBULATORY_CARE_PROVIDER_SITE_OTHER): Payer: 59 | Admitting: Obstetrics & Gynecology

## 2017-11-07 ENCOUNTER — Telehealth: Payer: Self-pay | Admitting: Certified Nurse Midwife

## 2017-11-07 VITALS — BP 116/74 | HR 76 | Temp 98.2°F

## 2017-11-07 DIAGNOSIS — R3129 Other microscopic hematuria: Secondary | ICD-10-CM | POA: Diagnosis not present

## 2017-11-07 DIAGNOSIS — N898 Other specified noninflammatory disorders of vagina: Secondary | ICD-10-CM

## 2017-11-07 LAB — POCT URINALYSIS DIPSTICK
BILIRUBIN UA: NEGATIVE
GLUCOSE UA: NEGATIVE
Ketones, UA: NEGATIVE
Nitrite, UA: NEGATIVE
Protein, UA: NEGATIVE
Urobilinogen, UA: 0.2 E.U./dL
pH, UA: 5 (ref 5.0–8.0)

## 2017-11-07 MED ORDER — METRONIDAZOLE 0.75 % VA GEL: 1.0000 | Freq: Every day | VAGINAL | 0 refills | Status: DC

## 2017-11-07 MED ORDER — FLUCONAZOLE 150 MG PO TABS: 150.0000 mg | ORAL_TABLET | Freq: Once | ORAL | 0 refills | Status: AC

## 2017-11-07 MED FILL — FLUCONAZOLE 150 MG TABS: 150 | 3 days supply | Qty: 2 | Fill #0

## 2017-11-07 MED FILL — metroNIDAZOLE 0.75 % GEL: 0.75 | 5 days supply | Qty: 70 | Fill #0

## 2017-11-07 NOTE — Telephone Encounter (Signed)
Call to patient. Advised of results from Dr Sabra Heck. Patient has finished antibiotic this am. Since not feeling better and menses in a few days, will come today for repeat micro.   Routing to Dr Sabra Heck, Juluis Rainier.

## 2017-11-07 NOTE — Progress Notes (Signed)
GYNECOLOGY  VISIT  CC:   Continued dysuria and skin irritation   HPI: 46 y.o. G66P1001 Married Caucasian female here for recurrent dysuria symptoms.  Pt feels she is having "stinging" with voiding but it feels more like a skin issue than a bladder issue.  She did have a urine culture, affirm testing and urine micro.  The micro showed RBCs and WBCs as well as calcium oxalate stones and epithelial cells.  Will repeat this today.  Also reports she is having stinging of the vulvar skin when she wipes.  She does have mild urinary leakage every day if having coughing or laughing.  Wears a small pad every day and has not recently changed the brand.  Has been using poise pads for sometime.  Cannot think of any new product she's used as well.      GYNECOLOGIC HISTORY: Patient's last menstrual period was 10/12/2017. Contraception: vasectomy  Menopausal hormone therapy: none  Patient Active Problem List   Diagnosis Date Noted  . Other allergic rhinitis 12/23/2016  . Hypothyroidism 10/26/2015    Class: History of  . Chronic urticaria 09/19/2015  . Allergic reaction 09/19/2015  . Angioedema 09/19/2015    Past Medical History:  Diagnosis Date  . Abnormal Pap smear of cervix    1990s  . Anxiety   . Autoimmune disorder ([place])   . Genital warts   . Thyroid disease   . Urticaria     Past Surgical History:  Procedure Laterality Date  . CESAREAN SECTION    . CYST EXCISION     from chest, benign    MEDS:   Current Outpatient Medications on File Prior to Visit  Medication Sig Dispense Refill  . Cholecalciferol (VITAMIN D) 2000 units CAPS Take 1 capsule by mouth daily.    . fexofenadine (ALLEGRA) 180 MG tablet Take 180 mg by mouth daily.     Marland Kitchen levocetirizine (XYZAL) 5 MG tablet Levocetirizine (Xyzal) 5 mg twice a day  for 2 weeks if symptoms decrease then take Levocetirizine (Xyzal) 5 mg once a day. (Patient taking differently: daily. Levocetirizine (Xyzal) 5 mg twice a day  for 2 weeks if  symptoms decrease then take Levocetirizine (Xyzal) 5 mg once a day.) 60 tablet 5  . Probiotic Product (PROBIOTIC ADVANCED PO) Take 1 capsule by mouth daily.    Marland Kitchen sulfaSALAzine (AZULFIDINE) 500 MG tablet Take 2 tablets (1,000 mg total) by mouth 2 (two) times daily. 120 tablet 5  . SYNTHROID 75 MCG tablet Take 1 tablet (75 mcg total) by mouth daily before breakfast. 30 tablet 0   No current facility-administered medications on file prior to visit.     ALLERGIES: Prednisone; Aspirin; Ibuprofen; and Iodine  Family History  Problem Relation Age of Onset  . Heart disease Father   . Hypertension Father   . Hyperlipidemia Father   . Eczema Father   . Heart disease Maternal Grandmother   . Diabetes Maternal Grandmother   . Breast cancer Maternal Grandmother 80  . Hypertension Maternal Grandmother   . Stroke Maternal Grandmother   . Heart disease Maternal Grandfather   . Lung cancer Maternal Grandfather   . Colon cancer Maternal Grandfather   . Brain cancer Maternal Grandfather   . Hypertension Maternal Grandfather   . Heart attack Maternal Grandfather   . Heart disease Paternal Grandmother   . Hypertension Paternal Grandmother   . Heart disease Paternal Grandfather   . Hypertension Paternal Grandfather   . Heart attack Paternal Grandfather   .  Crohn's disease Brother   . Allergic rhinitis Neg Hx   . Angioedema Neg Hx   . Asthma Neg Hx   . Immunodeficiency Neg Hx   . Urticaria Neg Hx     SH:  Married, non smoker  Review of Systems  Genitourinary: Positive for dysuria. Negative for frequency, hematuria and urgency.  All other systems reviewed and are negative.   PHYSICAL EXAMINATION:    BP 116/74 (BP Location: Right Arm, Patient Position: Sitting, Cuff Size: Normal)   Pulse 76   Temp 98.2 F (36.8 C) (Oral)   LMP 10/12/2017     General appearance: alert, cooperative and appears stated age Lymph:  No inguinal LAD noted  Pelvic: External genitalia:  no lesions               Urethra:  normal appearing urethra with no masses, tenderness or lesions              Bartholins and Skenes: normal                 Vagina: erythema at most distal vaginal tissue, almost circumferentially around the vagina, mild watery discharge present, no lesions              Cervix: no lesions              Bimanual Exam:  Uterus:  normal size, contour, position, consistency, mobility, non-tender              Adnexa: no masses, fullness  Chaperone was present for exam.  Assessment: Skin irritation? With associated "stinging with urination" Erythematous skin changes noted  Plan: Repeat urine micro Repeat affirm Will treat with metrogel 0.75% nightly x 5 nights and difliucan 150mg  po x 1, repeat 72 hours if needed.

## 2017-11-07 NOTE — Telephone Encounter (Signed)
Patient left message stating that she would like to know her results from this week. She stated that the medication that Dr. Sabra Heck prescribed does not seem to be helping.

## 2017-11-07 NOTE — Telephone Encounter (Signed)
-----   Message from Megan Salon, MD sent at 11/06/2017  6:06 PM EDT ----- Please let pt now her vaginitis testing was negative for yeast, BV, and trich.  Also, her urine culture was negative but the micro showed WBCs, RBCs, and calcium oxalate crystals.  This is what causes kidney stones.  She should have finished the antibiotics.  I'd like her to return for a repeat urine micro.  Order placed.  If this is still showing blood, I will sent her to urology for evaluation of possible stones.  Thanks.

## 2017-11-08 LAB — URINALYSIS, MICROSCOPIC ONLY: CASTS: NONE SEEN /LPF

## 2017-11-09 LAB — VAGINITIS/VAGINOSIS, DNA PROBE
Candida Species: NEGATIVE
Gardnerella vaginalis: NEGATIVE
TRICHOMONAS VAG: NEGATIVE

## 2017-11-10 ENCOUNTER — Telehealth: Payer: Self-pay | Admitting: *Deleted

## 2017-11-10 ENCOUNTER — Encounter: Payer: Self-pay | Admitting: Obstetrics & Gynecology

## 2017-11-10 DIAGNOSIS — E559 Vitamin D deficiency, unspecified: Secondary | ICD-10-CM | POA: Insufficient documentation

## 2017-11-10 NOTE — Telephone Encounter (Signed)
LM for pt with normal results and to call back to give update. Per pt release form.

## 2017-11-10 NOTE — Telephone Encounter (Signed)
-----   Message from Megan Salon, MD sent at 11/10/2017  7:25 AM EDT ----- Please call pt and let her know vaginitis testing was again negative.  Does she feel any better.  Urine was still abnormal with WBC and RBCs.  May need urology referral but I still think some of this is related to her vaginal irritation.  Please get update and then we can decide next step.  Thanks.

## 2017-11-10 NOTE — Telephone Encounter (Signed)
Patient states she is feeling somewhat better now, she is not sure completely because she still using cream and she is on her period that started 11/08/17. Patient will call back when she is done with period and cream to see if she is feeling better then and we can go from there.   Dr. Lestine Box Encounter closed.

## 2017-11-12 ENCOUNTER — Ambulatory Visit
Admission: RE | Admit: 2017-11-12 | Discharge: 2017-11-12 | Disposition: A | Discharge status: Home / Self Care | Payer: 59 | Source: Ambulatory Visit | Attending: Otolaryngology | Admitting: Otolaryngology

## 2017-11-12 ENCOUNTER — Other Ambulatory Visit: Payer: Self-pay | Admitting: Otolaryngology

## 2017-11-12 DIAGNOSIS — J322 Chronic ethmoidal sinusitis: Secondary | ICD-10-CM | POA: Diagnosis not present

## 2017-11-12 DIAGNOSIS — R430 Anosmia: Secondary | ICD-10-CM | POA: Diagnosis not present

## 2017-11-12 DIAGNOSIS — J320 Chronic maxillary sinusitis: Secondary | ICD-10-CM | POA: Diagnosis not present

## 2017-11-12 DIAGNOSIS — J342 Deviated nasal septum: Secondary | ICD-10-CM | POA: Diagnosis not present

## 2017-11-12 DIAGNOSIS — R0981 Nasal congestion: Secondary | ICD-10-CM

## 2017-11-12 DIAGNOSIS — J329 Chronic sinusitis, unspecified: Secondary | ICD-10-CM | POA: Diagnosis not present

## 2017-11-12 DIAGNOSIS — J301 Allergic rhinitis due to pollen: Secondary | ICD-10-CM | POA: Diagnosis not present

## 2017-11-14 ENCOUNTER — Encounter: Payer: Self-pay | Admitting: Obstetrics & Gynecology

## 2017-11-21 ENCOUNTER — Other Ambulatory Visit: Payer: Self-pay

## 2017-11-21 ENCOUNTER — Ambulatory Visit (INDEPENDENT_AMBULATORY_CARE_PROVIDER_SITE_OTHER): Payer: 59 | Admitting: Certified Nurse Midwife

## 2017-11-21 ENCOUNTER — Encounter: Payer: Self-pay | Admitting: Certified Nurse Midwife

## 2017-11-21 VITALS — BP 120/80 | HR 70 | Resp 16 | Ht 62.75 in | Wt 197.0 lb

## 2017-11-21 DIAGNOSIS — Z01419 Encounter for gynecological examination (general) (routine) without abnormal findings: Secondary | ICD-10-CM

## 2017-11-21 DIAGNOSIS — E038 Other specified hypothyroidism: Secondary | ICD-10-CM

## 2017-11-21 NOTE — Patient Instructions (Signed)

## 2017-11-21 NOTE — Progress Notes (Signed)
47 y.o. G58P1001 Married  Caucasian Fe here for annual exam. Periods slightly heavier day 1-2 and then lighter for 2-3 days, minimal cramping. Weight loss in progress 8 pounds now. Bladder and vaginal stinging has resolved with Diflucan and Metrogel use together. No urinary symptoms. Sees PCP Dr. Orland Mustard yearly with labs, next year. Due for colonoscopy and will schedule. No other health issues today. Busy with daughter this summer!  Patient's last menstrual period was 11/08/2017 (exact date).          Sexually active: Yes.    The current method of family planning is vasectomy.    Exercising: No.  exercise Smoker:  no  Health Maintenance: Pap:  10-26-15 neg HPV HR neg, 11-14-16 neg History of Abnormal Pap: yes MMG:  3/19 bilateral & u/s rt breast birads 3 prob benign Self Breast exams: yes Colonoscopy:  2016 polyps due f/u 40yrs BMD:   none TDaP:  2017 Shingles: no Pneumonia: no Hep C and HIV: Hep c neg 2017, HIV neg yrs ago Labs: with PCP   reports that she has never smoked. She has never used smokeless tobacco. She reports that she drinks about 1.2 - 1.8 oz of alcohol per week. She reports that she does not use drugs.  Past Medical History:  Diagnosis Date  . Abnormal Pap smear of cervix    1990s  . Anxiety   . Autoimmune disorder ([place])   . Genital warts   . Thyroid disease   . Urticaria     Past Surgical History:  Procedure Laterality Date  . CESAREAN SECTION    . CYST EXCISION     from chest, benign    Current Outpatient Medications  Medication Sig Dispense Refill  . Cholecalciferol (VITAMIN D) 2000 units CAPS Take 1 capsule by mouth daily.    . fexofenadine (ALLEGRA) 180 MG tablet Take 180 mg by mouth daily.     Marland Kitchen levocetirizine (XYZAL) 5 MG tablet Levocetirizine (Xyzal) 5 mg twice a day  for 2 weeks if symptoms decrease then take Levocetirizine (Xyzal) 5 mg once a day. (Patient taking differently: daily. Levocetirizine (Xyzal) 5 mg twice a day  for 2 weeks if symptoms  decrease then take Levocetirizine (Xyzal) 5 mg once a day.) 60 tablet 5  . Probiotic Product (PROBIOTIC ADVANCED PO) Take 1 capsule by mouth daily.    Marland Kitchen sulfaSALAzine (AZULFIDINE) 500 MG tablet 1,000 mg daily.     Marland Kitchen SYNTHROID 75 MCG tablet Take 1 tablet (75 mcg total) by mouth daily before breakfast. 30 tablet 0   No current facility-administered medications for this visit.     Family History  Problem Relation Age of Onset  . Heart disease Father   . Hypertension Father   . Hyperlipidemia Father   . Eczema Father   . Heart disease Maternal Grandmother   . Diabetes Maternal Grandmother   . Breast cancer Maternal Grandmother 80  . Hypertension Maternal Grandmother   . Stroke Maternal Grandmother   . Heart disease Maternal Grandfather   . Lung cancer Maternal Grandfather   . Colon cancer Maternal Grandfather   . Brain cancer Maternal Grandfather   . Hypertension Maternal Grandfather   . Heart attack Maternal Grandfather   . Heart disease Paternal Grandmother   . Hypertension Paternal Grandmother   . Heart disease Paternal Grandfather   . Hypertension Paternal Grandfather   . Heart attack Paternal Grandfather   . Crohn's disease Brother   . Allergic rhinitis Neg Hx   . Angioedema  Neg Hx   . Asthma Neg Hx   . Immunodeficiency Neg Hx   . Urticaria Neg Hx     ROS:  Pertinent items are noted in HPI.  Otherwise, a comprehensive ROS was negative.  Exam:   BP 120/80   Pulse 70   Resp 16   Ht 5' 2.75" (1.594 m)   Wt 197 lb (89.4 kg)   LMP 11/08/2017 (Exact Date)   BMI 35.18 kg/m  Height: 5' 2.75" (159.4 cm) Ht Readings from Last 3 Encounters:  11/21/17 5' 2.75" (1.594 m)  11/03/17 5' 2.75" (1.594 m)  11/14/16 5' 2.75" (1.594 m)    General appearance: alert, cooperative and appears stated age Head: Normocephalic, without obvious abnormality, atraumatic Neck: no adenopathy, supple, symmetrical, trachea midline and thyroid normal to inspection and palpation Lungs: clear to  auscultation bilaterally Breasts: normal appearance, no masses or tenderness, No nipple retraction or dimpling, No nipple discharge or bleeding, No axillary or supraclavicular adenopathy Heart: regular rate and rhythm Abdomen: soft, non-tender; no masses,  no organomegaly Extremities: extremities normal, atraumatic, no cyanosis or edema Skin: Skin color, texture, turgor normal. No rashes or lesions Lymph nodes: Cervical, supraclavicular, and axillary nodes normal. No abnormal inguinal nodes palpated Neurologic: Grossly normal   Pelvic: External genitalia:  no lesions              Urethra:  normal appearing urethra with no masses, tenderness or lesions              Bartholin's and Skene's: normal                 Vagina: normal appearing vagina with normal color and discharge, no lesions              Cervix: multiparous appearance, no cervical motion tenderness and no lesions              Pap taken: Yes.   Bimanual Exam:  Uterus:  normal size, contour, position, consistency, mobility, non-tender              Adnexa: normal adnexa and no mass, fullness, tenderness               Rectovaginal: Confirms               Anus:  normal sphincter tone, no lesions  Chaperone present: yes  A:  Well Woman with normal exam  Contraception spouse vasectomy  Hypothyroid on stable medication  PCP manages screening labs  Colonoscopy due history polyps  P:   Reviewed health and wellness pertinent to exam  Aware of signs of change, but has not noted any. Will do lab and refill RX  Lab: TSH  Follow up with MD as indicated  Stressed importance  Pap smear: no   counseled on breast self exam, mammography screening, feminine hygiene, adequate intake of calcium and vitamin D, diet and exercise  return annually or prn  An After Visit Summary was printed and given to the patient.

## 2017-11-22 LAB — TSH: TSH: 4.04 u[IU]/mL (ref 0.450–4.500)

## 2017-11-23 ENCOUNTER — Other Ambulatory Visit: Payer: Self-pay | Admitting: Certified Nurse Midwife

## 2017-11-23 DIAGNOSIS — E039 Hypothyroidism, unspecified: Secondary | ICD-10-CM

## 2017-11-23 MED ORDER — SYNTHROID 75 MCG PO TABS: 75.0000 ug | ORAL_TABLET | Freq: Every day | ORAL | 12 refills | Status: DC

## 2017-11-24 MED FILL — SYNTHROID 75 MCG TABLET: 75 | 30 days supply | Qty: 30 | Fill #0

## 2017-11-26 DIAGNOSIS — J3081 Allergic rhinitis due to animal (cat) (dog) hair and dander: Secondary | ICD-10-CM | POA: Diagnosis not present

## 2017-11-26 DIAGNOSIS — R430 Anosmia: Secondary | ICD-10-CM | POA: Diagnosis not present

## 2017-11-26 DIAGNOSIS — J301 Allergic rhinitis due to pollen: Secondary | ICD-10-CM | POA: Diagnosis not present

## 2017-11-26 DIAGNOSIS — J342 Deviated nasal septum: Secondary | ICD-10-CM | POA: Diagnosis not present

## 2017-12-05 MED FILL — PEG-3350 SOLUTION: 420 | 2 days supply | Qty: 4000 | Fill #0

## 2017-12-23 DIAGNOSIS — D126 Benign neoplasm of colon, unspecified: Secondary | ICD-10-CM | POA: Diagnosis not present

## 2017-12-23 DIAGNOSIS — Z86010 Personal history of colonic polyps: Secondary | ICD-10-CM | POA: Diagnosis not present

## 2017-12-23 DIAGNOSIS — K649 Unspecified hemorrhoids: Secondary | ICD-10-CM | POA: Diagnosis not present

## 2017-12-23 LAB — HM COLONOSCOPY

## 2018-01-02 MED FILL — SYNTHROID 75 MCG TABLET: 75 | 30 days supply | Qty: 30 | Fill #1

## 2018-01-05 ENCOUNTER — Ambulatory Visit (INDEPENDENT_AMBULATORY_CARE_PROVIDER_SITE_OTHER): Payer: Self-pay | Admitting: Family Medicine

## 2018-01-05 VITALS — BP 122/80 | HR 89 | Temp 98.2°F | Resp 18 | Wt 199.8 lb

## 2018-01-05 DIAGNOSIS — R059 Cough, unspecified: Secondary | ICD-10-CM

## 2018-01-05 DIAGNOSIS — R05 Cough: Secondary | ICD-10-CM

## 2018-01-05 DIAGNOSIS — H9209 Otalgia, unspecified ear: Secondary | ICD-10-CM

## 2018-01-05 DIAGNOSIS — R0981 Nasal congestion: Secondary | ICD-10-CM

## 2018-01-05 MED ORDER — AZELASTINE HCL 0.1 % NA SOLN: 1.0000 | Freq: Two times a day (BID) | NASAL | 0 refills | Status: DC

## 2018-01-05 MED ORDER — BENZONATATE 100 MG PO CAPS: 200.0000 mg | ORAL_CAPSULE | Freq: Three times a day (TID) | ORAL | 0 refills | Status: DC

## 2018-01-05 MED ORDER — PSEUDOEPH-BROMPHEN-DM 30-2-10 MG/5ML PO SYRP: 10.0000 mL | ORAL_SOLUTION | Freq: Four times a day (QID) | ORAL | 0 refills | Status: DC | PRN

## 2018-01-05 MED FILL — BENZONATATE 100 MG CAP: 100 | 4 days supply | Qty: 20 | Fill #0

## 2018-01-05 MED FILL — AZELASTINE HCL 137 MCG/SPRA: 137 | 50 days supply | Qty: 30 | Fill #0

## 2018-01-05 MED FILL — BROMPHENIR-PSEUDOEPHED-DM S: 30-2-10 | 3 days supply | Qty: 120 | Fill #0

## 2018-01-05 NOTE — Progress Notes (Signed)
Julie Yang is a 47 y.o. female who presents today with concerns of cough and congestion with ear pain for the last 3 days. The patient has not used any over the counnter medication for this condition up to thespian but reports that earlier in the year she experienced multiple ear infections and now has ear pain and does not want to wait until it is too late. She denies any fever of known sick contacts.  Review of Systems  Constitutional: Negative for chills, fever and malaise/fatigue.  HENT: Positive for ear pain and sore throat. Negative for congestion, ear discharge and sinus pain.   Eyes: Negative.   Respiratory: Positive for cough. Negative for sputum production and shortness of breath.   Cardiovascular: Negative.  Negative for chest pain.  Gastrointestinal: Negative for abdominal pain, diarrhea, nausea and vomiting.  Genitourinary: Negative for dysuria, frequency, hematuria and urgency.  Musculoskeletal: Negative for myalgias.  Skin: Negative.   Neurological: Negative for headaches.  Endo/Heme/Allergies: Negative.   Psychiatric/Behavioral: Negative.     O: There were no vitals filed for this visit.   Physical Exam  Constitutional: She is oriented to person, place, and time. Vital signs are normal. She appears well-developed and well-nourished. She is active.  Non-toxic appearance. She does not have a sickly appearance.  HENT:  Head: Normocephalic.  Right Ear: Hearing, tympanic membrane and external ear normal.  Left Ear: Hearing, tympanic membrane and external ear normal.  Nose: Nose normal.  Mouth/Throat: Uvula is midline and oropharynx is clear and moist.  Increased vasculairty and mild canal erythema- TM is intact and WNL no evidence of fluid or drainage found  Neck: Normal range of motion. Neck supple.  Cardiovascular: Normal rate, regular rhythm, normal heart sounds and normal pulses.  Pulmonary/Chest: Effort normal and breath sounds normal.  Abdominal: Soft. Bowel  sounds are normal.  Musculoskeletal: Normal range of motion.  Lymphadenopathy:       Head (right side): No submental and no submandibular adenopathy present.       Head (left side): No submental and no submandibular adenopathy present.    She has no cervical adenopathy.  Neurological: She is alert and oriented to person, place, and time.  Psychiatric: She has a normal mood and affect.  Vitals reviewed.    A: 1. Cough   2. Nasal congestion   3. Otalgia, unspecified laterality      P: Discussed with patient the use of over-the-counter medications like Tylenol and Motrin for ear pain and general fatigue.  Advised to use the medications prescribed today as directed.  Patient encouraged to follow-up within 48 hours if symptoms not resolved.  May benefit from face-to-face reassessment of both ears no evidence of infection today on exam.  May consider antibiotics if condition not improved.  Discussed exam findings, diagnosis etiology and medication use and indications reviewed with patient. Follow- Up and discharge instructions provided. No emergent/urgent issues found on exam.  Patient verbalized understanding of information provided and agrees with plan of care (POC), all questions answered.  1. Cough - brompheniramine-pseudoephedrine-DM 30-2-10 MG/5ML syrup; Take 10 mLs by mouth 4 (four) times daily as needed (with full glass of water). - benzonatate (TESSALON) 100 MG capsule; Take 2 capsules (200 mg total) by mouth 3 (three) times daily.  2. Nasal congestion - brompheniramine-pseudoephedrine-DM 30-2-10 MG/5ML syrup; Take 10 mLs by mouth 4 (four) times daily as needed (with full glass of water). - azelastine (ASTELIN) 0.1 % nasal spray; Place 1 spray into both nostrils 2 (two)  times daily. Use in each nostril as directed  3. Otalgia, unspecified laterality Tylenol/Motrin PRN

## 2018-01-05 NOTE — Patient Instructions (Signed)
Cough, Adult  Coughing is a reflex that clears your throat and your airways. Coughing helps to heal and protect your lungs. It is normal to cough occasionally, but a cough that happens with other symptoms or lasts a long time may be a sign of a condition that needs treatment. A cough may last only 2-3 weeks (acute), or it may last longer than 8 weeks (chronic).  What are the causes?  Coughing is commonly caused by:   Breathing in substances that irritate your lungs.   A viral or bacterial respiratory infection.   Allergies.   Asthma.   Postnasal drip.   Smoking.   Acid backing up from the stomach into the esophagus (gastroesophageal reflux).   Certain medicines.   Chronic lung problems, including COPD (or rarely, lung cancer).   Other medical conditions such as heart failure.    Follow these instructions at home:  Pay attention to any changes in your symptoms. Take these actions to help with your discomfort:   Take medicines only as told by your health care provider.  ? If you were prescribed an antibiotic medicine, take it as told by your health care provider. Do not stop taking the antibiotic even if you start to feel better.  ? Talk with your health care provider before you take a cough suppressant medicine.   Drink enough fluid to keep your urine clear or pale yellow.   If the air is dry, use a cold steam vaporizer or humidifier in your bedroom or your home to help loosen secretions.   Avoid anything that causes you to cough at work or at home.   If your cough is worse at night, try sleeping in a semi-upright position.   Avoid cigarette smoke. If you smoke, quit smoking. If you need help quitting, ask your health care provider.   Avoid caffeine.   Avoid alcohol.   Rest as needed.    Contact a health care provider if:   You have new symptoms.   You cough up pus.   Your cough does not get better after 2-3 weeks, or your cough gets worse.   You cannot control your cough with suppressant  medicines and you are losing sleep.   You develop pain that is getting worse or pain that is not controlled with pain medicines.   You have a fever.   You have unexplained weight loss.   You have night sweats.  Get help right away if:   You cough up blood.   You have difficulty breathing.   Your heartbeat is very fast.  This information is not intended to replace advice given to you by your health care provider. Make sure you discuss any questions you have with your health care provider.  Document Released: 10/12/2010 Document Revised: 09/21/2015 Document Reviewed: 06/22/2014  Elsevier Interactive Patient Education  2018 [employer]

## 2018-02-23 MED FILL — SYNTHROID 75 MCG TABLET: 75 | 30 days supply | Qty: 30 | Fill #2

## 2018-04-14 ENCOUNTER — Ambulatory Visit (INDEPENDENT_AMBULATORY_CARE_PROVIDER_SITE_OTHER): Payer: Self-pay | Admitting: Family Medicine

## 2018-04-14 ENCOUNTER — Encounter: Payer: Self-pay | Admitting: Family Medicine

## 2018-04-14 VITALS — BP 122/82 | HR 70 | Temp 98.1°F | Wt 205.6 lb

## 2018-04-14 DIAGNOSIS — J329 Chronic sinusitis, unspecified: Secondary | ICD-10-CM

## 2018-04-14 DIAGNOSIS — R6889 Other general symptoms and signs: Secondary | ICD-10-CM

## 2018-04-14 LAB — POCT INFLUENZA A/B
INFLUENZA A, POC: NEGATIVE
INFLUENZA B, POC: NEGATIVE

## 2018-04-14 MED ORDER — PSEUDOEPH-BROMPHEN-DM 30-2-10 MG/5ML PO SYRP: 10.0000 mL | ORAL_SOLUTION | Freq: Three times a day (TID) | ORAL | 0 refills | Status: DC | PRN

## 2018-04-14 MED ORDER — AMOXICILLIN-POT CLAVULANATE 875-125 MG PO TABS: 1.0000 | ORAL_TABLET | Freq: Two times a day (BID) | ORAL | 0 refills | Status: AC

## 2018-04-14 NOTE — Progress Notes (Signed)
Najla Aughenbaugh is a 47 y.o. female who presents today with concerns of flu like symptoms. She reports that she has not been feeling good for a few days and that her spouse insisted that she come and be evaluated. She reports low grade temp, chills and fatigue, She additionally reports a mild dry cough. She reports a history of seasonal allergies and hypothyroid and reports both conditions are well managed and stable.  Review of Systems  Constitutional: Positive for chills, fever and malaise/fatigue.  HENT: Positive for congestion and sinus pain. Negative for ear discharge, ear pain and sore throat.   Eyes: Negative.   Respiratory: Positive for cough. Negative for sputum production and shortness of breath.   Cardiovascular: Negative.  Negative for chest pain.  Gastrointestinal: Negative for abdominal pain, diarrhea, nausea and vomiting.  Genitourinary: Negative for dysuria, frequency, hematuria and urgency.  Musculoskeletal: Negative for myalgias.  Skin: Negative.   Neurological: Negative for headaches.  Endo/Heme/Allergies: Negative.   Psychiatric/Behavioral: Negative.     O: Vitals:   04/14/18 1840  BP: 122/82  Pulse: 70  Temp: 98.1 F (36.7 C)  SpO2: 98%     Physical Exam Vitals signs reviewed.  Constitutional:      Appearance: She is well-developed. She is not toxic-appearing.  HENT:     Head: Normocephalic.     Right Ear: Hearing, ear canal and external ear normal. A middle ear effusion is present. Tympanic membrane is injected and erythematous.     Left Ear: Hearing, ear canal and external ear normal. Tympanic membrane is injected and erythematous.     Nose: Congestion and rhinorrhea present.     Right Sinus: Maxillary sinus tenderness and frontal sinus tenderness present.     Left Sinus: Maxillary sinus tenderness and frontal sinus tenderness present.     Mouth/Throat:     Pharynx: Uvula midline.  Neck:     Musculoskeletal: Normal range of motion and neck supple.   Cardiovascular:     Rate and Rhythm: Normal rate and regular rhythm.     Pulses: Normal pulses.     Heart sounds: Normal heart sounds.  Pulmonary:     Effort: Pulmonary effort is normal.     Breath sounds: Normal breath sounds.  Abdominal:     General: Bowel sounds are normal.     Palpations: Abdomen is soft.  Musculoskeletal: Normal range of motion.  Lymphadenopathy:     Head:     Right side of head: No submental or submandibular adenopathy.     Left side of head: No submental or submandibular adenopathy.     Cervical: No cervical adenopathy.  Neurological:     Mental Status: She is alert and oriented to person, place, and time.    : 1. Flu-like symptoms   2. Sinusitis, unspecified chronicity, unspecified location    P: Discussed exam findings, diagnosis etiology and medication use and indications reviewed with patient. Follow- Up and discharge instructions provided. No emergent/urgent issues found on exam.  Patient verbalized understanding of information provided and agrees with plan of care (POC), all questions answered.  1. Flu-like symptoms - POCT Influenza A/B Results for orders placed or performed in visit on 04/14/18 (from the past 24 hour(s))  POCT Influenza A/B     Status: None   Collection Time: 04/14/18  6:49 PM  Result Value Ref Range   Influenza A, POC Negative Negative   Influenza B, POC Negative Negative     2. Sinusitis, unspecified chronicity, unspecified location Bilateral  ear erythema and sinus congestion and pressure- will treat with abx. With decongestant for supportive care. - amoxicillin-clavulanate (AUGMENTIN) 875-125 MG tablet; Take 1 tablet by mouth 2 (two) times daily for 7 days. - brompheniramine-pseudoephedrine-DM 30-2-10 MG/5ML syrup; Take 10 mLs by mouth 3 (three) times daily as needed.

## 2018-04-14 NOTE — Patient Instructions (Signed)

## 2018-05-15 MED FILL — SYNTHROID 75 MCG TABLET: 75 | 30 days supply | Qty: 30 | Fill #3

## 2018-05-27 ENCOUNTER — Other Ambulatory Visit: Payer: Self-pay | Admitting: Certified Nurse Midwife

## 2018-05-27 DIAGNOSIS — D249 Benign neoplasm of unspecified breast: Secondary | ICD-10-CM

## 2018-05-27 DIAGNOSIS — L821 Other seborrheic keratosis: Secondary | ICD-10-CM | POA: Diagnosis not present

## 2018-05-27 DIAGNOSIS — D229 Melanocytic nevi, unspecified: Secondary | ICD-10-CM | POA: Diagnosis not present

## 2018-06-21 MED FILL — SYNTHROID 75 MCG TABLET: 75 | 30 days supply | Qty: 30 | Fill #4

## 2018-07-16 ENCOUNTER — Ambulatory Visit
Admission: RE | Admit: 2018-07-16 | Discharge: 2018-07-16 | Disposition: A | Discharge status: Home / Self Care | Payer: 59 | Source: Ambulatory Visit | Attending: Certified Nurse Midwife | Admitting: Certified Nurse Midwife

## 2018-07-16 ENCOUNTER — Other Ambulatory Visit: Payer: Self-pay

## 2018-07-16 DIAGNOSIS — D249 Benign neoplasm of unspecified breast: Secondary | ICD-10-CM | POA: Diagnosis not present

## 2018-07-16 MED FILL — SYNTHROID 75 MCG TABLET: 75 | 30 days supply | Qty: 30 | Fill #5

## 2018-07-17 ENCOUNTER — Telehealth: Payer: Self-pay

## 2018-07-17 NOTE — Telephone Encounter (Signed)
Left message for call back.

## 2018-07-17 NOTE — Telephone Encounter (Signed)
Patient notified of results. Recall taken out

## 2018-07-17 NOTE — Telephone Encounter (Signed)
-----   Message from Regina Eck, CNM sent at 07/17/2018  8:10 AM EDT ----- Mammogram shows probably benign fibroadenoma in right breat unchanged for 2 years.  No evidence of malignancy in either breast. Birads 2 benign Density C Return to yearly screening 3 D mammogram

## 2018-07-24 MED FILL — SYNTHROID 75 MCG TABLET: 75 | 30 days supply | Qty: 30 | Fill #6

## 2018-10-08 DIAGNOSIS — R635 Abnormal weight gain: Secondary | ICD-10-CM | POA: Diagnosis not present

## 2018-10-08 DIAGNOSIS — E611 Iron deficiency: Secondary | ICD-10-CM | POA: Diagnosis not present

## 2018-10-08 DIAGNOSIS — N951 Menopausal and female climacteric states: Secondary | ICD-10-CM | POA: Diagnosis not present

## 2018-10-08 DIAGNOSIS — R7989 Other specified abnormal findings of blood chemistry: Secondary | ICD-10-CM | POA: Diagnosis not present

## 2018-10-12 DIAGNOSIS — Z1331 Encounter for screening for depression: Secondary | ICD-10-CM | POA: Diagnosis not present

## 2018-10-12 DIAGNOSIS — Z1339 Encounter for screening examination for other mental health and behavioral disorders: Secondary | ICD-10-CM | POA: Diagnosis not present

## 2018-10-12 DIAGNOSIS — E786 Lipoprotein deficiency: Secondary | ICD-10-CM | POA: Diagnosis not present

## 2018-10-12 DIAGNOSIS — N951 Menopausal and female climacteric states: Secondary | ICD-10-CM | POA: Diagnosis not present

## 2018-10-12 DIAGNOSIS — R7989 Other specified abnormal findings of blood chemistry: Secondary | ICD-10-CM | POA: Diagnosis not present

## 2018-10-12 DIAGNOSIS — Z6836 Body mass index (BMI) 36.0-36.9, adult: Secondary | ICD-10-CM | POA: Diagnosis not present

## 2018-10-12 DIAGNOSIS — E611 Iron deficiency: Secondary | ICD-10-CM | POA: Diagnosis not present

## 2018-10-15 DIAGNOSIS — E786 Lipoprotein deficiency: Secondary | ICD-10-CM | POA: Diagnosis not present

## 2018-10-15 DIAGNOSIS — Z6835 Body mass index (BMI) 35.0-35.9, adult: Secondary | ICD-10-CM | POA: Diagnosis not present

## 2018-10-21 MED FILL — SYNTHROID 75 MCG TABLET: 75 | 30 days supply | Qty: 30 | Fill #7

## 2018-10-22 DIAGNOSIS — E611 Iron deficiency: Secondary | ICD-10-CM | POA: Diagnosis not present

## 2018-10-22 DIAGNOSIS — Z6835 Body mass index (BMI) 35.0-35.9, adult: Secondary | ICD-10-CM | POA: Diagnosis not present

## 2018-10-29 DIAGNOSIS — Z6835 Body mass index (BMI) 35.0-35.9, adult: Secondary | ICD-10-CM | POA: Diagnosis not present

## 2018-10-29 DIAGNOSIS — R7989 Other specified abnormal findings of blood chemistry: Secondary | ICD-10-CM | POA: Diagnosis not present

## 2018-11-05 DIAGNOSIS — Z6834 Body mass index (BMI) 34.0-34.9, adult: Secondary | ICD-10-CM | POA: Diagnosis not present

## 2018-11-05 DIAGNOSIS — R7989 Other specified abnormal findings of blood chemistry: Secondary | ICD-10-CM | POA: Diagnosis not present

## 2018-11-05 DIAGNOSIS — E611 Iron deficiency: Secondary | ICD-10-CM | POA: Diagnosis not present

## 2018-11-12 DIAGNOSIS — E786 Lipoprotein deficiency: Secondary | ICD-10-CM | POA: Diagnosis not present

## 2018-11-12 DIAGNOSIS — Z6833 Body mass index (BMI) 33.0-33.9, adult: Secondary | ICD-10-CM | POA: Diagnosis not present

## 2018-11-24 ENCOUNTER — Other Ambulatory Visit: Payer: Self-pay | Admitting: Certified Nurse Midwife

## 2018-11-24 DIAGNOSIS — E039 Hypothyroidism, unspecified: Secondary | ICD-10-CM

## 2018-11-24 MED FILL — SYNTHROID 75 MCG TABLET: 75 | 30 days supply | Qty: 30 | Fill #0

## 2018-11-24 NOTE — Telephone Encounter (Signed)
Patient sent the following correspondence through [facility]. Routing to triage to assist patient with request.  Hi there - [facility] will probably call, but my Synthroid prescription has run out. I just need to get enough of a refill to last me until my August 12 appointment. Thanks!    Julie Yang  02-03-1971  [phone]

## 2018-11-24 NOTE — Telephone Encounter (Signed)
Medication refill request: Synthroid Last AEX:  11/21/17 Next AEX: 12/09/18 Last MMG (if hormonal medication request): 07/16/18 Bi-rads 2 benign  Refill authorized: #[id number] with 0 RF

## 2018-11-26 DIAGNOSIS — E611 Iron deficiency: Secondary | ICD-10-CM | POA: Diagnosis not present

## 2018-11-26 DIAGNOSIS — Z6833 Body mass index (BMI) 33.0-33.9, adult: Secondary | ICD-10-CM | POA: Diagnosis not present

## 2018-11-26 DIAGNOSIS — K5900 Constipation, unspecified: Secondary | ICD-10-CM | POA: Diagnosis not present

## 2018-12-01 ENCOUNTER — Ambulatory Visit: Payer: 59 | Admitting: Certified Nurse Midwife

## 2018-12-03 DIAGNOSIS — R7989 Other specified abnormal findings of blood chemistry: Secondary | ICD-10-CM | POA: Diagnosis not present

## 2018-12-03 DIAGNOSIS — E611 Iron deficiency: Secondary | ICD-10-CM | POA: Diagnosis not present

## 2018-12-03 DIAGNOSIS — Z6833 Body mass index (BMI) 33.0-33.9, adult: Secondary | ICD-10-CM | POA: Diagnosis not present

## 2018-12-08 ENCOUNTER — Other Ambulatory Visit: Payer: Self-pay

## 2018-12-08 NOTE — Progress Notes (Signed)
48 y.o. G25P1001 Married  Caucasian Fe here for annual exam. Periods normal, with slightly lighter, no change in cramping mild. Thyroid medication working well, no issues. Plans to establish with another PCP, would like labs here today. Busy with exercising daily and some weight training. Complaining of slight rash on lower abdomen from waist belt with running. Slight itching with, no exudate. No other health issues today.  Patient's last menstrual period was 11/16/2018 (exact date).          Sexually active: Yes.    The current method of family planning is vasectomy.    Exercising: Yes.    walking & weights Smoker:  no  Review of Systems  Constitutional: Negative.   HENT: Negative.   Eyes: Negative.   Respiratory: Negative.   Cardiovascular: Negative.   Gastrointestinal: Negative.   Genitourinary: Negative.   Musculoskeletal: Negative.   Skin:       Rash on abdomen  Neurological: Negative.   Endo/Heme/Allergies: Negative.   Psychiatric/Behavioral: Negative.     Health Maintenance: Pap:  11-14-16 neg History of Abnormal Pap: yes MMG:  06/2018 bilateral & rt breast u/s category c density birads 2:neg Self Breast exams: yes Colonoscopy:  2019 f/u 8yrs per patient BMD:   none TDaP:  2017 Shingles: not done Pneumonia: not done Hep C and HIV: hep c neg 2017, HIV neg yrs ago Labs: yes   reports that she has never smoked. She has never used smokeless tobacco. She reports current alcohol use. She reports that she does not use drugs.  Past Medical History:  Diagnosis Date  . Abnormal Pap smear of cervix    1990s  . Anxiety   . Autoimmune disorder ([place])   . Genital warts   . Thyroid disease   . Urticaria     Past Surgical History:  Procedure Laterality Date  . CESAREAN SECTION    . CYST EXCISION     from chest, benign    Current Outpatient Medications  Medication Sig Dispense Refill  . Cholecalciferol (VITAMIN D) 2000 units CAPS Take 1 capsule by mouth daily.    .  Multiple Vitamins-Calcium (ONE-A-DAY WOMENS FORMULA) TABS     . Probiotic Product (PROBIOTIC ADVANCED PO) Take 1 capsule by mouth daily.    Marland Kitchen SYNTHROID 75 MCG tablet TAKE 1 TABLET (75 MCG TOTAL) BY MOUTH DAILY BEFORE BREAKFAST. 30 tablet 0   No current facility-administered medications for this visit.     Family History  Problem Relation Age of Onset  . Heart disease Father   . Hypertension Father   . Hyperlipidemia Father   . Eczema Father   . Heart disease Maternal Grandmother   . Diabetes Maternal Grandmother   . Breast cancer Maternal Grandmother 80  . Hypertension Maternal Grandmother   . Stroke Maternal Grandmother   . Heart disease Maternal Grandfather   . Lung cancer Maternal Grandfather   . Colon cancer Maternal Grandfather   . Brain cancer Maternal Grandfather   . Hypertension Maternal Grandfather   . Heart attack Maternal Grandfather   . Heart disease Paternal Grandmother   . Hypertension Paternal Grandmother   . Heart disease Paternal Grandfather   . Hypertension Paternal Grandfather   . Heart attack Paternal Grandfather   . Crohn's disease Brother   . Allergic rhinitis Neg Hx   . Angioedema Neg Hx   . Asthma Neg Hx   . Immunodeficiency Neg Hx   . Urticaria Neg Hx     ROS:  Pertinent items  are noted in HPI.  Otherwise, a comprehensive ROS was negative.  Exam:   BP 110/72   Pulse 64   Temp (!) 97.2 F (36.2 C) (Skin)   Resp 16   Ht 5' 2.75" (1.594 m)   LMP 11/16/2018 (Exact Date)   BMI 36.71 kg/m  Height: 5' 2.75" (159.4 cm) Ht Readings from Last 3 Encounters:  12/09/18 5' 2.75" (1.594 m)  11/21/17 5' 2.75" (1.594 m)  11/03/17 5' 2.75" (1.594 m)    General appearance: alert, cooperative and appears stated age Head: Normocephalic, without obvious abnormality, atraumatic Neck: no adenopathy, supple, symmetrical, trachea midline and thyroid normal to inspection and palpation Lungs: clear to auscultation bilaterally Breasts: normal appearance, no  masses or tenderness, No nipple retraction or dimpling, No nipple discharge or bleeding, No axillary or supraclavicular adenopathy, shotty feel bilateral no change Heart: regular rate and rhythm Abdomen: soft, non-tender; no masses,  no organomegaly, lower area, fine pink macular rash noted, no exudate or weeping. Extremities: extremities normal, atraumatic, no cyanosis or edema Skin: Skin color, texture, turgor normal. No rashes or lesions  Lymph nodes: Cervical, supraclavicular, and axillary nodes normal. No abnormal inguinal nodes palpated Neurologic: Grossly normal   Pelvic: External genitalia:  no lesions, normal female              Urethra:  normal appearing urethra with no masses, tenderness or lesions              Bartholin's and Skene's: normal                 Vagina: normal appearing vagina with normal color and discharge, no lesions              Cervix: no cervical motion tenderness, no lesions and normal appearance              Pap taken: No. Bimanual Exam:  Uterus:  normal size, contour, position, consistency, mobility, non-tender and anteverted              Adnexa: normal adnexa and no mass, fullness, tenderness               Rectovaginal: Confirms               Anus:  normal sphincter tone, no lesions, hemorrhoid tags only  Chaperone present: yes  A:  Well Woman with normal exam  Contraception  Spouse vasectomy  Hypothyroid medication working well  Probable yeast dermatitis on abdomen from moisture  Screening labs    P:   Reviewed health and wellness pertinent to exam  Discussed importance of notifying if period change.  Discussed symptoms if medication is not working well and need to advise. Will refill once lab in.  Discussed yeast appearance and obtaining Monistat derm  OTC and apply externally to area bid. Use cornstarch dusting to area to prevent reoccurrence once cleared. Questions addressed. Will call if no improvement.  Labs: TSH, CMP,Lipid panel, Vitamin D,  CBC  Pap smear: no   counseled on breast self exam, mammography screening, feminine hygiene, adequate intake of calcium and vitamin D, diet and exercise  return annually or prn  An After Visit Summary was printed and given to the patient.

## 2018-12-09 ENCOUNTER — Ambulatory Visit: Payer: 59 | Admitting: Certified Nurse Midwife

## 2018-12-09 ENCOUNTER — Other Ambulatory Visit: Payer: Self-pay

## 2018-12-09 ENCOUNTER — Encounter: Payer: Self-pay | Admitting: Certified Nurse Midwife

## 2018-12-09 VITALS — BP 110/72 | HR 64 | Temp 97.2°F | Resp 16 | Ht 62.75 in

## 2018-12-09 DIAGNOSIS — E559 Vitamin D deficiency, unspecified: Secondary | ICD-10-CM | POA: Diagnosis not present

## 2018-12-09 DIAGNOSIS — Z0000 Encounter for general adult medical examination without abnormal findings: Secondary | ICD-10-CM

## 2018-12-09 DIAGNOSIS — E038 Other specified hypothyroidism: Secondary | ICD-10-CM | POA: Diagnosis not present

## 2018-12-09 DIAGNOSIS — Z01419 Encounter for gynecological examination (general) (routine) without abnormal findings: Secondary | ICD-10-CM

## 2018-12-09 NOTE — Patient Instructions (Signed)

## 2018-12-10 ENCOUNTER — Other Ambulatory Visit: Payer: Self-pay | Admitting: Certified Nurse Midwife

## 2018-12-10 DIAGNOSIS — Z6832 Body mass index (BMI) 32.0-32.9, adult: Secondary | ICD-10-CM | POA: Diagnosis not present

## 2018-12-10 DIAGNOSIS — E039 Hypothyroidism, unspecified: Secondary | ICD-10-CM

## 2018-12-10 DIAGNOSIS — E611 Iron deficiency: Secondary | ICD-10-CM | POA: Diagnosis not present

## 2018-12-10 LAB — COMPREHENSIVE METABOLIC PANEL
ALT: 11 IU/L (ref 0–32)
AST: 14 IU/L (ref 0–40)
Albumin/Globulin Ratio: 1.6 (ref 1.2–2.2)
Albumin: 4.2 g/dL (ref 3.8–4.8)
Alkaline Phosphatase: 61 IU/L (ref 39–117)
BUN/Creatinine Ratio: 21 (ref 9–23)
BUN: 14 mg/dL (ref 6–24)
Bilirubin Total: 0.3 mg/dL (ref 0.0–1.2)
CO2: 22 mmol/L (ref 20–29)
Calcium: 9.1 mg/dL (ref 8.7–10.2)
Chloride: 104 mmol/L (ref 96–106)
Creatinine, Ser: 0.66 mg/dL (ref 0.57–1.00)
GFR calc Af Amer: 123 mL/min/{1.73_m2} (ref >59)
GFR calc non Af Amer: 107 mL/min/{1.73_m2} (ref >59)
Globulin, Total: 2.6 g/dL (ref 1.5–4.5)
Glucose: 82 mg/dL (ref 65–99)
Potassium: 4.2 mmol/L (ref 3.5–5.2)
Sodium: 138 mmol/L (ref 134–144)
Total Protein: 6.8 g/dL (ref 6.0–8.5)

## 2018-12-10 LAB — LIPID PANEL
Chol/HDL Ratio: 3.6 ratio (ref 0.0–4.4)
Cholesterol, Total: 133 mg/dL (ref 100–199)
HDL: 37 mg/dL — ABNORMAL LOW (ref >39)
LDL Calculated: 85 mg/dL (ref 0–99)
Triglycerides: 53 mg/dL (ref 0–149)
VLDL Cholesterol Cal: 11 mg/dL (ref 5–40)

## 2018-12-10 LAB — CBC
Hematocrit: 43.7 % (ref 34.0–46.6)
Hemoglobin: 14 g/dL (ref 11.1–15.9)
MCH: 31.2 pg (ref 26.6–33.0)
MCHC: 32 g/dL (ref 31.5–35.7)
MCV: 97 fL (ref 79–97)
Platelets: 325 10*3/uL (ref 150–450)
RBC: 4.49 x10E6/uL (ref 3.77–5.28)
RDW: 12.5 % (ref 11.7–15.4)
WBC: 8.7 10*3/uL (ref 3.4–10.8)

## 2018-12-10 LAB — VITAMIN D 25 HYDROXY (VIT D DEFICIENCY, FRACTURES): Vit D, 25-Hydroxy: 51.5 ng/mL (ref 30.0–100.0)

## 2018-12-10 LAB — TSH: TSH: 2.69 u[IU]/mL (ref 0.450–4.500)

## 2018-12-10 MED ORDER — SYNTHROID 75 MCG PO TABS: 75.0000 ug | ORAL_TABLET | Freq: Every day | ORAL | 3 refills | Status: DC

## 2018-12-15 ENCOUNTER — Telehealth: Payer: 59 | Admitting: Family

## 2018-12-15 DIAGNOSIS — L259 Unspecified contact dermatitis, unspecified cause: Secondary | ICD-10-CM | POA: Diagnosis not present

## 2018-12-15 MED ORDER — TRIAMCINOLONE ACETONIDE 0.1 % EX CREA: 1.0000 "application " | TOPICAL_CREAM | Freq: Two times a day (BID) | CUTANEOUS | 0 refills | Status: DC

## 2018-12-15 MED FILL — TRIAMCINOLONE 0.1% CREAM: 0.1 | 15 days supply | Qty: 30 | Fill #0

## 2018-12-15 NOTE — Progress Notes (Signed)
E Visit for Rash  We are sorry that you are not feeling well. Here is how we plan to help!  Based on what you shared with me it looks like you have contact dermatitis.  Contact dermatitis is a skin rash caused by something that touches the skin and causes irritation or inflammation.  Your skin may be red, swollen, dry, cracked, and itch.  The rash should go away in a few days but can last a few weeks.  If you get a rash, it's important to figure out what caused it so the irritant can be avoided in the future.  Triamcinolone cream applied twice daily    HOME CARE:   Take cool showers and avoid direct sunlight.  Apply cool compress or wet dressings.  Take a bath in an oatmeal bath.  Sprinkle content of one Aveeno packet under running faucet with comfortably warm water.  Bathe for 15-20 minutes, 1-2 times daily.  Pat dry with a towel. Do not rub the rash.  Use hydrocortisone cream.  Take an antihistamine like Benadryl for widespread rashes that itch.  The adult dose of Benadryl is 25-50 mg by mouth 4 times daily.  Caution:  This type of medication may cause sleepiness.  Do not drink alcohol, drive, or operate dangerous machinery while taking antihistamines.  Do not take these medications if you have prostate enlargement.  Read package instructions thoroughly on all medications that you take.  GET HELP RIGHT AWAY IF:   Symptoms don't go away after treatment.  Severe itching that persists.  If you rash spreads or swells.  If you rash begins to smell.  If it blisters and opens or develops a yellow-brown crust.  You develop a fever.  You have a sore throat.  You become short of breath.  MAKE SURE YOU:  Understand these instructions. Will watch your condition. Will get help right away if you are not doing well or get worse.  Thank you for choosing an e-visit. Your e-visit answers were reviewed by a board certified advanced clinical practitioner to complete your personal  care plan. Depending upon the condition, your plan could have included both over the counter or prescription medications. Please review your pharmacy choice. Be sure that the pharmacy you have chosen is open so that you can pick up your prescription now.  If there is a problem you may message your provider in [place] to have the prescription routed to another pharmacy. Your safety is important to [place]. If you have drug allergies check your prescription carefully.  For the next 24 hours, you can use MyChart to ask questions about today's visit, request a non-urgent call back, or ask for a work or school excuse from your e-visit provider. You will get an email in the next two days asking about your experience. I hope that your e-visit has been valuable and will speed your recovery.     Greater than 5 minutes, yet less than 10 minutes of time have been spent researching, coordinating, and implementing care for this patient today.  Thank you for the details you included in the comment boxes. Those details are very helpful in determining the best course of treatment for you and help [place] to provide the best care.

## 2018-12-17 DIAGNOSIS — Z6832 Body mass index (BMI) 32.0-32.9, adult: Secondary | ICD-10-CM | POA: Diagnosis not present

## 2018-12-17 DIAGNOSIS — E611 Iron deficiency: Secondary | ICD-10-CM | POA: Diagnosis not present

## 2018-12-24 DIAGNOSIS — R7989 Other specified abnormal findings of blood chemistry: Secondary | ICD-10-CM | POA: Diagnosis not present

## 2018-12-24 DIAGNOSIS — E786 Lipoprotein deficiency: Secondary | ICD-10-CM | POA: Diagnosis not present

## 2018-12-24 DIAGNOSIS — Z6832 Body mass index (BMI) 32.0-32.9, adult: Secondary | ICD-10-CM | POA: Diagnosis not present

## 2018-12-25 DIAGNOSIS — Z23 Encounter for immunization: Secondary | ICD-10-CM | POA: Diagnosis not present

## 2018-12-28 ENCOUNTER — Encounter: Payer: Self-pay | Admitting: Podiatry

## 2018-12-28 ENCOUNTER — Ambulatory Visit (INDEPENDENT_AMBULATORY_CARE_PROVIDER_SITE_OTHER): Payer: 59

## 2018-12-28 ENCOUNTER — Other Ambulatory Visit: Payer: Self-pay

## 2018-12-28 ENCOUNTER — Ambulatory Visit: Payer: 59 | Admitting: Podiatry

## 2018-12-28 VITALS — BP 119/66

## 2018-12-28 DIAGNOSIS — M722 Plantar fascial fibromatosis: Secondary | ICD-10-CM

## 2018-12-28 DIAGNOSIS — M779 Enthesopathy, unspecified: Secondary | ICD-10-CM | POA: Diagnosis not present

## 2018-12-28 DIAGNOSIS — M79672 Pain in left foot: Secondary | ICD-10-CM

## 2018-12-28 MED ORDER — DICLOFENAC SODIUM 1 % TD GEL: 2.0000 g | Freq: Four times a day (QID) | TRANSDERMAL | 2 refills | Status: DC

## 2018-12-28 MED FILL — DICLOFENAC SODIUM 1 % GEL: 1 | 13 days supply | Qty: 100 | Fill #0

## 2018-12-28 NOTE — Progress Notes (Signed)
Subjective:   Patient ID: Julie Yang, female   DOB: 48 y.o.   MRN: [id number]   HPI 48 year old female presents the office today for concerns of left foot and ankle pain which is been ongoing for last 4 years however she states that she is going to increase her activity level and she states that she tries to remind her she lifts weights she is gets pain to her foot.  She has pain in the morning when she first gets up.  Does get better with activity.  She states the pain is not excruciating but she still notices discomfort.  She not able to take oral anti-inflammatories but she is now topical and she cannot do steroids or steroid injections.  She denies any recent injury to her foot.   Review of Systems  All other systems reviewed and are negative.  Past Medical History:  Diagnosis Date  . Abnormal Pap smear of cervix    1990s  . Anxiety   . Autoimmune disorder ([place])   . Genital warts   . Thyroid disease   . Urticaria     Past Surgical History:  Procedure Laterality Date  . CESAREAN SECTION    . CYST EXCISION     from chest, benign     Current Outpatient Medications:  .  Cholecalciferol (VITAMIN D) 2000 units CAPS, Take 1 capsule by mouth daily., Disp: , Rfl:  .  Multiple Vitamins-Calcium (ONE-A-DAY WOMENS FORMULA) TABS, , Disp: , Rfl:  .  Probiotic Product (PROBIOTIC ADVANCED PO), Take 1 capsule by mouth daily., Disp: , Rfl:  .  SYNTHROID 75 MCG tablet, Take 1 tablet (75 mcg total) by mouth daily before breakfast., Disp: 90 tablet, Rfl: 3 .  triamcinolone cream (KENALOG) 0.1 %, Apply 1 application topically 2 (two) times daily., Disp: 30 g, Rfl: 0 .  diclofenac sodium (VOLTAREN) 1 % GEL, Apply 2 g topically 4 (four) times daily. Rub into affected area of foot 2 to 4 times daily, Disp: 100 g, Rfl: 2  Allergies  Allergen Reactions  . Prednisone Hives  . Aspirin Hives    Exacerbates hives  . Ibuprofen Hives    Exacerbates hives  . Iodine Hives        Objective:   Physical Exam  General: AAO x3, NAD  Dermatological: Skin is warm, dry and supple bilateral. Nails x 10 are well manicured; remaining integument appears unremarkable at this time. There are no open sores, no preulcerative lesions, no rash or signs of infection present.  Vascular: Dorsalis Pedis artery and Posterior Tibial artery pedal pulses are 2/4 bilateral with immedate capillary fill time. Pedal hair growth present. No varicosities and no lower extremity edema present bilateral. There is no pain with calf compression, swelling, warmth, erythema.   Neruologic: Grossly intact via light touch bilateral. Protective threshold with Semmes Wienstein monofilament intact to all pedal sites bilateral.  Negative Tinel sign.  Musculoskeletal: There is tenderness palpation on the plantar medial tubercle of the calcaneus at the insertion of plantar fashion the left side.  Plantar fascia appears to be intact.  Mild discomfort in the distal portion of the posterior tibial tendon along insertion of the navicular tuberosity.  No significant discomfort on the Achilles tendon.  Thompson test is negative.  No areas of tenderness elicited at this time.  Minimal decrease in medial arch upon weightbearing.  Gait: Unassisted, Nonantalgic.   Assessment:   48 year old female left plantar fasciitis, ankle tendinitis     Plan:  -Treatment options  discussed including all alternatives, risks, and complications -Etiology of symptoms were discussed -X-rays were obtained and reviewed with the patient. No evidence of acute fracture or stress fracture. -Prescribed Voltaren gel to use topically as needed.  She states that she has been topical with any side effects previously but will need to hold off on oral anti-inflammatories as well as steroid and steroid injections. -Plantar fascial brace dispensed -Discussed stretching, icing daily -Discussed shoe modifications and orthotics  RTC 4 weeks or sooner if  needed  Trula Slade DPM

## 2018-12-28 NOTE — Patient Instructions (Signed)

## 2018-12-29 ENCOUNTER — Other Ambulatory Visit: Payer: Self-pay | Admitting: Podiatry

## 2018-12-29 DIAGNOSIS — M722 Plantar fascial fibromatosis: Secondary | ICD-10-CM

## 2018-12-29 DIAGNOSIS — M779 Enthesopathy, unspecified: Secondary | ICD-10-CM

## 2018-12-30 DIAGNOSIS — E611 Iron deficiency: Secondary | ICD-10-CM | POA: Diagnosis not present

## 2018-12-30 DIAGNOSIS — Z6832 Body mass index (BMI) 32.0-32.9, adult: Secondary | ICD-10-CM | POA: Diagnosis not present

## 2018-12-30 DIAGNOSIS — R7989 Other specified abnormal findings of blood chemistry: Secondary | ICD-10-CM | POA: Diagnosis not present

## 2018-12-30 DIAGNOSIS — E786 Lipoprotein deficiency: Secondary | ICD-10-CM | POA: Diagnosis not present

## 2018-12-30 DIAGNOSIS — R635 Abnormal weight gain: Secondary | ICD-10-CM | POA: Diagnosis not present

## 2019-01-05 MED FILL — SYNTHROID 75 MCG TABLET: 75 | 90 days supply | Qty: 90 | Fill #0

## 2019-01-07 DIAGNOSIS — R7989 Other specified abnormal findings of blood chemistry: Secondary | ICD-10-CM | POA: Diagnosis not present

## 2019-01-07 DIAGNOSIS — E611 Iron deficiency: Secondary | ICD-10-CM | POA: Diagnosis not present

## 2019-01-07 DIAGNOSIS — Z6831 Body mass index (BMI) 31.0-31.9, adult: Secondary | ICD-10-CM | POA: Diagnosis not present

## 2019-01-14 DIAGNOSIS — R7989 Other specified abnormal findings of blood chemistry: Secondary | ICD-10-CM | POA: Diagnosis not present

## 2019-01-14 DIAGNOSIS — Z6831 Body mass index (BMI) 31.0-31.9, adult: Secondary | ICD-10-CM | POA: Diagnosis not present

## 2019-02-04 ENCOUNTER — Telehealth: Payer: Self-pay | Admitting: Certified Nurse Midwife

## 2019-02-04 NOTE — Telephone Encounter (Signed)
Spoke with patient. Patient reports small bump on right breast that has been coming and going since 12/2018. Some drainage when squeezing. Area is red. Denies warmth, pain, fever/chills. Last MMG 07/16/18.   [id number] prescreen negative, precautions reviewed. OV scheduled for 10/9 at 8:30am with Melvia Heaps, CNM.   Routing to provider for final review. Patient is agreeable to disposition. Will close encounter.

## 2019-02-04 NOTE — Telephone Encounter (Signed)
Patient left voicemail during lunch stating that she has a spot on her breast that she is concerned about.

## 2019-02-05 ENCOUNTER — Encounter: Payer: Self-pay | Admitting: Certified Nurse Midwife

## 2019-02-05 ENCOUNTER — Ambulatory Visit (INDEPENDENT_AMBULATORY_CARE_PROVIDER_SITE_OTHER): Payer: 59 | Admitting: Certified Nurse Midwife

## 2019-02-05 ENCOUNTER — Other Ambulatory Visit: Payer: Self-pay

## 2019-02-05 VITALS — BP 112/70 | HR 68 | Temp 97.1°F | Resp 16 | Wt 178.0 lb

## 2019-02-05 DIAGNOSIS — N6081 Other benign mammary dysplasias of right breast: Secondary | ICD-10-CM

## 2019-02-05 NOTE — Patient Instructions (Signed)
Epidermal Cyst  An epidermal cyst is a small, painless lump under your skin. The cyst contains a grayish-white, bad-smelling substance (keratin). Do not try to pop or open an epidermal cyst yourself. What are the causes?  A blocked hair follicle.  A hair that curls and re-enters the skin instead of growing straight out of the skin.  A blocked pore.  Irritated skin.  An injury to the skin.  Certain conditions that are passed along from parent to child (inherited).  Human papillomavirus (HPV).  Long-term sun damage to the skin. What increases the risk?  Having acne.  Being overweight.  Being 30-40 years old. What are the signs or symptoms? These cysts are usually harmless, but they can get infected. Symptoms of infection may include:  Redness.  Inflammation.  Tenderness.  Warmth.  Fever.  A grayish-white, bad-smelling substance drains from the cyst.  Pus drains from the cyst. How is this treated? In many cases, epidermal cysts go away on their own without treatment. If a cyst becomes infected, treatment may include:  Opening and draining the cyst, done by a doctor. After draining, you may need minor surgery to remove the rest of the cyst.  Antibiotic medicine.  Shots of medicines (steroids) that help to reduce inflammation.  Surgery to remove the cyst. Surgery may be done if the cyst: ? Becomes large. ? Bothers you. ? Has a chance of turning into cancer.  Do not try to open a cyst yourself. Follow these instructions at home:  Take over-the-counter and prescription medicines only as told by your doctor.  If you were prescribed an antibiotic medicine, take it it as told by your doctor. Do not stop using the antibiotic even if you start to feel better.  Keep the area around your cyst clean and dry.  Wear loose, dry clothing.  Avoid touching your cyst.  Check your cyst every day for signs of infection. Check for: ? Redness, swelling, or pain. ? Fluid  or blood. ? Warmth. ? Pus or a bad smell.  Keep all follow-up visits as told by your doctor. This is important. How is this prevented?  Wear clean, dry, clothing.  Avoid wearing tight clothing.  Keep your skin clean and dry. Take showers or baths every day. Contact a doctor if:  Your cyst has symptoms of infection.  Your condition does not improve or gets worse.  You have a cyst that looks different from other cysts you have had.  You have a fever. Get help right away if:  Redness spreads from the cyst into the area close by. Summary  An epidermal cyst is a sac made of skin tissue.  If a cyst becomes infected, treatment may include surgery to open and drain the cyst, or to remove it.  Take over-the-counter and prescription medicines only as told by your doctor.  Contact a doctor if your condition is not improving or is getting worse.  Keep all follow-up visits as told by your doctor. This is important. This information is not intended to replace advice given to you by your health care provider. Make sure you discuss any questions you have with your health care provider. Document Released: 05/23/2004 Document Revised: 08/06/2018 Document Reviewed: 01/22/2018 Elsevier Patient Education  2020 Elsevier Inc.  

## 2019-02-05 NOTE — Progress Notes (Signed)
   Subjective:   48 y.o. MarriedCaucasian female presents for evaluation of right breast mass. Onset of the symptoms was early September 2020. Patient sought evaluation because of pimple occurred after pulling hair out of area in areola, developed pimple and she squeezed and had white discharge, no odor, no pus appearance. No other area redness or tenderness. Not really tender, just pimple with exudate. No other masses or redness. No tenderness in axilla area ever. .  Contributing factors include family hx on mother's side, MGM at age 24, had mastectomy. Lived until 56.. Denies no other symptoms.. Patient denies history of trauma, bites, or injuries. Last mammogram was 07/16/2018, which was normal. Patient feels she has irritated it due to squeezing when present only. No bra change. No other concerns today. Review of Systems Pertinent items noted in HPI and remainder of comprehensive ROS otherwise negative.   Objective:   General appearance: alert, cooperative, appears stated age and no distress Breasts: normal appearance, no masses or tenderness, No nipple retraction or dimpling, No nipple discharge or bleeding, No axillary or supraclavicular adenopathy, healed area next to areola at 11-12 o'clock, right breast slight pink noted  in area of concern, no exudate or pustule noted. Area of fibroadenoma noted on last mammogram unchanged at 10 o'clock 5-6 FB from aerola palpated very small, non tender, mobile.  Physical Exam Chest:       Assessment:   ASSESSMENT:Patient is diagnosed healed sebaceous cyst vs ingrown hair. No infection or inflammation noted. Known fibroadenoma per mammogram follow up.    Plan:   PLAN: Discussed finding with patient and no active area of concern noted at present and suspect sebaceous cyst. Possible ingrown hair. Avoid squeezing area if occurs again. Warm soak to area. Instructed to obtain OTC bacitracin and apply to area tid x 3 days now and see if area resolves.  Patient will advise of status after use of if reoccurs. Warning signs with infection given and need to come in. Patient agreeable. Continue SBE monthly.   Rv prn

## 2019-02-11 DIAGNOSIS — E786 Lipoprotein deficiency: Secondary | ICD-10-CM | POA: Diagnosis not present

## 2019-02-11 DIAGNOSIS — Z6830 Body mass index (BMI) 30.0-30.9, adult: Secondary | ICD-10-CM | POA: Diagnosis not present

## 2019-02-25 DIAGNOSIS — Z6830 Body mass index (BMI) 30.0-30.9, adult: Secondary | ICD-10-CM | POA: Diagnosis not present

## 2019-02-25 DIAGNOSIS — E611 Iron deficiency: Secondary | ICD-10-CM | POA: Diagnosis not present

## 2019-03-18 DIAGNOSIS — R7989 Other specified abnormal findings of blood chemistry: Secondary | ICD-10-CM | POA: Diagnosis not present

## 2019-03-18 DIAGNOSIS — Z6829 Body mass index (BMI) 29.0-29.9, adult: Secondary | ICD-10-CM | POA: Diagnosis not present

## 2019-04-01 DIAGNOSIS — E611 Iron deficiency: Secondary | ICD-10-CM | POA: Diagnosis not present

## 2019-04-01 DIAGNOSIS — Z6829 Body mass index (BMI) 29.0-29.9, adult: Secondary | ICD-10-CM | POA: Diagnosis not present

## 2019-04-13 MED FILL — SYNTHROID 75 MCG TABLET: 75 | 90 days supply | Qty: 90 | Fill #1

## 2019-04-15 DIAGNOSIS — R7989 Other specified abnormal findings of blood chemistry: Secondary | ICD-10-CM | POA: Diagnosis not present

## 2019-04-15 DIAGNOSIS — Z6829 Body mass index (BMI) 29.0-29.9, adult: Secondary | ICD-10-CM | POA: Diagnosis not present

## 2019-05-06 DIAGNOSIS — N951 Menopausal and female climacteric states: Secondary | ICD-10-CM | POA: Diagnosis not present

## 2019-05-06 DIAGNOSIS — Z6829 Body mass index (BMI) 29.0-29.9, adult: Secondary | ICD-10-CM | POA: Diagnosis not present

## 2019-05-06 DIAGNOSIS — E786 Lipoprotein deficiency: Secondary | ICD-10-CM | POA: Diagnosis not present

## 2019-05-06 DIAGNOSIS — R7989 Other specified abnormal findings of blood chemistry: Secondary | ICD-10-CM | POA: Diagnosis not present

## 2019-05-06 DIAGNOSIS — E611 Iron deficiency: Secondary | ICD-10-CM | POA: Diagnosis not present

## 2019-05-12 ENCOUNTER — Telehealth: Payer: Self-pay | Admitting: Certified Nurse Midwife

## 2019-05-12 NOTE — Telephone Encounter (Signed)
Patient would to follow up with  Julie Yang for a breat problem.

## 2019-05-12 NOTE — Telephone Encounter (Signed)
Left message to call Angeles, RN at [facility] [phone].   

## 2019-05-13 NOTE — Telephone Encounter (Signed)
Spoke with patient. Patient request OV for recheck of right breast. Was seen in office on 02/05/19 for right breast sebaceous cyst. Reports the spot on the right breast no longer fluid filled "like a pimple", now just  "raised white spot". Feels hard and look different. Denies fever/chills, redness or swelling. [id number] precautions reviewed, prescreen negative. OV scheduled for 05/17/19 at 10am with Melvia Heaps, CNM.   Routing to provider for final review. Patient is agreeable to disposition. Will close encounter.

## 2019-05-17 ENCOUNTER — Ambulatory Visit: Payer: 59 | Admitting: Certified Nurse Midwife

## 2019-05-17 ENCOUNTER — Other Ambulatory Visit: Payer: Self-pay

## 2019-05-17 ENCOUNTER — Telehealth: Payer: Self-pay | Admitting: *Deleted

## 2019-05-17 ENCOUNTER — Encounter: Payer: Self-pay | Admitting: Certified Nurse Midwife

## 2019-05-17 VITALS — BP 100/62 | HR 68 | Temp 98.1°F | Resp 72 | Wt 164.0 lb

## 2019-05-17 DIAGNOSIS — N6081 Other benign mammary dysplasias of right breast: Secondary | ICD-10-CM | POA: Diagnosis not present

## 2019-05-17 DIAGNOSIS — N6310 Unspecified lump in the right breast, unspecified quadrant: Secondary | ICD-10-CM | POA: Diagnosis not present

## 2019-05-17 NOTE — Telephone Encounter (Signed)
-----   Message from Regina Eck, CNM sent at 05/17/2019 11:07 AM EST ----- Patient needs right breast US and diagnostic mammogram. History of fibroadenoma in breast known. Has sebaceous appearing area see previously, no redness now, but feels like BB now at 11 o'clock at aerola edge. Non tender, no discharge. She would prefer Thursday or Friday morning if possible.Aware she will be called with appointment.

## 2019-05-17 NOTE — Telephone Encounter (Signed)
TBC closed on 05/17/19 due to holiday.   Order placed for right breast Dx MMG and [place], if needed.   Patient placed in MMG hold.

## 2019-05-17 NOTE — Patient Instructions (Signed)

## 2019-05-17 NOTE — Progress Notes (Signed)
   Subjective:   49 y.o. Married Caucasian female presents for re- evaluation of right breast ? sebaceous cyst. Onset of the symptoms was 02/05/2019 at which time the area was elevated and had increased pink in color. Patient has sebaceous cyst before, but resolve and not in the area. Denies tenderness or exudate or drainage from area. No tenderness in axilla or lymph nodes. Patient sought evaluation because "it has not resolved".  Contributing factors include MGM history of breast cancer at age 89, lived until 14.. Denies chills, fatigue, fevers and malaise. Patient denies history of trauma, bites, or injuries to area since last seen. Last mammogram was 07/16/2018 which continued to show fibroadenoma that was stable at 10 o'clock.Previous evaluation has included mammogram (07/16/2018) and ultrasound (07/16/2018). Patient feels this has not changed.   Review of Systems Pertinent items noted in HPI and remainder of comprehensive ROS otherwise negative.   Objective:   General appearance: alert, cooperative, appears stated age and no distress Breasts: normal appearance, no masses or tenderness, No nipple retraction or dimpling, No nipple discharge or bleeding, No axillary or supraclavicular adenopathy, left breast.  Right breast no tenderness, skin change, redness or additional mass other than known fibroadenoma at 10 o'clock. Area of concern is above areola at 11 o'clock BB feel ,similar to montgomery tubercle. No drainage or redness or tenderness noted., no axillary or supraclavicular adenopathy in right area.  Physical Exam Chest:      Assessment:   ASSESSMENT:Patient is diagnosed with right breast resolving sebaceous cyst vs BB size mass or Montgomery tubercle at 11 o'clock. Known fibroadenoma in right breast 10 o'clock. See previous screening.   Plan:   PLAN: Discussed finding with patient and feel diagnostic mammogram of right breast indicated with [place].Marland Kitchen Patient is aware she will be called  with appointment and instructions. Verbalized understanding. Rv prn

## 2019-05-18 NOTE — Telephone Encounter (Signed)
Call to patient, no answer, mailbox full.   MyChart message to patient with appt details.    Spoke with Anderson Malta at [facility]. Patient scheduled for right breast Dx MMG and [place], if needed, on 2/4 arriving at 8:40am, appt at [facility] to Melvia Heaps, CNM FYI.   Encounter closed.

## 2019-05-20 DIAGNOSIS — E786 Lipoprotein deficiency: Secondary | ICD-10-CM | POA: Diagnosis not present

## 2019-05-20 DIAGNOSIS — Z6828 Body mass index (BMI) 28.0-28.9, adult: Secondary | ICD-10-CM | POA: Diagnosis not present

## 2019-05-20 DIAGNOSIS — E611 Iron deficiency: Secondary | ICD-10-CM | POA: Diagnosis not present

## 2019-06-03 ENCOUNTER — Other Ambulatory Visit: Payer: Self-pay

## 2019-06-03 ENCOUNTER — Ambulatory Visit
Admission: RE | Admit: 2019-06-03 | Discharge: 2019-06-03 | Disposition: A | Discharge status: Home / Self Care | Payer: 59 | Source: Ambulatory Visit | Attending: Certified Nurse Midwife | Admitting: Certified Nurse Midwife

## 2019-06-03 DIAGNOSIS — N6310 Unspecified lump in the right breast, unspecified quadrant: Secondary | ICD-10-CM

## 2019-06-03 DIAGNOSIS — N6081 Other benign mammary dysplasias of right breast: Secondary | ICD-10-CM

## 2019-06-03 DIAGNOSIS — R922 Inconclusive mammogram: Secondary | ICD-10-CM | POA: Diagnosis not present

## 2019-06-03 DIAGNOSIS — N6489 Other specified disorders of breast: Secondary | ICD-10-CM | POA: Diagnosis not present

## 2019-06-04 DIAGNOSIS — Z6828 Body mass index (BMI) 28.0-28.9, adult: Secondary | ICD-10-CM | POA: Diagnosis not present

## 2019-06-04 DIAGNOSIS — R7989 Other specified abnormal findings of blood chemistry: Secondary | ICD-10-CM | POA: Diagnosis not present

## 2019-07-02 ENCOUNTER — Other Ambulatory Visit: Payer: Self-pay

## 2019-07-02 ENCOUNTER — Encounter: Payer: Self-pay | Admitting: Physician Assistant

## 2019-07-02 ENCOUNTER — Ambulatory Visit (INDEPENDENT_AMBULATORY_CARE_PROVIDER_SITE_OTHER): Payer: 59 | Admitting: Physician Assistant

## 2019-07-02 VITALS — BP 122/78 | HR 70 | Temp 98.3°F | Ht 63.0 in | Wt 161.2 lb

## 2019-07-02 DIAGNOSIS — M25551 Pain in right hip: Secondary | ICD-10-CM | POA: Diagnosis not present

## 2019-07-02 NOTE — Progress Notes (Signed)
Julie Yang is a 49 y.o. female here to Establish care.  I acted as a Education administrator for [employer], PA-C Anselmo Pickler, LPN  History of Present Illness:   Chief Complaint  Patient presents with  . Establish Care  . Hip Pain   Hip pain Pt c/o right hip pain off and on x 1 month with movement. Hurts when she crosses her right leg over left leg and when she swings her leg out she gets a sharp pain. Denies numbness/tingling of legs. She has been very active recently with trying to lose weight. Has pain when going from sitting in low position to standing.  Cannot tolerate anti-inflammatories -- has been told that they may induce her igE mediated urticaria.  Health Maintenance: Immunizations -- UTD Colonoscopy -- done in Dec 23, 2017 at [facility], normal per pt -- due in 5 years Mammogram -- due, will schedule in April PAP -- UTD, due 10/2019 has a GYN Bone Density -- N/A Weight -- Weight: 161 lb 4 oz (73.1 kg)   Depression screen [facility] 2/9 07/02/2019  Decreased Interest 0  Down, Depressed, Hopeless 0  PHQ - 2 Score 0    GAD 7 : Generalized Anxiety Score 07/02/2019  Nervous, Anxious, on Edge 0  Control/stop worrying 0  Worry too much - different things 0  Trouble relaxing 1  Restless 0  Easily annoyed or irritable 0  Afraid - awful might happen 0  Total GAD 7 Score 1  Anxiety Difficulty Not difficult at all     Other providers/specialists: Patient Care Team: Inda Coke, [place] as PCP - General (Physician Assistant) Regina Eck, CNM as Referring Physician (Certified Nurse Midwife)   Past Medical History:  Diagnosis Date  . Abnormal Pap smear of cervix    1990s  . Anxiety   . Genital warts   . Thyroid disease 2013  . Urticaria    chronic autoimmune urticaria     Social History   Socioeconomic History  . Marital status: Married    Spouse name: Not on file  . Number of children: Not on file  . Years of education: Not on file  . Highest education level: Not  on file  Occupational History  . Not on file  Tobacco Use  . Smoking status: Never Smoker  . Smokeless tobacco: Never Used  Substance and Sexual Activity  . Alcohol use: Yes    Comment: 1 a month  . Drug use: No  . Sexual activity: Yes    Partners: Male    Birth control/protection: Other-see comments    Comment: spouse had vasectomy  Other Topics Concern  . 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 Difficulty of Paying Living Expenses: Not on file  Food Insecurity:   . Worried About Charity fundraiser in the Last Year: Not on file  . Ran Out of Food in the Last Year: Not on file  Transportation Needs:   . Lack of Transportation (Medical): Not on file  . Lack of Transportation (Non-Medical): Not on file  Physical Activity:   . Days of Exercise per Week: Not on file  . Minutes of Exercise per Session: Not on file  Stress:   . Feeling of Stress : Not on file  Social Connections:   . Frequency  of Communication with Friends and Family: Not on file  . Frequency of Social Gatherings with Friends and Family: Not on file  . Attends Religious Services: Not on file  . Active Member of Clubs or Organizations: Not on file  . Attends Archivist Meetings: Not on file  . Marital Status: Not on file  Intimate Partner Violence:   . Fear of Current or Ex-Partner: Not on file  . Emotionally Abused: Not on file  . Physically Abused: Not on file  . Sexually Abused: Not on file    Past Surgical History:  Procedure Laterality Date  . CESAREAN SECTION    . CYST EXCISION     from chest, benign    Family History  Problem Relation Age of Onset  . Heart disease Father   . Hypertension Father   . Hyperlipidemia Father   . Eczema Father   . Heart disease Maternal Grandmother   . Diabetes Maternal Grandmother   . Breast cancer  Maternal Grandmother 80  . Hypertension Maternal Grandmother   . Stroke Maternal Grandmother   . Heart disease Maternal Grandfather   . Lung cancer Maternal Grandfather   . Colon cancer Maternal Grandfather 70  . Brain cancer Maternal Grandfather   . Hypertension Maternal Grandfather   . Heart attack Maternal Grandfather   . Heart disease Paternal Grandmother   . Hypertension Paternal Grandmother   . Heart disease Paternal Grandfather   . Hypertension Paternal Grandfather   . Heart attack Paternal Grandfather   . Crohn's disease Brother   . Colon cancer Maternal Uncle 68  . Allergic rhinitis Neg Hx   . Angioedema Neg Hx   . Asthma Neg Hx   . Immunodeficiency Neg Hx   . Urticaria Neg Hx     Allergies  Allergen Reactions  . Prednisone Hives  . Aspirin Hives    Exacerbates hives  . Ibuprofen Hives    Exacerbates hives  . Iodine Hives     Current Medications:   Current Outpatient Medications:  .  Cholecalciferol (VITAMIN D) 2000 units CAPS, Take 1 capsule by mouth daily., Disp: , Rfl:  .  diclofenac sodium (VOLTAREN) 1 % GEL, Apply 2 g topically 4 (four) times daily. Rub into affected area of foot 2 to 4 times daily (Patient taking differently: Apply 2 g topically as needed. Rub into affected area of foot 2 to 4 times daily), Disp: 100 g, Rfl: 2 .  Multiple Vitamins-Calcium (ONE-A-DAY WOMENS FORMULA) TABS, , Disp: , Rfl:  .  Probiotic Product (PROBIOTIC ADVANCED PO), Take 1 capsule by mouth daily., Disp: , Rfl:  .  SYNTHROID 75 MCG tablet, Take 1 tablet (75 mcg total) by mouth daily before breakfast., Disp: 90 tablet, Rfl: 3   Review of Systems:   ROS  Negative unless otherwise specified per HPI.  Vitals:   Vitals:   07/02/19 1436  BP: 122/78  Pulse: 70  Temp: 98.3 F (36.8 C)  TempSrc: Temporal  SpO2: 96%  Weight: 161 lb 4 oz (73.1 kg)  Height: 5\' 3"  (1.6 m)      Body mass index is 28.56 kg/m.  Physical Exam:   Physical Exam Constitutional:       Appearance: She is well-developed.  HENT:     Head: Normocephalic and atraumatic.  Eyes:     Conjunctiva/sclera: Conjunctivae normal.  Pulmonary:     Effort: Pulmonary effort is normal.  Musculoskeletal:        General: Normal range of motion.  Cervical back: Normal range of motion and neck supple.     Comments: No TTP of bilateral hips. Pain to lateral R hip with internal rotation of R leg. No decreased ROM.  Skin:    General: Skin is warm and dry.  Neurological:     Mental Status: She is alert and oriented to person, place, and time.  Psychiatric:        Behavior: Behavior normal.        Thought Content: Thought content normal.        Judgment: Judgment normal.     Assessment and Plan:   Radia was seen today for establish care and hip pain.  Diagnoses and all orders for this visit:  Right hip pain   No red flags on discussion or exam. Suspect IT band issue or tronchanteric bursitis. Recommended stretches, handouts provided. If no improvement, will refer to sports medicine.  . Reviewed expectations re: course of current medical issues. . Discussed self-management of symptoms. . Outlined signs and symptoms indicating need for more acute intervention. . Patient verbalized understanding and all questions were answered. . See orders for this visit as documented in the electronic medical record. . Patient received an After-Visit Summary.  CMA or LPN served as scribe during this visit. History, Physical, and Plan performed by medical provider. The above documentation has been reviewed and is accurate and complete.   Inda Coke, PA-C

## 2019-07-02 NOTE — Patient Instructions (Signed)
It was great to see you!  Try the exercises, if no improvement, let me know and I will get you to sports medicine.  Take care,  Inda Coke PA-C

## 2019-07-07 ENCOUNTER — Other Ambulatory Visit: Payer: Self-pay | Admitting: Certified Nurse Midwife

## 2019-07-07 DIAGNOSIS — Z1231 Encounter for screening mammogram for malignant neoplasm of breast: Secondary | ICD-10-CM

## 2019-07-08 DIAGNOSIS — E611 Iron deficiency: Secondary | ICD-10-CM | POA: Diagnosis not present

## 2019-07-08 DIAGNOSIS — Z6828 Body mass index (BMI) 28.0-28.9, adult: Secondary | ICD-10-CM | POA: Diagnosis not present

## 2019-07-09 ENCOUNTER — Telehealth: Payer: Self-pay | Admitting: Physician Assistant

## 2019-07-09 NOTE — Telephone Encounter (Signed)
Faxed ROI to [facility] 3/12/21fbg

## 2019-07-15 NOTE — Telephone Encounter (Signed)
Recv'd medical records from [facility] forwarded 10 pages to Dr. Inda Coke 3/18/21fbg

## 2019-07-16 ENCOUNTER — Telehealth: Payer: Self-pay

## 2019-07-16 ENCOUNTER — Encounter: Payer: Self-pay | Admitting: Certified Nurse Midwife

## 2019-07-16 NOTE — Telephone Encounter (Signed)
Adalberto Cole, CNM 4 hours ago (10:57 AM)   Hi there -   First of all congrats to Dr Hollice Espy! Very happy for her on her retirement.  I had an appointment with her on August 16 at 10am. Can I switch that to Dr. Quincy Simmonds? Or do I need to reschedule all together?   Pt sent mychart message. Spoke to pt. Pt rescheduled with Dr Quincy Simmonds on 12/13/2019 at 2 pm. Pt agreeable and verbalized understanding.   Routing to D. Hollice Espy, CNM for review and will close encounter.  Cc: Dr Quincy Simmonds.

## 2019-07-19 MED FILL — SYNTHROID 75 MCG TABLET: 75 | 90 days supply | Qty: 90 | Fill #2

## 2019-07-20 ENCOUNTER — Encounter: Payer: Self-pay | Admitting: Physician Assistant

## 2019-07-21 ENCOUNTER — Ambulatory Visit (INDEPENDENT_AMBULATORY_CARE_PROVIDER_SITE_OTHER): Payer: 59 | Admitting: Physician Assistant

## 2019-07-21 ENCOUNTER — Encounter: Payer: Self-pay | Admitting: Physician Assistant

## 2019-07-21 ENCOUNTER — Other Ambulatory Visit: Payer: Self-pay

## 2019-07-21 VITALS — BP 126/80 | HR 74 | Temp 97.8°F | Ht 63.0 in | Wt 161.5 lb

## 2019-07-21 DIAGNOSIS — K644 Residual hemorrhoidal skin tags: Secondary | ICD-10-CM | POA: Diagnosis not present

## 2019-07-21 MED ORDER — ACETAMINOPHEN-CODEINE #3 300-30 MG PO TABS: 1.0000 | ORAL_TABLET | ORAL | 0 refills | Status: DC | PRN

## 2019-07-21 MED FILL — ACETAMINOPHEN/COD #3 TABLET: 300-30 | 5 days supply | Qty: 30 | Fill #0

## 2019-07-21 NOTE — Progress Notes (Signed)
Julie Yang is a 49 y.o. female here for a new problem.  I acted as a Education administrator for [employer], PA-C Anselmo Pickler, LPN   History of Present Illness:   Chief Complaint  Patient presents with  . Hemorrhoids    HPI   Hemorrhoid Pt c/o hemorrhoid, started on Sunday after moving some heavy stones in her yard. She did not feel like she strained herself. Hemorrhoid is on the outside, is hard and started bleeding Monday evening, has a lot of pain. Has not noticed blood in stool and stools are soft, but uncomfortable to go. She is taking Dulcolax to help go to the bathroom. Tylenol for pain.   Past Medical History:  Diagnosis Date  . Abnormal Pap smear of cervix    1990s  . Anxiety   . Genital warts   . Thyroid disease 2013  . Urticaria    chronic autoimmune urticaria     Social History   Socioeconomic History  . Marital status: Married    Spouse name: Not on file  . Number of children: Not on file  . Years of education: Not on file  . Highest education level: Not on file  Occupational History  . Not on file  Tobacco Use  . Smoking status: Never Smoker  . Smokeless tobacco: Never Used  Substance and Sexual Activity  . Alcohol use: Yes    Comment: 1 a month  . Drug use: No  . Sexual activity: Yes    Partners: Male    Birth control/protection: Other-see comments    Comment: spouse had vasectomy  Other Topics Concern  . 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 Difficulty of Paying Living Expenses:   Food Insecurity:   . Worried About Charity fundraiser in the Last Year:   . Arboriculturist in the Last Year:   Transportation Needs:   . Film/video editor (Medical):   Marland Kitchen Lack of Transportation (Non-Medical):   Physical Activity:   . Days of Exercise per Week:   . Minutes of Exercise per Session:    Stress:   . Feeling of Stress :   Social Connections:   . Frequency of Communication with Friends and Family:   . Frequency of Social Gatherings with Friends and Family:   . Attends Religious Services:   . Active Member of Clubs or Organizations:   . Attends Archivist Meetings:   Marland Kitchen Marital Status:   Intimate Partner Violence:   . Fear of Current or Ex-Partner:   . Emotionally Abused:   Marland Kitchen Physically Abused:   . Sexually Abused:     Past Surgical History:  Procedure Laterality Date  . CESAREAN SECTION    . CYST EXCISION     from chest, benign    Family History  Problem Relation Age of Onset  . Heart disease Father   . Hypertension Father   . Hyperlipidemia Father   . Eczema Father   . Heart disease Maternal Grandmother   . Diabetes Maternal Grandmother   . Breast cancer Maternal Grandmother 80  . Hypertension Maternal Grandmother   . Stroke Maternal Grandmother   . Heart disease Maternal Grandfather   . Lung cancer Maternal Grandfather   . Colon cancer Maternal Grandfather 47  . Brain cancer  Maternal Grandfather   . Hypertension Maternal Grandfather   . Heart attack Maternal Grandfather   . Heart disease Paternal Grandmother   . Hypertension Paternal Grandmother   . Heart disease Paternal Grandfather   . Hypertension Paternal Grandfather   . Heart attack Paternal Grandfather   . Crohn's disease Brother   . Colon cancer Maternal Uncle 74  . Allergic rhinitis Neg Hx   . Angioedema Neg Hx   . Asthma Neg Hx   . Immunodeficiency Neg Hx   . Urticaria Neg Hx     Allergies  Allergen Reactions  . Prednisone Hives  . Aspirin Hives    Exacerbates hives  . Ibuprofen Hives    Exacerbates hives  . Iodine Hives    Current Medications:   Current Outpatient Medications:  .  Cholecalciferol (VITAMIN D) 2000 units CAPS, Take 1 capsule by mouth daily., Disp: , Rfl:  .  diclofenac sodium (VOLTAREN) 1 % GEL, Apply 2 g topically 4 (four) times daily. Rub into  affected area of foot 2 to 4 times daily (Patient taking differently: Apply 2 g topically as needed. Rub into affected area of foot 2 to 4 times daily), Disp: 100 g, Rfl: 2 .  docusate sodium (COLACE) 100 MG capsule, Take 100 mg by mouth daily., Disp: , Rfl:  .  Multiple Vitamins-Calcium (ONE-A-DAY WOMENS FORMULA) TABS, , Disp: , Rfl:  .  Probiotic Product (PROBIOTIC ADVANCED PO), Take 1 capsule by mouth daily., Disp: , Rfl:  .  SYNTHROID 75 MCG tablet, Take 1 tablet (75 mcg total) by mouth daily before breakfast., Disp: 90 tablet, Rfl: 3 .  acetaminophen-codeine (TYLENOL #3) 300-30 MG tablet, Take 1-2 tablets by mouth every 4 (four) hours as needed for moderate pain., Disp: 30 tablet, Rfl: 0   Review of Systems:   ROS  Negative unless otherwise specified per HPI.  Vitals:   Vitals:   07/21/19 1407  BP: 126/80  Pulse: 74  Temp: 97.8 F (36.6 C)  TempSrc: Temporal  SpO2: 98%  Weight: 161 lb 8 oz (73.3 kg)  Height: 5\' 3"  (1.6 m)     Body mass index is 28.61 kg/m.  Physical Exam:   Physical Exam Constitutional:      Appearance: She is well-developed.  HENT:     Head: Normocephalic and atraumatic.  Eyes:     Conjunctiva/sclera: Conjunctivae normal.  Pulmonary:     Effort: Pulmonary effort is normal.  Genitourinary:    Comments: 3 external hemorrhoids, one thrombosed Musculoskeletal:        General: Normal range of motion.     Cervical back: Normal range of motion and neck supple.  Skin:    General: Skin is warm and dry.  Neurological:     Mental Status: She is alert and oriented to person, place, and time.  Psychiatric:        Behavior: Behavior normal.        Thought Content: Thought content normal.        Judgment: Judgment normal.    External Hemorrhoid Excision Meds, vitals, and allergies reviewed.  Indication: thrombosed hemorrhoid Pt complaints of: erythema, pain, swelling Location: rectum Size: 1 cm Informed consent obtained.  Pt aware of risks not  limited to but including infection, bleeding, damage to near by organs. Prep: etoh  Anesthesia: 1%lidocaine with epi, good effect  Incision made with #11 blade  Wound explored and clot removed  Tolerated well     Assessment and Plan:   Julie Yang was  seen today for hemorrhoids.  Diagnoses and all orders for this visit:  External hemorrhoid  Other orders -     acetaminophen-codeine (TYLENOL #3) 300-30 MG tablet; Take 1-2 tablets by mouth every 4 (four) hours as needed for moderate pain.   Tolerated procedure well. Reviewed worsening precautions. Encouraged sitz baths, hydration, fiber, and regular use of miralax. Consider referral to GI for further issues.  . Reviewed expectations re: course of current medical issues. . Discussed self-management of symptoms. . Outlined signs and symptoms indicating need for more acute intervention. . Patient verbalized understanding and all questions were answered. . See orders for this visit as documented in the electronic medical record. . Patient received an After-Visit Summary.  CMA or LPN served as scribe during this visit. History, Physical, and Plan performed by medical provider. The above documentation has been reviewed and is accurate and complete.   Inda Coke, PA-C

## 2019-07-21 NOTE — Patient Instructions (Addendum)
Pain medication has been sent in if you need it.  I like Miralax to soften stools, less harsh than dulcolax.    Nonsurgical Procedures for Hemorrhoids, Care After This sheet gives you information about how to care for yourself after your procedure. Your health care provider may also give you more specific instructions. If you have problems or questions, contact your health care provider. What can I expect after the procedure? After the procedure, it is common to have:  Slight rectal bleeding for a few days.  Soreness or a dull ache in the rectal area. Follow these instructions at home: Medicines  Take over-the-counter and prescription medicines only as told by your health care provider.  Use a stool softener or a bulk laxative as told by your health care provider. Activity   Return to your normal activities as told by your health care provider. Ask your health care provider what activities are safe for you.  Do not lift anything that is heavier than 10 lb (4.5 kg), or the limit that you are told, until your health care provider says that it is safe.  Do not sit for long periods of time without moving. Take a walk every day or as told by your health care provider. Managing pain and swelling  Take warm sitz baths for 20 minutes, 3-4 times a day to ease pain and discomfort. You may do this in a bathtub or using a portable sitz bath that fits over the toilet.  If directed, apply ice to the affected area. Using ice packs between sitz baths may be helpful. ? Put ice in a plastic bag. ? Place a towel between your skin and the bag. ? Leave the ice on for 20 minutes, 2-3 times a day. Eating and drinking   Eat foods that have a lot of fiber in them, such as whole grains, beans, nuts, fruits, and vegetables.  Drink enough fluid to keep your urine pale yellow. General instructions  Do not strain to have a bowel movement.  Do not spend a long time sitting on the toilet.  Keep all  follow-up visits as told by your health care provider. This is important. Contact a health care provider if:  Your pain medicine is not helping.  You have a fever.  You become constipated.  You continue to have light rectal bleeding for more than a few days.  You are unable to pass urine (urinary retention). Get help right away if you have:  Very bad rectal pain.  Heavy bleeding from your rectum. Summary  After the procedure, it is common to have slight rectal bleeding and soreness in the area.  Taking warm sitz baths and applying ice may be helpful to relieve the discomfort.  Eat foods that have a lot of fiber in them, such as whole grains, beans, nuts, fruits, and vegetables.  Get help right away if you have excessive pain or heavy bleeding from your rectum. This information is not intended to replace advice given to you by your health care provider. Make sure you discuss any questions you have with your health care provider. Document Revised: 08/07/2018 Document Reviewed: 09/04/2017 Elsevier Patient Education  [place].

## 2019-08-09 ENCOUNTER — Ambulatory Visit
Admission: RE | Admit: 2019-08-09 | Discharge: 2019-08-09 | Disposition: A | Discharge status: Home / Self Care | Payer: 59 | Source: Ambulatory Visit | Attending: Certified Nurse Midwife | Admitting: Certified Nurse Midwife

## 2019-08-09 ENCOUNTER — Other Ambulatory Visit: Payer: Self-pay

## 2019-08-09 DIAGNOSIS — Z1231 Encounter for screening mammogram for malignant neoplasm of breast: Secondary | ICD-10-CM | POA: Diagnosis not present

## 2019-08-19 IMAGING — MG DIGITAL DIAGNOSTIC BILATERAL MAMMOGRAM WITH TOMO AND CAD
8 series · 8 of 24 positions shown · non-contrast
Comparison: [DATE] [DATE], [DATE], [DATE] [DATE], [DATE], [DATE] [DATE], [DATE].

ADDENDUM:
This addendum is created to add information to the recommendation
section, regarding the timing of follow-up.

The recommendation section should read as follows:
RECOMMENDATION: Bilateral diagnostic mammogram and right breast
ultrasound is recommended in one year to complete a 2 year follow-up
of the probable fibroadenoma.
CLINICAL DATA: 46-year-old patient presents for annual examination
of both breasts in 1 year follow-up of probable fibroadenoma in the
10 o'clock position of the right breast. She has no areas of concern
today.
EXAM:
DIGITAL DIAGNOSTIC BILATERAL MAMMOGRAM WITH CAD AND TOMO
ULTRASOUND RIGHT BREAST

[L MLO synth-2D]
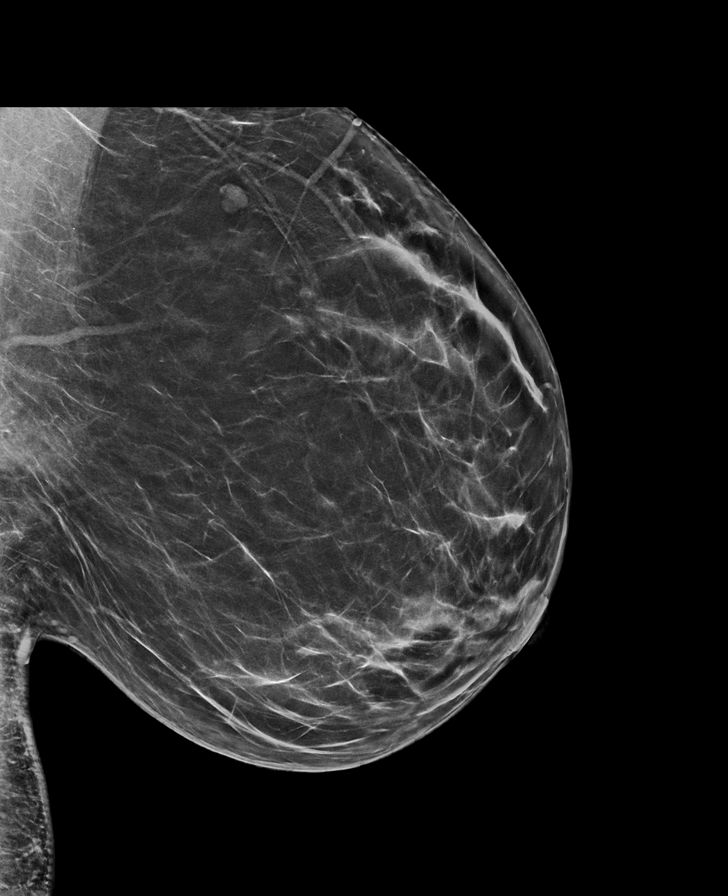

[R CC synth-2D]
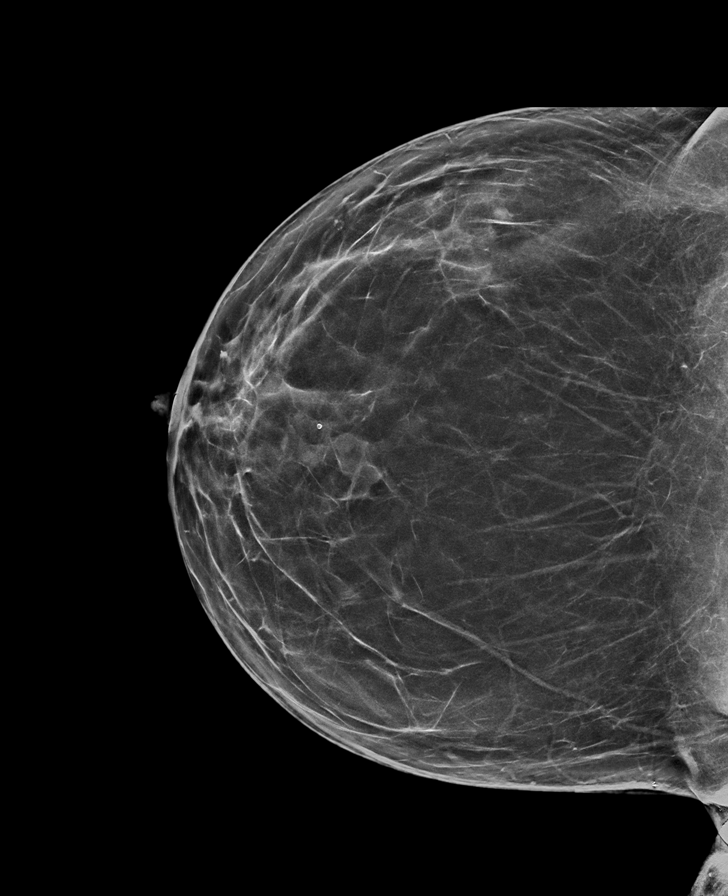

[L CC synth-2D]
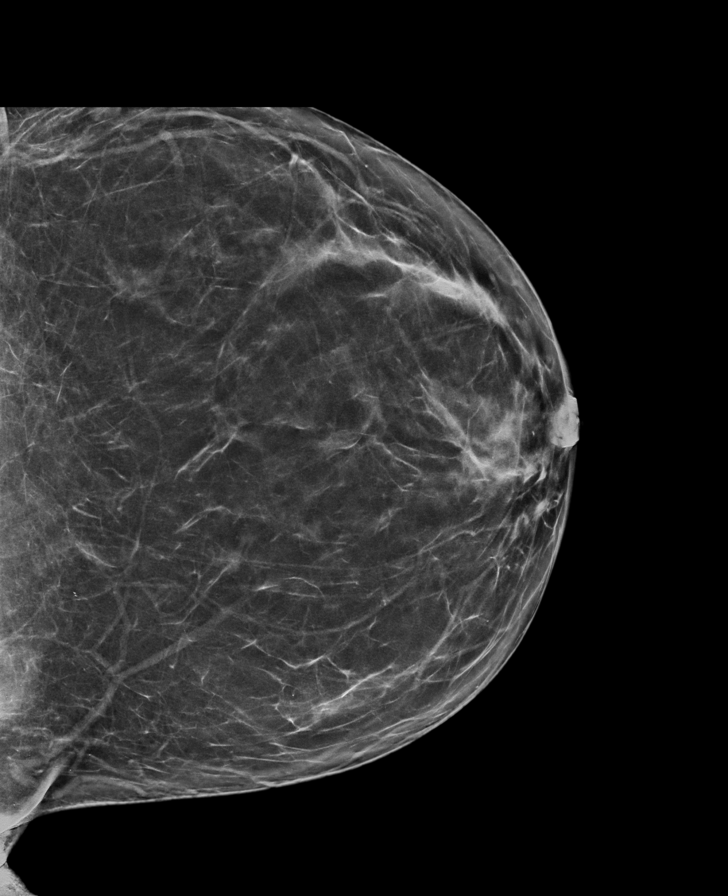

[R MLO synth-2D]
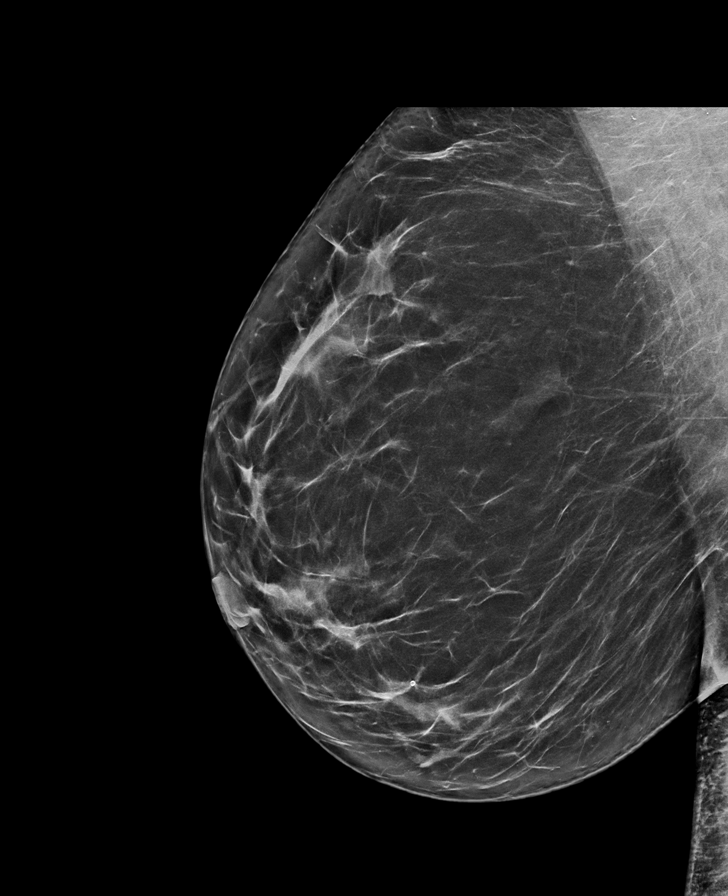

[L MLO tomo · tomo slice 42/83.0]
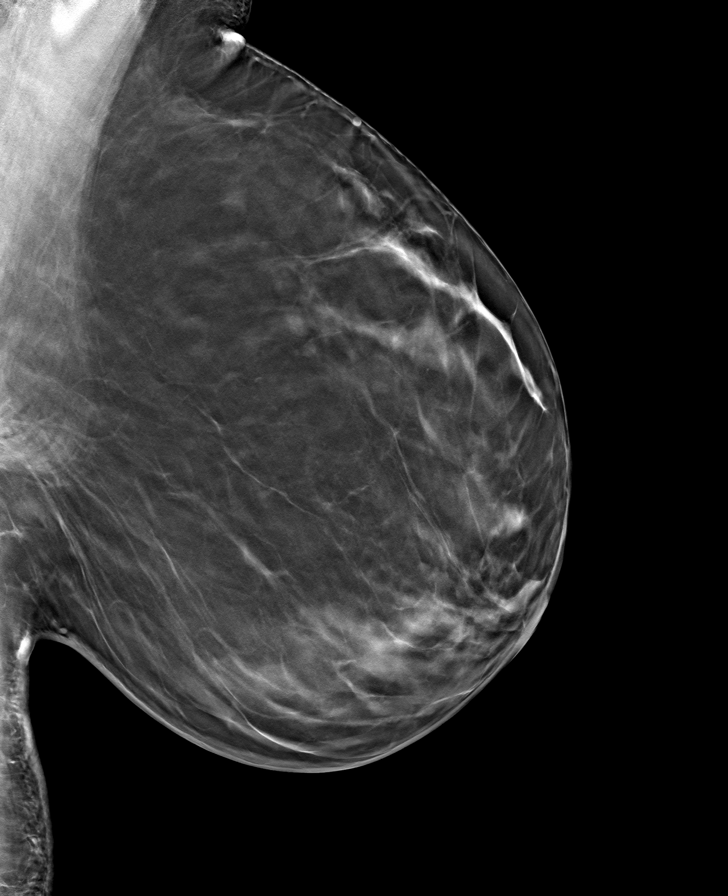

[L CC tomo · tomo slice 37/74.0]
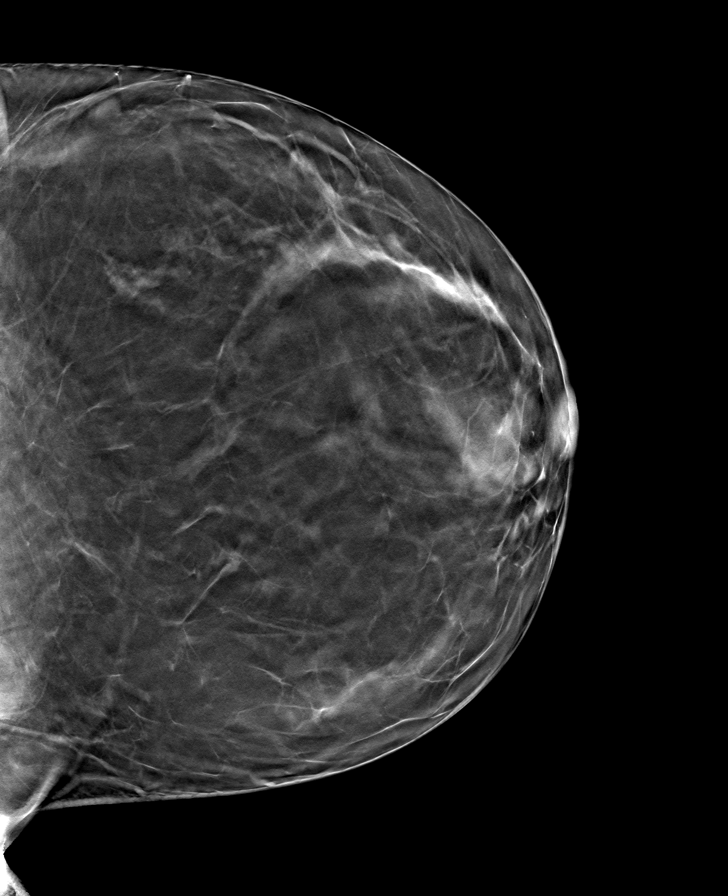

[R MLO tomo · tomo slice 41/82.0]
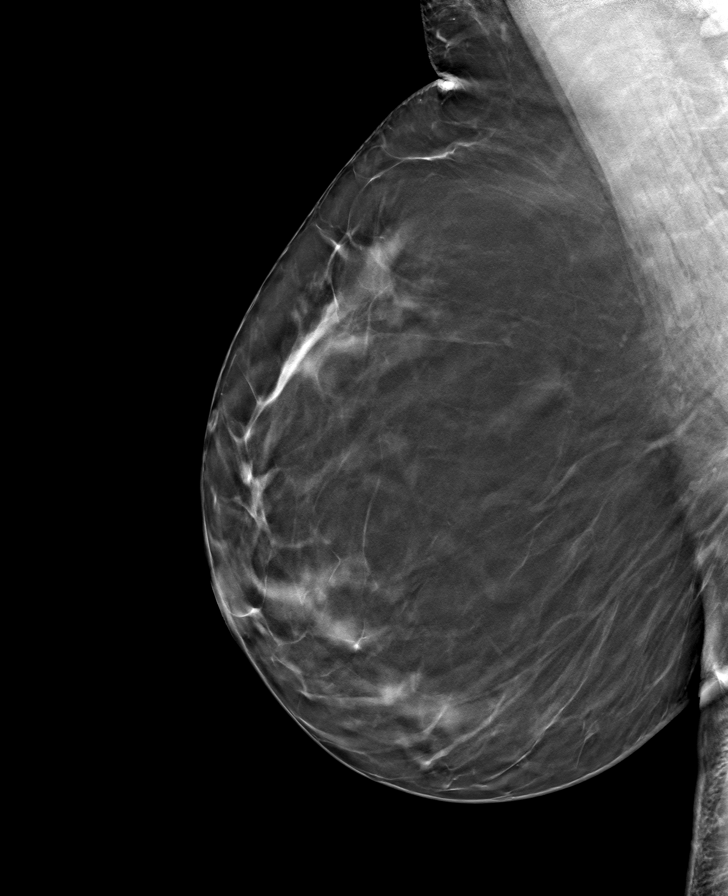

[R CC tomo · tomo slice 39/77.0]
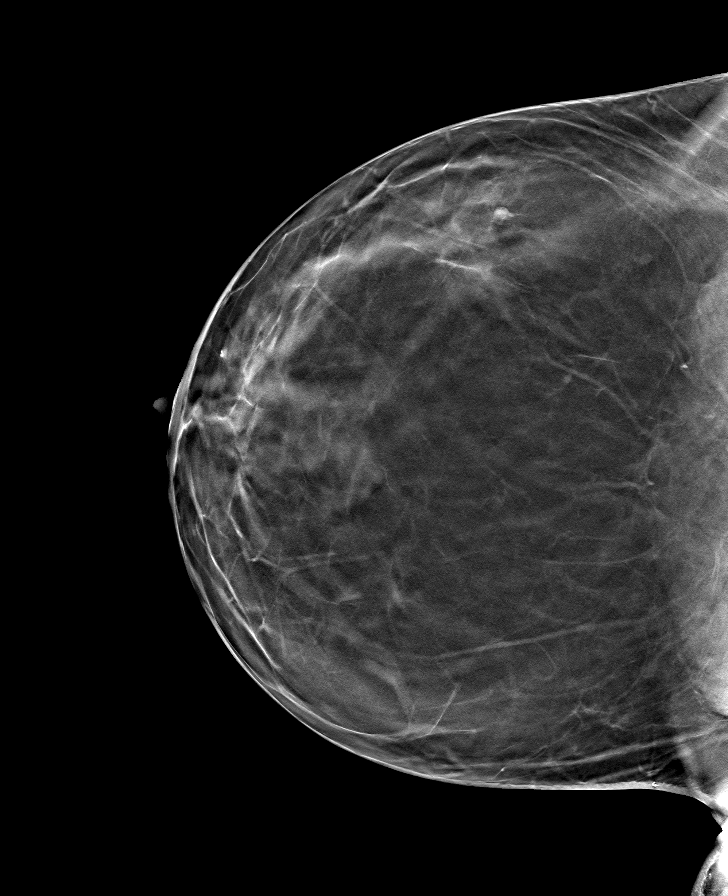

[8 of 24 positions shown; findings below may reference images not displayed]

ACR Breast Density Category b: There are scattered areas of
fibroglandular density.
FINDINGS: No suspicious mass, architectural distortion, or suspicious
microcalcification is identified in either breast to suggest
malignancy.

Mammographic images were processed with CAD.

Targeted ultrasound is performed, showing a circumscribed oval
parallel hypoechoic mass at 10 o'clock position 8 cm from the nipple
measuring 0.8 x 0.4 x 0.9 cm. This is a probable fibroadenoma and
remains stable in size and appearance.
IMPRESSION: Stable probable fibroadenoma in the 10 o'clock position of the right
breast. No new findings in the right breast. No evidence of
malignancy in the left breast.

RECOMMENDATION:
Bilateral diagnostic mammogram and right breast ultrasound is
recommended to complete a 2 year follow-up of the probable
fibroadenoma.

I have discussed the findings and recommendations with the patient.
Results were also provided in writing at the conclusion of the
visit. If applicable, a reminder letter will be sent to the patient
regarding the next appointment.

BI-RADS CATEGORY  3: Probably benign.

## 2019-10-19 MED FILL — SYNTHROID 75 MCG TABLET: 75 | 90 days supply | Qty: 90 | Fill #3

## 2019-10-27 ENCOUNTER — Telehealth: Payer: Self-pay | Admitting: Physician Assistant

## 2019-10-27 NOTE — Telephone Encounter (Signed)
Please call pt and have her schedule to see Dr. Rogers Blocker or Dr. Jonni Sanger tomorrow per Aldona Bar.

## 2019-10-27 NOTE — Telephone Encounter (Signed)
Patient has been scheduled for Thursday with Dr. Rogers Blocker

## 2019-10-27 NOTE — Telephone Encounter (Signed)
Noted  

## 2019-10-27 NOTE — Telephone Encounter (Signed)
Nurse Assessment Nurse: Raphael Gibney, RN, Vera Date/Time Eilene Ghazi Time): 10/26/2019 12:16:00 PM Confirm and document reason for call. If symptomatic, describe symptoms. ---Caller states she thinks she has A fib which started last Thursday. her lips are tingling all the time. her heart races for a few seconds. Racing is occurring 5-6 times a day. happened a few min ago. Has the patient had close contact with a person known or suspected to have the novel coronavirus illness OR traveled / lives in area with major community spread (including international travel) in the last 14 days from the onset of symptoms? * If Asymptomatic, screen for exposure and travel within the last 14 days. ---No Does the patient have any new or worsening symptoms? ---Yes Will a triage be completed? ---Yes Related visit to physician within the last 2 weeks? ---No Does the PT have any chronic conditions? (i.e. diabetes, asthma, this includes High risk factors for pregnancy, etc.) ---Yes List chronic conditions. ---autoimmune disorder Is the patient pregnant or possibly pregnant? (Ask all females between the ages of 47-55) ---No Is this a behavioral health or substance abuse call? ---NoPLEASE NOTE: All timestamps contained within this report are represented as [place] Standard Time. CONFIDENTIALTY NOTICE: This fax transmission is intended only for the addressee. It contains information that is legally privileged, confidential or otherwise protected from use or disclosure. If you are not the intended recipient, you are strictly prohibited from reviewing, disclosing, copying using or disseminating any of this information or taking any action in reliance on or regarding this information. If you have received this fax in error, please notify us immediately by telephone so that we can arrange for its return to [place]. Phone: [phone], Toll-Free: [phone], Fax: [phone] Page: 2 of 2 Call Id:  [id number] Guidelines Guideline Title Affirmed Question Affirmed Notes Nurse Date/Time Eilene Ghazi Time) Heart Rate and Heartbeat Questions [1] Heart beating very rapidly (e.g., > 140 / minute) AND [2] not present now (Exception: during exercise) Raphael Gibney, RN, Vanita Ingles 10/26/2019 12:19:13 PM Disp. Time Eilene Ghazi Time) Disposition Final User 10/26/2019 12:15:10 PM Send to Urgent Ashok Pall 10/26/2019 12:24:14 PM See HCP within 4 Hours (or PCP triage) Yes Raphael Gibney, RN, Doreatha Lew Disagree/Comply Comply Caller Understands Yes PreDisposition Call Doctor Care Advice Given Per Guideline SEE HCP WITHIN 4 HOURS (OR PCP TRIAGE): * IF OFFICE WILL BE OPEN: You need to be seen within the next 3 or 4 hours. Call your doctor (or NP/PA) now or as soon as the office opens. CALL BACK IF: * You become worse. Comments User: Dannielle Burn, RN Date/Time Eilene Ghazi Time): 10/26/2019 12:24:47 PM no appts available for today. pt states she just wants to make appt for tomorrow. Referrals GO TO FACILITY REFUSED

## 2019-10-28 ENCOUNTER — Encounter: Payer: Self-pay | Admitting: Family Medicine

## 2019-10-28 ENCOUNTER — Other Ambulatory Visit: Payer: Self-pay

## 2019-10-28 ENCOUNTER — Ambulatory Visit: Payer: 59 | Admitting: Family Medicine

## 2019-10-28 VITALS — BP 118/78 | HR 77 | Temp 97.0°F | Ht 63.0 in | Wt 168.0 lb

## 2019-10-28 DIAGNOSIS — R002 Palpitations: Secondary | ICD-10-CM

## 2019-10-28 DIAGNOSIS — R000 Tachycardia, unspecified: Secondary | ICD-10-CM

## 2019-10-28 DIAGNOSIS — U071 COVID-19: Secondary | ICD-10-CM

## 2019-10-28 HISTORY — DX: COVID-19: U07.1

## 2019-10-28 LAB — COMPREHENSIVE METABOLIC PANEL
ALT: 9 U/L (ref 0–35)
AST: 15 U/L (ref 0–37)
Albumin: 4.3 g/dL (ref 3.5–5.2)
Alkaline Phosphatase: 49 U/L (ref 39–117)
BUN: 17 mg/dL (ref 6–23)
CO2: 27 mEq/L (ref 19–32)
Calcium: 9.2 mg/dL (ref 8.4–10.5)
Chloride: 106 mEq/L (ref 96–112)
Creatinine, Ser: 0.69 mg/dL (ref 0.40–1.20)
GFR: 91.39 mL/min (ref >60.00)
Glucose, Bld: 90 mg/dL (ref 70–99)
Potassium: 3.9 mEq/L (ref 3.5–5.1)
Sodium: 139 mEq/L (ref 135–145)
Total Bilirubin: 0.4 mg/dL (ref 0.2–1.2)
Total Protein: 7 g/dL (ref 6.0–8.3)

## 2019-10-28 LAB — CBC WITH DIFFERENTIAL/PLATELET
Basophils Absolute: 0 10*3/uL (ref 0.0–0.1)
Basophils Relative: 0.4 % (ref 0.0–3.0)
Eosinophils Absolute: 0.1 10*3/uL (ref 0.0–0.7)
Eosinophils Relative: 1.6 % (ref 0.0–5.0)
HCT: 39.8 % (ref 36.0–46.0)
Hemoglobin: 13.4 g/dL (ref 12.0–15.0)
Lymphocytes Relative: 28.4 % (ref 12.0–46.0)
Lymphs Abs: 2.6 10*3/uL (ref 0.7–4.0)
MCHC: 33.8 g/dL (ref 30.0–36.0)
MCV: 95.4 fl (ref 78.0–100.0)
Monocytes Absolute: 0.7 10*3/uL (ref 0.1–1.0)
Monocytes Relative: 7.7 % (ref 3.0–12.0)
Neutro Abs: 5.6 10*3/uL (ref 1.4–7.7)
Neutrophils Relative %: 61.9 % (ref 43.0–77.0)
Platelets: 304 10*3/uL (ref 150.0–400.0)
RBC: 4.17 Mil/uL (ref 3.87–5.11)
RDW: 12.7 % (ref 11.5–15.5)
WBC: 9 10*3/uL (ref 4.0–10.5)

## 2019-10-28 LAB — TSH: TSH: 0.96 u[IU]/mL (ref 0.35–4.50)

## 2019-10-28 MED ORDER — HYDROXYZINE HCL 25 MG PO TABS: 25.0000 mg | ORAL_TABLET | Freq: Three times a day (TID) | ORAL | 0 refills | Status: DC | PRN

## 2019-10-28 MED FILL — hydrOXYzine HCL 25 MG TABS: 25 | 20 days supply | Qty: 60 | Fill #0

## 2019-10-28 NOTE — Progress Notes (Signed)
Patient: Julie Yang MRN: [id number] DOB: January 22, 1971 PCP: Inda Coke, PA     Subjective:  Chief Complaint  Patient presents with  . Tachycardia    5-6 days strong family history of Afib     HPI: The patient is a 49 y.o. female who presents today for tachycardia and palpitations. Started last Tuesday. She was in bed and felt her heart speed up and it took a few seconds to go away. It happened a few times through the next day. It has continued to happen daily. Episodes can last from 10-15 seconds to 20 minutes. She had one episode where she was dizzy and lightheaded. She feels palpitations and the fast heart rate. Unsure if irregular. She does feel like chest is tight afterwards, but no pain/pressure. No diaphoresis. No history of HTN, diabetes. No hx of smoking. Does have caffeine a few times during the day. NO symptoms with exercise. Does state she is an anxious person, but feels like she copes well.   She has lost 45 pounds this year.   Has had her covid vaccines.   Review of Systems  Constitutional: Negative for chills and fatigue.  Respiratory: Negative for shortness of breath.   Cardiovascular: Positive for palpitations. Negative for leg swelling.       Some chest tightness with palpitations   Gastrointestinal: Negative for nausea and vomiting.  Endocrine: Negative for cold intolerance, heat intolerance and polyuria.  Neurological: Positive for dizziness.       With palpitations     Allergies Patient is allergic to prednisone, aspirin, ibuprofen, and iodine.  Past Medical History Patient  has a past medical history of Abnormal Pap smear of cervix, Anxiety, Genital warts, Thyroid disease (2013), and Urticaria.  Surgical History Patient  has a past surgical history that includes Cesarean section and Cyst excision.  Family History Pateint's family history includes Brain cancer in her maternal grandfather; Breast cancer (age of onset: 26) in her maternal  grandmother; Colon cancer (age of onset: 43) in her maternal uncle; Colon cancer (age of onset: 79) in her maternal grandfather; Crohn's disease in her brother; Diabetes in her maternal grandmother; Eczema in her father; Heart attack in her maternal grandfather and paternal grandfather; Heart disease in her father, maternal grandfather, maternal grandmother, paternal grandfather, and paternal grandmother; Hyperlipidemia in her father; Hypertension in her father, maternal grandfather, maternal grandmother, paternal grandfather, and paternal grandmother; Lung cancer in her maternal grandfather; Stroke in her maternal grandmother.  Social History Patient  reports that she has never smoked. She has never used smokeless tobacco. She reports current alcohol use. She reports that she does not use drugs.    Objective: Vitals:   10/28/19 1355  BP: 118/78  Pulse: 77  Temp: (!) 97 F (36.1 C)  TempSrc: Temporal  SpO2: 100%  Weight: 168 lb (76.2 kg)  Height: 5\' 3"  (1.6 m)    Body mass index is 29.76 kg/m.  Physical Exam Vitals reviewed.  Constitutional:      Appearance: Normal appearance. She is normal weight.  HENT:     Head: Normocephalic and atraumatic.  Eyes:     Extraocular Movements: Extraocular movements intact.     Conjunctiva/sclera: Conjunctivae normal.     Pupils: Pupils are equal, round, and reactive to light.  Cardiovascular:     Rate and Rhythm: Normal rate and regular rhythm.     Heart sounds: Normal heart sounds.  Pulmonary:     Effort: Pulmonary effort is normal.     Breath  sounds: Normal breath sounds.  Abdominal:     General: Abdomen is flat. Bowel sounds are normal.     Palpations: Abdomen is soft.  Musculoskeletal:     Cervical back: Normal range of motion and neck supple.  Neurological:     General: No focal deficit present.     Mental Status: She is alert and oriented to person, place, and time.  Psychiatric:        Mood and Affect: Mood normal.         Behavior: Behavior normal.    Ekg: nsr with rate of 79. No abnormalities.     Assessment/plan: 1. Tachycardia See below.  - EKG 12-Lead  2. Heart palpitations Checking labs, does have history of hypothyroid. Last tsh was normal in 11/2018. zio patch ordered/echo. GAD7 score is mild and could be contributing. Will do hydroxyzine prn if needed. Any shortness of breath, chest pain, etc she is to go to ER. Will f/u with her once I have work up completed.  - CBC with Differential/Platelet - Comprehensive metabolic panel - TSH - ECHOCARDIOGRAM COMPLETE; Future   This visit occurred during the SARS-CoV-2 public health emergency.  Safety protocols were in place, including screening questions prior to the visit, additional usage of staff PPE, and extensive cleaning of exam room while observing appropriate contact time as indicated for disinfecting solutions.    Return if symptoms worsen or fail to improve.   Orma Flaming, MD [facility]   10/28/2019

## 2019-10-28 NOTE — Patient Instructions (Signed)
1) labs/echo (imaging of your heart) and heart monitor ordered. You will wear monitor for 14 days.   2) hydroxyzine prn sent in for anxiety. Can take 1/2 to one tablet as needed every 8 hours for anxiety.   3) will f/u with you once I have results back.

## 2019-10-29 ENCOUNTER — Telehealth: Payer: Self-pay

## 2019-10-29 NOTE — Telephone Encounter (Signed)
14 day Zio AT registered to be mailed to pt's home address. Address verified and went over brief instructions with the pt.

## 2019-11-03 ENCOUNTER — Ambulatory Visit (INDEPENDENT_AMBULATORY_CARE_PROVIDER_SITE_OTHER): Payer: 59

## 2019-11-03 DIAGNOSIS — R002 Palpitations: Secondary | ICD-10-CM

## 2019-11-04 DIAGNOSIS — R002 Palpitations: Secondary | ICD-10-CM | POA: Diagnosis not present

## 2019-11-04 NOTE — Progress Notes (Signed)
I acted as a Education administrator for [employer], PA-C Anselmo Pickler, LPN    Subjective:    Julie Yang is a 49 y.o. female and is here for a comprehensive physical exam.   HPI  Health Maintenance Due  Topic Date Due  . HIV Screening  Never done  . PAP SMEAR-Modifier  11/15/2019    Acute Concerns: Anxiety -- feels like she has some issues with general worrying. Has fear of weight gain and generally worries about daily things. She feels like this is getting worse with time and she wants to start seeing a therapist to discuss this. Palpitations -- saw my colleague 10/28/19 to discuss this. She is currently wearing ZIO patch for 14 d and has echo scheduled.  Chronic Issues: Hypothyroidism -- currently well controlled. Takes Synthroid 75 mcg daily. This is prescribed by her ob-gyn. Vit D deficiency -- takes 2000 IU daily.   Health Maintenance: Immunizations -- UTD Colonoscopy -- 11/2022 Mammogram -- UTD, due 07/2020 PAP -- due now, pt is scheduled with GYN in August. Bone Density -- N/A Diet -- overall healthy Caffeine intake -- minimal Sleep habits -- no concerns Exercise -- walks daily (45-60 min); some strength  Weight -- Weight: 168 lb (76.2 kg)  Mood -- general anxiety Weight history: Wt Readings from Last 10 Encounters:  11/05/19 168 lb (76.2 kg)  10/28/19 168 lb (76.2 kg)  07/21/19 161 lb 8 oz (73.3 kg)  07/02/19 161 lb 4 oz (73.1 kg)  05/17/19 164 lb (74.4 kg)  02/05/19 178 lb (80.7 kg)  04/14/18 205 lb 9.6 oz (93.3 kg)  01/05/18 199 lb 12.8 oz (90.6 kg)  11/21/17 197 lb (89.4 kg)  11/03/17 197 lb (89.4 kg)   Body mass index is 29.76 kg/m. Patient's last menstrual period was 10/24/2019. Period characteristics: no concerns, short Alcohol use: 1 drink per month Tobacco use: none  Depression screen PHQ 2/9 11/05/2019  Decreased Interest 1  Down, Depressed, Hopeless 1  PHQ - 2 Score 2  Altered sleeping 0  Tired, decreased energy 1  Change in appetite 1    Feeling bad or failure about yourself  2  Trouble concentrating 1  Moving slowly or fidgety/restless 0  Suicidal thoughts 0  PHQ-9 Score 7  Difficult doing work/chores Not difficult at all     Other providers/specialists: Patient Care Team: Inda Coke, [place] as PCP - General (Physician Assistant) Regina Eck, CNM as Referring Physician (Certified Nurse Midwife)    PMHx, SurgHx, SocialHx, Medications, and Allergies were reviewed in the Visit Navigator and updated as appropriate.   Past Medical History:  Diagnosis Date  . Abnormal Pap smear of cervix    1990s  . Anxiety   . Genital warts   . Thyroid disease 2013  . Urticaria    chronic autoimmune urticaria     Past Surgical History:  Procedure Laterality Date  . CESAREAN SECTION    . CYST EXCISION     from chest, benign     Family History  Problem Relation Age of Onset  . Heart disease Father   . Hypertension Father   . Hyperlipidemia Father   . Eczema Father   . Heart disease Maternal Grandmother   . Diabetes Maternal Grandmother   . Breast cancer Maternal Grandmother 80  . Hypertension Maternal Grandmother   . Stroke Maternal Grandmother   . Heart disease Maternal Grandfather   . Lung cancer Maternal Grandfather   . Colon cancer Maternal Grandfather 38  .  Brain cancer Maternal Grandfather   . Hypertension Maternal Grandfather   . Heart attack Maternal Grandfather   . Heart disease Paternal Grandmother   . Hypertension Paternal Grandmother   . Heart disease Paternal Grandfather   . Hypertension Paternal Grandfather   . Heart attack Paternal Grandfather   . Crohn's disease Brother   . Colon cancer Maternal Uncle 61  . Allergic rhinitis Neg Hx   . Angioedema Neg Hx   . Asthma Neg Hx   . Immunodeficiency Neg Hx   . Urticaria Neg Hx     Social History   Tobacco Use  . Smoking status: Never Smoker  . Smokeless tobacco: Never Used  Vaping Use  . Vaping Use: Never used  Substance Use  Topics  . Alcohol use: Yes    Comment: 1 a month  . Drug use: No    Review of Systems:   Review of Systems  Constitutional: Negative for chills, fever, malaise/fatigue and weight loss.  HENT: Negative for hearing loss, sinus pain and sore throat.   Eyes: Negative for blurred vision.  Respiratory: Negative for cough and shortness of breath.   Cardiovascular: Negative for chest pain, palpitations and leg swelling.  Gastrointestinal: Positive for heartburn. Negative for abdominal pain, constipation, diarrhea, nausea and vomiting.  Genitourinary: Negative for dysuria, frequency and urgency.  Musculoskeletal: Negative for back pain, myalgias and neck pain.  Skin: Negative for itching and rash.  Neurological: Negative for dizziness, tingling, seizures, loss of consciousness and headaches.  Endo/Heme/Allergies: Negative for polydipsia.  Psychiatric/Behavioral: Negative for depression. The patient is not nervous/anxious.     Objective:   BP 140/90 (BP Location: Left Arm, Patient Position: Sitting, Cuff Size: Normal)   Pulse 70   Temp 98.4 F (36.9 C) (Temporal)   Ht 5\' 3"  (1.6 m)   Wt 168 lb (76.2 kg)   LMP 10/24/2019   SpO2 99%   BMI 29.76 kg/m  Body mass index is 29.76 kg/m.   General Appearance:    Alert, cooperative, no distress, appears stated age  Head:    Normocephalic, without obvious abnormality, atraumatic  Eyes:    PERRL, conjunctiva/corneas clear, EOM's intact, fundi    benign, both eyes  Ears:    Normal TM's and external ear canals, both ears  Nose:   Nares normal, septum midline, mucosa normal, no drainage    or sinus tenderness  Throat:   Lips, mucosa, and tongue normal; teeth and gums normal  Neck:   Supple, symmetrical, trachea midline, no adenopathy;    thyroid:  no enlargement/tenderness/nodules; no carotid   bruit or JVD  Back:     Symmetric, no curvature, ROM normal, no CVA tenderness  Lungs:     Clear to auscultation bilaterally, respirations unlabored    Chest Wall:    No tenderness or deformity   Heart:    Regular rate and rhythm, S1 and S2 normal, no murmur, rub or gallop  Breast Exam:    Deferred  Abdomen:     Soft, non-tender, bowel sounds active all four quadrants,    no masses, no organomegaly  Genitalia:    Deferred  Extremities:   Extremities normal, atraumatic, no cyanosis or edema  Pulses:   2+ and symmetric all extremities  Skin:   Skin color, texture, turgor normal, no rashes or lesions  Lymph nodes:   Cervical, supraclavicular, and axillary nodes normal  Neurologic:   CNII-XII intact, normal strength, sensation and reflexes    throughout   Assessment/Plan:  Julie Yang was seen today for annual exam.  Diagnoses and all orders for this visit:  Encounter for general adult medical examination with abnormal findings Today patient counseled on age appropriate routine health concerns for screening and prevention, each reviewed and up to date or declined. Immunizations reviewed and up to date or declined. Labs ordered and reviewed. Risk factors for depression reviewed and negative. Hearing function and visual acuity are intact. ADLs screened and addressed as needed. Functional ability and level of safety reviewed and appropriate. Education, counseling and referrals performed based on assessed risks today. Patient provided with a copy of personalized plan for preventive services.  Encounter for lipid screening for cardiovascular disease -     Lipid panel  Screening for HIV (human immunodeficiency virus) -     HIV Antibody (routine testing w rflx)  Vitamin D deficiency Update supplement today and provide recommendations as appropriate. -     VITAMIN D 25 Hydroxy (Vit-D Deficiency, Fractures)  Anxiety Uncontrolled. She would like referral for psychology. Referral placed, continue to monitor. -     Ambulatory referral to Psychology  Overweight Continue to work on better diet and exercise.  Heart palpitations Undergoing heart  monitor work-up now, and planning to get echo next month. Await results and determine next steps.   Well Adult Exam: Labs ordered: Yes. Patient counseling was done. See below for items discussed. Discussed the patient's BMI.  The BMI is not in the acceptable range; BMI management plan is completed Follow up in one year. Breast cancer screening: UTD. Cervical cancer screening: UTD   Patient Counseling: [x]    Nutrition: Stressed importance of moderation in sodium/caffeine intake, saturated fat and cholesterol, caloric balance, sufficient intake of fresh fruits, vegetables, fiber, calcium, iron, and 1 mg of folate supplement per day (for females capable of pregnancy).  [x]    Stressed the importance of regular exercise.   [x]    Substance Abuse: Discussed cessation/primary prevention of tobacco, alcohol, or other drug use; driving or other dangerous activities under the influence; availability of treatment for abuse.   [x]    Injury prevention: Discussed safety belts, safety helmets, smoke detector, smoking near bedding or upholstery.   [x]    Sexuality: Discussed sexually transmitted diseases, partner selection, use of condoms, avoidance of unintended pregnancy  and contraceptive alternatives.  [x]    Dental health: Discussed importance of regular tooth brushing, flossing, and dental visits.  [x]    Health maintenance and immunizations reviewed. Please refer to Health maintenance section.   CMA or LPN served as scribe during this visit. History, Physical, and Plan performed by medical provider. The above documentation has been reviewed and is accurate and complete.   Inda Coke, PA-C [place]

## 2019-11-05 ENCOUNTER — Ambulatory Visit (INDEPENDENT_AMBULATORY_CARE_PROVIDER_SITE_OTHER): Payer: 59 | Admitting: Physician Assistant

## 2019-11-05 ENCOUNTER — Other Ambulatory Visit: Payer: Self-pay

## 2019-11-05 ENCOUNTER — Encounter: Payer: Self-pay | Admitting: Physician Assistant

## 2019-11-05 VITALS — BP 140/90 | HR 70 | Temp 98.4°F | Ht 63.0 in | Wt 168.0 lb

## 2019-11-05 DIAGNOSIS — F419 Anxiety disorder, unspecified: Secondary | ICD-10-CM

## 2019-11-05 DIAGNOSIS — Z114 Encounter for screening for human immunodeficiency virus [HIV]: Secondary | ICD-10-CM | POA: Diagnosis not present

## 2019-11-05 DIAGNOSIS — Z136 Encounter for screening for cardiovascular disorders: Secondary | ICD-10-CM

## 2019-11-05 DIAGNOSIS — Z13220 Encounter for screening for lipoid disorders: Secondary | ICD-10-CM

## 2019-11-05 DIAGNOSIS — E663 Overweight: Secondary | ICD-10-CM

## 2019-11-05 DIAGNOSIS — R002 Palpitations: Secondary | ICD-10-CM

## 2019-11-05 DIAGNOSIS — E559 Vitamin D deficiency, unspecified: Secondary | ICD-10-CM | POA: Diagnosis not present

## 2019-11-05 DIAGNOSIS — Z0001 Encounter for general adult medical examination with abnormal findings: Secondary | ICD-10-CM | POA: Diagnosis not present

## 2019-11-05 LAB — LIPID PANEL
Cholesterol: 155 mg/dL (ref 0–200)
HDL: 54.2 mg/dL (ref >39.00)
LDL Cholesterol: 86 mg/dL (ref 0–99)
NonHDL: 101.25
Total CHOL/HDL Ratio: 3
Triglycerides: 77 mg/dL (ref 0.0–149.0)
VLDL: 15.4 mg/dL (ref 0.0–40.0)

## 2019-11-05 LAB — VITAMIN D 25 HYDROXY (VIT D DEFICIENCY, FRACTURES): VITD: 49.98 ng/mL (ref 30.00–100.00)

## 2019-11-05 NOTE — Patient Instructions (Addendum)
It was great to see you!  Please go to the lab for blood work.   Someone will be in touch with you in regards to your appointment for a therapist.  Our office will call you with your results unless you have chosen to receive results via [facility].  If your blood work is normal we will follow-up each year for physicals and as scheduled for chronic medical problems.  If anything is abnormal we will treat accordingly and get you in for a follow-up.  Take care,  [employer] Maintenance, Female Adopting a healthy lifestyle and getting preventive care are important in promoting health and wellness. Ask your health care provider about:  The right schedule for you to have regular tests and exams.  Things you can do on your own to prevent diseases and keep yourself healthy. What should I know about diet, weight, and exercise? Eat a healthy diet   Eat a diet that includes plenty of vegetables, fruits, low-fat dairy products, and lean protein.  Do not eat a lot of foods that are high in solid fats, added sugars, or sodium. Maintain a healthy weight Body mass index (BMI) is used to identify weight problems. It estimates body fat based on height and weight. Your health care provider can help determine your BMI and help you achieve or maintain a healthy weight. Get regular exercise Get regular exercise. This is one of the most important things you can do for your health. Most adults should:  Exercise for at least 150 minutes each week. The exercise should increase your heart rate and make you sweat (moderate-intensity exercise).  Do strengthening exercises at least twice a week. This is in addition to the moderate-intensity exercise.  Spend less time sitting. Even light physical activity can be beneficial. Watch cholesterol and blood lipids Have your blood tested for lipids and cholesterol at 47 years of age, then have this test every 5 years. Have your cholesterol levels checked  more often if:  Your lipid or cholesterol levels are high.  You are older than 47 years of age.  You are at high risk for heart disease. What should I know about cancer screening? Depending on your health history and family history, you may need to have cancer screening at various ages. This may include screening for:  Breast cancer.  Cervical cancer.  Colorectal cancer.  Skin cancer.  Lung cancer. What should I know about heart disease, diabetes, and high blood pressure? Blood pressure and heart disease  High blood pressure causes heart disease and increases the risk of stroke. This is more likely to develop in people who have high blood pressure readings, are of African descent, or are overweight.  Have your blood pressure checked: ? Every 3-5 years if you are 94-19 years of age. ? Every year if you are 41 years old or older. Diabetes Have regular diabetes screenings. This checks your fasting blood sugar level. Have the screening done:  Once every three years after age 24 if you are at a normal weight and have a low risk for diabetes.  More often and at a younger age if you are overweight or have a high risk for diabetes. What should I know about preventing infection? Hepatitis B If you have a higher risk for hepatitis B, you should be screened for this virus. Talk with your health care provider to find out if you are at risk for hepatitis B infection. Hepatitis C Testing is recommended for:  Everyone born from [birth year] through 1963.  Anyone with known risk factors for hepatitis C. Sexually transmitted infections (STIs)  Get screened for STIs, including gonorrhea and chlamydia, if: ? You are sexually active and are younger than 47 years of age. ? You are older than 47 years of age and your health care provider tells you that you are at risk for this type of infection. ? Your sexual activity has changed since you were last screened, and you are at increased risk for  chlamydia or gonorrhea. Ask your health care provider if you are at risk.  Ask your health care provider about whether you are at high risk for HIV. Your health care provider may recommend a prescription medicine to help prevent HIV infection. If you choose to take medicine to prevent HIV, you should first get tested for HIV. You should then be tested every 3 months for as long as you are taking the medicine. Pregnancy  If you are about to stop having your period (premenopausal) and you may become pregnant, seek counseling before you get pregnant.  Take 400 to 800 micrograms (mcg) of folic acid every day if you become pregnant.  Ask for birth control (contraception) if you want to prevent pregnancy. Osteoporosis and menopause Osteoporosis is a disease in which the bones lose minerals and strength with aging. This can result in bone fractures. If you are 34 years old or older, or if you are at risk for osteoporosis and fractures, ask your health care provider if you should:  Be screened for bone loss.  Take a calcium or vitamin D supplement to lower your risk of fractures.  Be given hormone replacement therapy (HRT) to treat symptoms of menopause. Follow these instructions at home: Lifestyle  Do not use any products that contain nicotine or tobacco, such as cigarettes, e-cigarettes, and chewing tobacco. If you need help quitting, ask your health care provider.  Do not use street drugs.  Do not share needles.  Ask your health care provider for help if you need support or information about quitting drugs. Alcohol use  Do not drink alcohol if: ? Your health care provider tells you not to drink. ? You are pregnant, may be pregnant, or are planning to become pregnant.  If you drink alcohol: ? Limit how much you use to 0-1 drink a day. ? Limit intake if you are breastfeeding.  Be aware of how much alcohol is in your drink. In the [place] one drink equals one 12 oz bottle of beer (355  mL), one 5 oz glass of wine (148 mL), or one 1 oz glass of hard liquor (44 mL). General instructions  Schedule regular health, dental, and eye exams.  Stay current with your vaccines.  Tell your health care provider if: ? You often feel depressed. ? You have ever been abused or do not feel safe at home. Summary  Adopting a healthy lifestyle and getting preventive care are important in promoting health and wellness.  Follow your health care provider's instructions about healthy diet, exercising, and getting tested or screened for diseases.  Follow your health care provider's instructions on monitoring your cholesterol and blood pressure. This information is not intended to replace advice given to you by your health care provider. Make sure you discuss any questions you have with your health care provider. Document Revised: 04/08/2018 Document Reviewed: 04/08/2018 Elsevier Patient Education  2020 [employer].

## 2019-11-06 LAB — HIV ANTIBODY (ROUTINE TESTING W REFLEX): HIV 1&2 Ab, 4th Generation: NONREACTIVE

## 2019-11-19 ENCOUNTER — Other Ambulatory Visit: Payer: Self-pay

## 2019-11-19 ENCOUNTER — Ambulatory Visit (HOSPITAL_COMMUNITY): Payer: 59 | Attending: Cardiology

## 2019-11-19 DIAGNOSIS — R002 Palpitations: Secondary | ICD-10-CM | POA: Insufficient documentation

## 2019-11-19 LAB — ECHOCARDIOGRAM COMPLETE
AR max vel: 2.15 cm2
AV Area VTI: 2.26 cm2
AV Area mean vel: 2.08 cm2
AV Mean grad: 7 mmHg
AV Peak grad: 13.5 mmHg
Ao pk vel: 1.84 m/s
Area-P 1/2: 4.49 cm2
S' Lateral: 3.2 cm

## 2019-11-19 MED ORDER — PERFLUTREN LIPID MICROSPHERE
1.0000 mL | INTRAVENOUS | Status: AC | PRN
  Administered 2019-11-19: 1 mL via INTRAVENOUS

## 2019-11-22 ENCOUNTER — Other Ambulatory Visit (HOSPITAL_COMMUNITY): Payer: 59

## 2019-11-29 ENCOUNTER — Ambulatory Visit (INDEPENDENT_AMBULATORY_CARE_PROVIDER_SITE_OTHER): Payer: 59 | Admitting: Professional

## 2019-11-29 DIAGNOSIS — F4323 Adjustment disorder with mixed anxiety and depressed mood: Secondary | ICD-10-CM

## 2019-11-30 DIAGNOSIS — Z20822 Contact with and (suspected) exposure to covid-19: Secondary | ICD-10-CM | POA: Diagnosis not present

## 2019-12-06 DIAGNOSIS — Z20822 Contact with and (suspected) exposure to covid-19: Secondary | ICD-10-CM | POA: Diagnosis not present

## 2019-12-08 NOTE — Progress Notes (Deleted)
49 y.o. G24P1001 Married Caucasian female here for annual exam.    PCP:     No LMP recorded.           Sexually active: {yes no:314532}  The current method of family planning is vasectomy.    Exercising: {yes no:314532}  {types:19826} Smoker:  no  Health Maintenance: Pap: 11-14-16 neg, 10-26-15 Neg:Neg HR HPV History of abnormal Pap:  {YES NO:22349} MMG:  08-09-19 3D/Neg/density B/Birads1 Colonoscopy:  2019 f/u 77yrs per patient BMD:   ***  Result  *** TDaP: 05-31-15 Gardasil:   no HIV: Neg years ago Hep C: 10-26-15 Neg Screening Labs:  Hb today: ***, Urine today: ***   reports that she has never smoked. She has never used smokeless tobacco. She reports current alcohol use. She reports that she does not use drugs.  Past Medical History:  Diagnosis Date  . Abnormal Pap smear of cervix    1990s  . Anxiety   . Genital warts   . Thyroid disease 2013  . Urticaria    chronic autoimmune urticaria    Past Surgical History:  Procedure Laterality Date  . CESAREAN SECTION    . CYST EXCISION     from chest, benign    Current Outpatient Medications  Medication Sig Dispense Refill  . Cholecalciferol (VITAMIN D) 2000 units CAPS Take 1 capsule by mouth daily.    . hydrOXYzine (ATARAX/VISTARIL) 25 MG tablet Take 1 tablet (25 mg total) by mouth 3 (three) times daily as needed for anxiety. 60 tablet 0  . Probiotic Product (PROBIOTIC ADVANCED PO) Take 1 capsule by mouth daily.    Marland Kitchen SYNTHROID 75 MCG tablet Take 1 tablet (75 mcg total) by mouth daily before breakfast. 90 tablet 3   No current facility-administered medications for this visit.    Family History  Problem Relation Age of Onset  . Heart disease Father   . Hypertension Father   . Hyperlipidemia Father   . Eczema Father   . Heart disease Maternal Grandmother   . Diabetes Maternal Grandmother   . Breast cancer Maternal Grandmother 80  . Hypertension Maternal Grandmother   . Stroke Maternal Grandmother   . Heart disease  Maternal Grandfather   . Lung cancer Maternal Grandfather   . Colon cancer Maternal Grandfather 66  . Brain cancer Maternal Grandfather   . Hypertension Maternal Grandfather   . Heart attack Maternal Grandfather   . Heart disease Paternal Grandmother   . Hypertension Paternal Grandmother   . Heart disease Paternal Grandfather   . Hypertension Paternal Grandfather   . Heart attack Paternal Grandfather   . Crohn's disease Brother   . Colon cancer Maternal Uncle 71  . Allergic rhinitis Neg Hx   . Angioedema Neg Hx   . Asthma Neg Hx   . Immunodeficiency Neg Hx   . Urticaria Neg Hx     Review of Systems  Exam:   There were no vitals taken for this visit.    General appearance: alert, cooperative and appears stated age Head: normocephalic, without obvious abnormality, atraumatic Neck: no adenopathy, supple, symmetrical, trachea midline and thyroid normal to inspection and palpation Lungs: clear to auscultation bilaterally Breasts: normal appearance, no masses or tenderness, No nipple retraction or dimpling, No nipple discharge or bleeding, No axillary adenopathy Heart: regular rate and rhythm Abdomen: soft, non-tender; no masses, no organomegaly Extremities: extremities normal, atraumatic, no cyanosis or edema Skin: skin color, texture, turgor normal. No rashes or lesions Lymph nodes: cervical, supraclavicular, and axillary  nodes normal. Neurologic: grossly normal  Pelvic: External genitalia:  no lesions              No abnormal inguinal nodes palpated.              Urethra:  normal appearing urethra with no masses, tenderness or lesions              Bartholins and Skenes: normal                 Vagina: normal appearing vagina with normal color and discharge, no lesions              Cervix: no lesions              Pap taken: {yes no:314532} Bimanual Exam:  Uterus:  normal size, contour, position, consistency, mobility, non-tender              Adnexa: no mass, fullness,  tenderness              Rectal exam: {yes no:314532}.  Confirms.              Anus:  normal sphincter tone, no lesions  Chaperone was present for exam.  Assessment:   Well woman visit with normal exam.   Plan: Mammogram screening discussed. Self breast awareness reviewed. Pap and HR HPV as above. Guidelines for Calcium, Vitamin D, regular exercise program including cardiovascular and weight bearing exercise.   Follow up annually and prn.   Additional counseling given.  {yes [id number]. _______ minutes face to face time of which over 50% was spent in counseling.    After visit summary provided.

## 2019-12-13 ENCOUNTER — Ambulatory Visit: Payer: 59 | Admitting: Obstetrics and Gynecology

## 2019-12-13 ENCOUNTER — Ambulatory Visit: Payer: 59 | Admitting: Certified Nurse Midwife

## 2019-12-23 ENCOUNTER — Ambulatory Visit: Payer: 59 | Admitting: Professional

## 2019-12-27 ENCOUNTER — Ambulatory Visit: Payer: 59 | Admitting: Professional

## 2019-12-27 NOTE — Progress Notes (Signed)
49 y.o. G40P1001 Married Caucasian female here for annual exam.    Patient was having palpitations.  Her TSH was low normal.  Saw cariology and did ZIO monitoring for 2 weeks.  Normal results.  Cut down her caffeine intake, which helped.  Also received hydroxyzine Rx to take prn for anxiety.   Lost 45 pounds during Covid and gained a little bit back.   Increased sensation of heat at night.   Had Covid in July. Was fully vaccinated.  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  Daughter in middle school.   PCP:  Inda Coke, PA  Patient's last menstrual period was 12/15/2019 (exact date).     Period Cycle (Days): 30 Period Duration (Days): 2-4 days Period Pattern: Regular Menstrual Flow: Moderate Menstrual Control: Tampon, Maxi pad Menstrual Control Change Freq (Hours): changes every 4 hours Dysmenorrhea: (!) Mild Dysmenorrhea Symptoms:  (mood swings)     Sexually active: Yes.    The current method of family planning is vasectomy.    Exercising: Yes.    walks 45 minutes daily and some strength Smoker:  no  Health Maintenance: Pap: 11-14-16 neg, 10-26-15 Neg:Neg HR HPV History of abnormal Pap:Yes, Hx of abnormal pap in 1990's--Hx HPV and possible cryotherapy MMG:  08-09-19 3D/Neg/density B/Birads1 Colonoscopy: 12-23-17 polyps;next 11/2022 BMD:   n/a  Result  n/a TDaP:  05-31-15 Gardasil:   no HIV:11-05-19 NR Hep C:Neg 2017 Screening Labs:  PCP.    reports that she has never smoked. She has never used smokeless tobacco. She reports current alcohol use. She reports that she does not use drugs.  Past Medical History:  Diagnosis Date  . Abnormal Pap smear of cervix    1990s--hx of cryotherapy to cervix  . Anxiety   . Genital warts   . Thyroid disease 2013  . Urticaria    chronic autoimmune urticaria    Past Surgical History:  Procedure Laterality Date  . CESAREAN SECTION    . CYST EXCISION     from chest, benign    Current Outpatient Medications  Medication Sig  Dispense Refill  . Cholecalciferol (VITAMIN D) 2000 units CAPS Take 1 capsule by mouth daily.    . hydrOXYzine (ATARAX/VISTARIL) 25 MG tablet Take 1 tablet (25 mg total) by mouth 3 (three) times daily as needed for anxiety. 60 tablet 0  . Probiotic Product (PROBIOTIC ADVANCED PO) Take 1 capsule by mouth daily.    Marland Kitchen SYNTHROID 75 MCG tablet Take 1 tablet (75 mcg total) by mouth daily before breakfast. 90 tablet 3   No current facility-administered medications for this visit.    Family History  Problem Relation Age of Onset  . Heart disease Father   . Hypertension Father   . Hyperlipidemia Father   . Eczema Father   . Heart disease Maternal Grandmother   . Diabetes Maternal Grandmother   . Breast cancer Maternal Grandmother 80  . Hypertension Maternal Grandmother   . Stroke Maternal Grandmother   . Heart disease Maternal Grandfather   . Lung cancer Maternal Grandfather   . Colon cancer Maternal Grandfather 4  . Brain cancer Maternal Grandfather   . Hypertension Maternal Grandfather   . Heart attack Maternal Grandfather   . Heart disease Paternal Grandmother   . Hypertension Paternal Grandmother   . Heart disease Paternal Grandfather   . Hypertension Paternal Grandfather   . Heart attack Paternal Grandfather   . Crohn's disease Brother   . Colon cancer Maternal Uncle 40  . Atrial fibrillation Mother   .  Allergic rhinitis Neg Hx   . Angioedema Neg Hx   . Asthma Neg Hx   . Immunodeficiency Neg Hx   . Urticaria Neg Hx     Review of Systems  All other systems reviewed and are negative.   Exam:   BP 124/76   Pulse 66   Resp 14   Ht 5' 2.5" (1.588 m)   Wt 171 lb 3.2 oz (77.7 kg)   LMP 12/15/2019 (Exact Date)   BMI 30.81 kg/m     General appearance: alert, cooperative and appears stated age Head: normocephalic, without obvious abnormality, atraumatic Neck: no adenopathy, supple, symmetrical, trachea midline and thyroid normal to inspection and palpation Lungs: clear to  auscultation bilaterally Breasts: normal appearance, no masses or tenderness, No nipple retraction or dimpling, No nipple discharge or bleeding, No axillary adenopathy Heart: regular rate and rhythm Abdomen: soft, non-tender; no masses, no organomegaly Extremities: extremities normal, atraumatic, no cyanosis or edema Skin: skin color, texture, turgor normal. No rashes or lesions Lymph nodes: cervical, supraclavicular, and axillary nodes normal. Neurologic: grossly normal  Pelvic: External genitalia:  no lesions              No abnormal inguinal nodes palpated.              Urethra:  normal appearing urethra with no masses, tenderness or lesions              Bartholins and Skenes: normal                 Vagina: normal appearing vagina with normal color and discharge, no lesions              Cervix: no lesions              Pap taken: Yes.   Bimanual Exam:  Uterus:  normal size, contour, position, consistency, mobility, non-tender              Adnexa: no mass, fullness, tenderness              Rectal exam: Yes.  .  Confirms.              Anus:  normal sphincter tone, no lesions.  Hemorrhoids.   Chaperone was present for exam.  Assessment:   Well woman visit with normal exam. Remote hx abnormal pap.   Plan: Mammogram screening discussed. Self breast awareness reviewed. Pap and HR HPV as above. Guidelines for Calcium, Vitamin D, regular exercise program including cardiovascular and weight bearing exercise. She will reach out to her PCP regarding recheck of her TFTs. Follow up annually and prn.   After visit summary provided.

## 2019-12-28 ENCOUNTER — Encounter: Payer: Self-pay | Admitting: Obstetrics and Gynecology

## 2019-12-28 ENCOUNTER — Other Ambulatory Visit (HOSPITAL_COMMUNITY)
Admission: RE | Admit: 2019-12-28 | Discharge: 2019-12-28 | Disposition: A | Discharge status: Home / Self Care | Payer: 59 | Source: Ambulatory Visit | Attending: Obstetrics and Gynecology | Admitting: Obstetrics and Gynecology

## 2019-12-28 ENCOUNTER — Ambulatory Visit (INDEPENDENT_AMBULATORY_CARE_PROVIDER_SITE_OTHER): Payer: 59 | Admitting: Obstetrics and Gynecology

## 2019-12-28 ENCOUNTER — Encounter: Payer: Self-pay | Admitting: Physician Assistant

## 2019-12-28 ENCOUNTER — Other Ambulatory Visit: Payer: Self-pay

## 2019-12-28 VITALS — BP 124/76 | HR 66 | Resp 14 | Ht 62.5 in | Wt 171.2 lb

## 2019-12-28 DIAGNOSIS — Z01419 Encounter for gynecological examination (general) (routine) without abnormal findings: Secondary | ICD-10-CM | POA: Insufficient documentation

## 2019-12-28 NOTE — Patient Instructions (Signed)

## 2019-12-29 ENCOUNTER — Ambulatory Visit (INDEPENDENT_AMBULATORY_CARE_PROVIDER_SITE_OTHER): Payer: 59 | Admitting: Professional

## 2019-12-29 DIAGNOSIS — F4323 Adjustment disorder with mixed anxiety and depressed mood: Secondary | ICD-10-CM | POA: Diagnosis not present

## 2019-12-29 LAB — CYTOLOGY - PAP
Comment: NEGATIVE
Diagnosis: NEGATIVE
High risk HPV: NEGATIVE

## 2020-01-04 ENCOUNTER — Other Ambulatory Visit: Payer: Self-pay

## 2020-01-04 ENCOUNTER — Encounter: Payer: Self-pay | Admitting: Physician Assistant

## 2020-01-04 ENCOUNTER — Ambulatory Visit (INDEPENDENT_AMBULATORY_CARE_PROVIDER_SITE_OTHER): Payer: 59 | Admitting: Physician Assistant

## 2020-01-04 VITALS — BP 116/78 | HR 68 | Temp 97.7°F | Ht 62.5 in | Wt 174.0 lb

## 2020-01-04 DIAGNOSIS — E039 Hypothyroidism, unspecified: Secondary | ICD-10-CM

## 2020-01-04 NOTE — Patient Instructions (Signed)
It was great to see you!  I will be in touch as soon as we get your lab work back.  Follow-up will be based upon results.  Take care,  Inda Coke PA-C

## 2020-01-04 NOTE — Progress Notes (Signed)
Julie Yang is a 49 y.o. female is here for follow up.  I acted as a Education administrator for [employer], PA-C Anselmo Pickler, LPN   History of Present Illness:   Chief Complaint  Patient presents with   Hypothyroidism    HPI   Hypothyroidism Diagnosed in 2014. Started on Synthroid 25 mcg and over the past few years this has gradually increased to 75 mcg daily. This was incidentally found on labwork with ob-gyn. She is wanting [place] to take over management of this. Compliant with medication. Denies heat/cold tolerance, no weight issue, no increase in anxiety.  There are no preventive care reminders to display for this patient.  Past Medical History:  Diagnosis Date   Abnormal Pap smear of cervix    1990s--hx of cryotherapy to cervix   Anxiety    COVID-19 virus infection 10/2019   Genital warts    Thyroid disease 2013   Urticaria    chronic autoimmune urticaria     Social History   Tobacco Use   Smoking status: Never Smoker   Smokeless tobacco: Never Used  Vaping Use   Vaping Use: Never used  Substance Use Topics   Alcohol use: Yes    Comment: 3 a month   Drug use: No    Past Surgical History:  Procedure Laterality Date   CESAREAN SECTION     CYST EXCISION     from chest, benign    Family History  Problem Relation Age of Onset   Heart disease Father    Hypertension Father    Hyperlipidemia Father    Eczema Father    Heart disease Maternal Grandmother    Diabetes Maternal Grandmother    Breast cancer Maternal Grandmother 4   Hypertension Maternal Grandmother    Stroke Maternal Grandmother    Heart disease Maternal Grandfather    Lung cancer Maternal Grandfather    Colon cancer Maternal Grandfather 77   Brain cancer Maternal Grandfather    Hypertension Maternal Grandfather    Heart attack Maternal Grandfather    Heart disease Paternal Grandmother    Hypertension Paternal Grandmother    Heart disease Paternal Grandfather     Hypertension Paternal Grandfather    Heart attack Paternal Grandfather    Crohn's disease Brother    Colon cancer Maternal Uncle 40   Atrial fibrillation Mother    Allergic rhinitis Neg Hx    Angioedema Neg Hx    Asthma Neg Hx    Immunodeficiency Neg Hx    Urticaria Neg Hx     PMHx, SurgHx, SocialHx, FamHx, Medications, and Allergies were reviewed in the Visit Navigator and updated as appropriate.   Patient Active Problem List   Diagnosis Date Noted   Vitamin D deficiency 11/10/2017   Other allergic rhinitis 12/23/2016   Hypothyroidism 10/26/2015    Class: History of   Chronic urticaria 09/19/2015   Allergic reaction 09/19/2015   Angioedema 09/19/2015    Social History   Tobacco Use   Smoking status: Never Smoker   Smokeless tobacco: Never Used  Vaping Use   Vaping Use: Never used  Substance Use Topics   Alcohol use: Yes    Comment: 3 a month   Drug use: No    Current Medications and Allergies:    Current Outpatient Medications:    Cholecalciferol (VITAMIN D) 2000 units CAPS, Take 1 capsule by mouth daily., Disp: , Rfl:    hydrOXYzine (ATARAX/VISTARIL) 25 MG tablet, Take 1 tablet (25 mg total) by mouth 3 (  three) times daily as needed for anxiety., Disp: 60 tablet, Rfl: 0   Probiotic Product (PROBIOTIC ADVANCED PO), Take 1 capsule by mouth daily., Disp: , Rfl:    SYNTHROID 75 MCG tablet, Take 1 tablet (75 mcg total) by mouth daily before breakfast., Disp: 90 tablet, Rfl: 3   Allergies  Allergen Reactions   Prednisone Hives   Aspirin Hives    Exacerbates hives   Ibuprofen Hives    Exacerbates hives   Iodine Hives    Review of Systems   ROS  Negative unless otherwise specified per HPI.  Vitals:   Vitals:   01/04/20 0739  BP: 116/78  Pulse: 68  Temp: 97.7 F (36.5 C)  TempSrc: Temporal  SpO2: 100%  Weight: 174 lb (78.9 kg)  Height: 5' 2.5" (1.588 m)     Body mass index is 31.32 kg/m.   Physical Exam:     Physical Exam Vitals and nursing note reviewed.  Constitutional:      General: She is not in acute distress.    Appearance: She is well-developed. She is not ill-appearing or toxic-appearing.  Cardiovascular:     Rate and Rhythm: Normal rate and regular rhythm.     Pulses: Normal pulses.     Heart sounds: Normal heart sounds, S1 normal and S2 normal.     Comments: No LE edema Pulmonary:     Effort: Pulmonary effort is normal.     Breath sounds: Normal breath sounds.  Skin:    General: Skin is warm and dry.  Neurological:     Mental Status: She is alert.     GCS: GCS eye subscore is 4. GCS verbal subscore is 5. GCS motor subscore is 6.  Psychiatric:        Speech: Speech normal.        Behavior: Behavior normal. Behavior is cooperative.      Assessment and Plan:    [name] was seen today for hypothyroidism.  Diagnoses and all orders for this visit:  Hypothyroidism, unspecified type -     TSH; Future   Update TSH. Follow-up in 6 weeks if we change the dosage and 6 months if we maintain the dosage.   Reviewed expectations re: course of current medical issues.  Discussed self-management of symptoms.  Outlined signs and symptoms indicating need for more acute intervention.  Patient verbalized understanding and all questions were answered.  See orders for this visit as documented in the electronic medical record.  Patient received an After Visit Summary.  CMA or LPN served as scribe during this visit. History, Physical, and Plan performed by medical provider. The above documentation has been reviewed and is accurate and complete.  Inda Coke, PA-C [place] [place] 01/04/2020  Follow-up: No follow-ups on file.

## 2020-01-05 LAB — TSH: TSH: 2.85 mIU/L

## 2020-01-11 ENCOUNTER — Encounter: Payer: Self-pay | Admitting: Physician Assistant

## 2020-01-11 DIAGNOSIS — E039 Hypothyroidism, unspecified: Secondary | ICD-10-CM

## 2020-01-12 ENCOUNTER — Other Ambulatory Visit: Payer: Self-pay | Admitting: Physician Assistant

## 2020-01-12 MED ORDER — SYNTHROID 75 MCG PO TABS: 75.0000 ug | ORAL_TABLET | Freq: Every day | ORAL | 3 refills | Status: DC

## 2020-01-12 MED FILL — SYNTHROID 75 MCG TABLET: 75 | 90 days supply | Qty: 90 | Fill #0

## 2020-01-13 ENCOUNTER — Ambulatory Visit (INDEPENDENT_AMBULATORY_CARE_PROVIDER_SITE_OTHER): Payer: 59 | Admitting: Professional

## 2020-01-13 DIAGNOSIS — F4323 Adjustment disorder with mixed anxiety and depressed mood: Secondary | ICD-10-CM

## 2020-01-26 ENCOUNTER — Ambulatory Visit: Payer: 59 | Admitting: Professional

## 2020-02-10 ENCOUNTER — Ambulatory Visit: Payer: 59 | Admitting: Professional

## 2020-02-17 ENCOUNTER — Ambulatory Visit (INDEPENDENT_AMBULATORY_CARE_PROVIDER_SITE_OTHER): Payer: 59 | Admitting: Professional

## 2020-02-17 DIAGNOSIS — F4323 Adjustment disorder with mixed anxiety and depressed mood: Secondary | ICD-10-CM

## 2020-03-01 ENCOUNTER — Ambulatory Visit (INDEPENDENT_AMBULATORY_CARE_PROVIDER_SITE_OTHER): Payer: 59 | Admitting: Professional

## 2020-03-01 DIAGNOSIS — F4323 Adjustment disorder with mixed anxiety and depressed mood: Secondary | ICD-10-CM | POA: Diagnosis not present

## 2020-03-15 ENCOUNTER — Ambulatory Visit (INDEPENDENT_AMBULATORY_CARE_PROVIDER_SITE_OTHER): Payer: 59 | Admitting: Professional

## 2020-03-15 DIAGNOSIS — F4323 Adjustment disorder with mixed anxiety and depressed mood: Secondary | ICD-10-CM

## 2020-03-27 ENCOUNTER — Ambulatory Visit (INDEPENDENT_AMBULATORY_CARE_PROVIDER_SITE_OTHER): Payer: 59 | Admitting: Professional

## 2020-03-27 DIAGNOSIS — F4323 Adjustment disorder with mixed anxiety and depressed mood: Secondary | ICD-10-CM

## 2020-04-10 ENCOUNTER — Ambulatory Visit (INDEPENDENT_AMBULATORY_CARE_PROVIDER_SITE_OTHER): Payer: 59 | Admitting: Professional

## 2020-04-10 DIAGNOSIS — F4323 Adjustment disorder with mixed anxiety and depressed mood: Secondary | ICD-10-CM | POA: Diagnosis not present

## 2020-05-08 MED FILL — SYNTHROID 75 MCG TABLET: 75 | 90 days supply | Qty: 90 | Fill #1

## 2020-05-09 ENCOUNTER — Ambulatory Visit (INDEPENDENT_AMBULATORY_CARE_PROVIDER_SITE_OTHER): Payer: 59 | Admitting: Professional

## 2020-05-09 DIAGNOSIS — F4323 Adjustment disorder with mixed anxiety and depressed mood: Secondary | ICD-10-CM

## 2020-05-23 ENCOUNTER — Ambulatory Visit: Payer: 59 | Admitting: Professional

## 2020-06-05 ENCOUNTER — Ambulatory Visit (INDEPENDENT_AMBULATORY_CARE_PROVIDER_SITE_OTHER): Payer: 59 | Admitting: Professional

## 2020-06-05 DIAGNOSIS — F4323 Adjustment disorder with mixed anxiety and depressed mood: Secondary | ICD-10-CM

## 2020-06-13 ENCOUNTER — Ambulatory Visit: Payer: 59 | Admitting: Obstetrics and Gynecology

## 2020-06-22 ENCOUNTER — Ambulatory Visit: Payer: 59 | Admitting: Professional

## 2020-06-26 ENCOUNTER — Other Ambulatory Visit: Payer: Self-pay | Admitting: Obstetrics and Gynecology

## 2020-06-26 DIAGNOSIS — Z1231 Encounter for screening mammogram for malignant neoplasm of breast: Secondary | ICD-10-CM

## 2020-07-20 ENCOUNTER — Other Ambulatory Visit (HOSPITAL_BASED_OUTPATIENT_CLINIC_OR_DEPARTMENT_OTHER): Payer: Self-pay

## 2020-08-01 ENCOUNTER — Ambulatory Visit (INDEPENDENT_AMBULATORY_CARE_PROVIDER_SITE_OTHER): Payer: 59 | Admitting: Professional

## 2020-08-01 DIAGNOSIS — F4323 Adjustment disorder with mixed anxiety and depressed mood: Secondary | ICD-10-CM

## 2020-08-21 ENCOUNTER — Other Ambulatory Visit (HOSPITAL_COMMUNITY): Payer: Self-pay

## 2020-08-21 MED FILL — Levothyroxine Sodium Tab 75 MCG: ORAL | 90 days supply | Qty: 90 | Fill #0 | Status: AC

## 2020-08-22 ENCOUNTER — Other Ambulatory Visit (HOSPITAL_COMMUNITY): Payer: Self-pay

## 2020-08-24 ENCOUNTER — Ambulatory Visit: Payer: 59

## 2020-09-06 ENCOUNTER — Ambulatory Visit: Payer: 59 | Admitting: Professional

## 2020-09-08 ENCOUNTER — Inpatient Hospital Stay: Admission: RE | Admit: 2020-09-08 | Payer: 59 | Source: Ambulatory Visit

## 2020-09-12 ENCOUNTER — Encounter: Payer: Self-pay | Admitting: Physician Assistant

## 2020-09-14 ENCOUNTER — Other Ambulatory Visit: Payer: Self-pay

## 2020-09-14 ENCOUNTER — Ambulatory Visit (HOSPITAL_BASED_OUTPATIENT_CLINIC_OR_DEPARTMENT_OTHER): Payer: 59 | Admitting: Family Medicine

## 2020-09-14 ENCOUNTER — Ambulatory Visit (HOSPITAL_BASED_OUTPATIENT_CLINIC_OR_DEPARTMENT_OTHER)
Admission: RE | Admit: 2020-09-14 | Discharge: 2020-09-14 | Disposition: A | Discharge status: Home / Self Care | Payer: 59 | Source: Ambulatory Visit | Attending: Obstetrics and Gynecology | Admitting: Obstetrics and Gynecology

## 2020-09-14 DIAGNOSIS — Z1231 Encounter for screening mammogram for malignant neoplasm of breast: Secondary | ICD-10-CM | POA: Insufficient documentation

## 2020-09-22 ENCOUNTER — Other Ambulatory Visit: Payer: Self-pay

## 2020-09-22 ENCOUNTER — Ambulatory Visit (HOSPITAL_BASED_OUTPATIENT_CLINIC_OR_DEPARTMENT_OTHER): Payer: 59 | Admitting: Family Medicine

## 2020-09-22 ENCOUNTER — Encounter (HOSPITAL_BASED_OUTPATIENT_CLINIC_OR_DEPARTMENT_OTHER): Payer: Self-pay | Admitting: Family Medicine

## 2020-09-22 ENCOUNTER — Other Ambulatory Visit (HOSPITAL_COMMUNITY): Payer: Self-pay

## 2020-09-22 DIAGNOSIS — M7712 Lateral epicondylitis, left elbow: Secondary | ICD-10-CM

## 2020-09-22 DIAGNOSIS — M7711 Lateral epicondylitis, right elbow: Secondary | ICD-10-CM | POA: Diagnosis not present

## 2020-09-22 MED ORDER — DICLOFENAC SODIUM 1 % EX GEL
2.0000 g | Freq: Four times a day (QID) | CUTANEOUS | 1 refills | Status: DC
  Filled 2020-09-22: qty 100, 12d supply, fill #0

## 2020-09-22 NOTE — Progress Notes (Signed)
    Procedures performed today:    None.  Independent interpretation of notes and tests performed by another provider:   None.  Brief History, Exam, Impression, and Recommendations:    Julie Yang is a 50 year old female presenting for evaluation of bilateral elbow pain.  Elbow pain: Reports that symptoms began about 3 months ago.  Inciting event at that time seem to be playing basketball.  Pain is primarily over lateral epicondyle and extends some slightly posteriorly.  Pain has fluctuated since it began, worse with certain activities.  Pain is worsened with picking things up while wrist is in pronation.  Left tends to be more painful than right.  Patient is right-handed.  She denies any numbness or tingling.  No significant prior issues with either elbow.  Does report allergy to anti-inflammatories and steroids, however she has been able to use topical Voltaren gel without reactions.  BP 100/70   Pulse 77   LMP 09/07/2020   SpO2 99%   Pleasant 50 year old female in no acute distress Bilateral elbow: Tenderness to palpation over lateral epicondyles bilaterally, no significant swelling or erythema noted Normal range of motion for flexion, extension, pronation and supination Normal strength for flexion and extension Pain elicited with resisted extension of middle finger with elbows in extension Distal neurovascular exam intact  Lateral epicondylitis of both elbows Suspect lateral epicondylitis as underlying cause of symptoms Discussed diagnosis, prognosis, treatment options We will proceed with referral to physical therapy, advised on home exercise program as per PT Discussed avoidance of aggravating factors, relative rest, change in mechanics, particularly lifting items with wrist in supination as opposed to pronation Discussed options of epicondylitis brace and proper location of this Despite having allergy to anti-inflammatories, patient tolerated topical NSAID, will provide  prescription for this, instructed on use Discussed possibility of dry needling or PRP for refractory symptoms, continue to monitor Plan for follow-up in about 6 weeks or sooner as needed If not improving expected, consider imaging    ___________________________________________ Julie Silverio de [place], MD, ABFM, CAQSM [facility]

## 2020-09-22 NOTE — Patient Instructions (Addendum)
  Medication Instructions:  Your physician has recommended you make the following change in your medication:  -- START Voltaren Topical Gel/Ointment - Apply topically to affected areas as needed for pain --If you need a refill on any your medications before your next appointment, please call your pharmacy first. If no refills are authorized on file call the office.--  Referrals/Procedures/Imaging: A referral has been placed for you to go to Outpatient Physical Therapy at [facility]. Someone from the scheduling department will be in contact with you in regards to coordinating your consultation. If you do not hear from any of the schedulers within 7-10 business days please give our office a call.  [facility] on the 1st Floor Hours of Operation: Monday - Friday 8:00 am - 5:00 pm Closed on weekends and all major holidays (P) [phone]  Follow-Up: Your next appointment:   Your physician recommends that you schedule a follow-up appointment in: 41 WEEKS with Dr. de [place]      Thanks for letting us be apart of your health journey!!        [facility]   Dr. Raymond de [place] II., MD, MPH               We recommend signing up for the patient portal called "MyChart".  Sign up information is provided on this After Visit Summary.  MyChart is used to connect with patients for Virtual Visits (Telemedicine).  Patients are able to view lab/test results, encounter notes, upcoming appointments, etc.  Non-urgent messages can be sent to your provider as well.   To learn more about what you can do with MyChart, please visit --  [email or web address].

## 2020-09-22 NOTE — Assessment & Plan Note (Signed)
Suspect lateral epicondylitis as underlying cause of symptoms Discussed diagnosis, prognosis, treatment options We will proceed with referral to physical therapy, advised on home exercise program as per PT Discussed avoidance of aggravating factors, relative rest, change in mechanics, particularly lifting items with wrist in supination as opposed to pronation Discussed options of epicondylitis brace and proper location of this Despite having allergy to anti-inflammatories, patient tolerated topical NSAID, will provide prescription for this, instructed on use Discussed possibility of dry needling or PRP for refractory symptoms, continue to monitor Plan for follow-up in about 6 weeks or sooner as needed If not improving expected, consider imaging

## 2020-09-27 ENCOUNTER — Ambulatory Visit (INDEPENDENT_AMBULATORY_CARE_PROVIDER_SITE_OTHER): Payer: 59 | Admitting: Professional

## 2020-09-27 DIAGNOSIS — F4323 Adjustment disorder with mixed anxiety and depressed mood: Secondary | ICD-10-CM

## 2020-10-18 ENCOUNTER — Ambulatory Visit (INDEPENDENT_AMBULATORY_CARE_PROVIDER_SITE_OTHER): Payer: 59 | Admitting: Professional

## 2020-10-18 DIAGNOSIS — F4323 Adjustment disorder with mixed anxiety and depressed mood: Secondary | ICD-10-CM | POA: Diagnosis not present

## 2020-11-01 ENCOUNTER — Other Ambulatory Visit: Payer: Self-pay

## 2020-11-01 ENCOUNTER — Other Ambulatory Visit (HOSPITAL_COMMUNITY): Payer: Self-pay

## 2020-11-01 ENCOUNTER — Encounter: Payer: Self-pay | Admitting: Family

## 2020-11-01 ENCOUNTER — Ambulatory Visit: Payer: 59 | Admitting: Family

## 2020-11-01 VITALS — BP 119/76 | HR 68 | Temp 98.2°F | Ht 62.5 in | Wt 189.4 lb

## 2020-11-01 DIAGNOSIS — H60331 Swimmer's ear, right ear: Secondary | ICD-10-CM

## 2020-11-01 DIAGNOSIS — L309 Dermatitis, unspecified: Secondary | ICD-10-CM | POA: Diagnosis not present

## 2020-11-01 MED ORDER — TRIAMCINOLONE ACETONIDE 0.1 % EX CREA
1.0000 "application " | TOPICAL_CREAM | Freq: Two times a day (BID) | CUTANEOUS | 0 refills | Status: DC
  Filled 2020-11-01: qty 30, 15d supply, fill #0

## 2020-11-01 MED ORDER — NEOMYCIN-POLYMYXIN-HC 3.5-10000-1 OT SOLN
3.0000 [drp] | Freq: Four times a day (QID) | OTIC | 0 refills | Status: DC
  Filled 2020-11-01: qty 10, 16d supply, fill #0

## 2020-11-01 NOTE — Progress Notes (Signed)
Acute Office Visit  Subjective:    Patient ID: Julie Yang, female    DOB: 1970/11/23, 50 y.o.   MRN: [id number]  Chief Complaint  Patient presents with  . Ear Pain  . Eczema    On Rt hand     HPI Patient is in today with complaints of right ear pain after being at the beach this past weekend.  Denies any drainage or discharge.  Pain is worse when she presses her tragus.  Denies any sneezing, cough, or congestion.  Patient also has concerns of a rash that occurs to her right hand every summer that she attributes to using sunscreen.  Triamcinolone cream typically helps the rash and it goes away.  Does not have it any other time of the year.  It is dry, itchy and flaky.  Past Medical History:  Diagnosis Date  . Abnormal Pap smear of cervix    1990s--hx of cryotherapy to cervix  . Anxiety   . COVID-19 virus infection 10/2019  . Genital warts   . Thyroid disease 2013  . Urticaria    chronic autoimmune urticaria    Past Surgical History:  Procedure Laterality Date  . CESAREAN SECTION    . CYST EXCISION     from chest, benign    Family History  Problem Relation Age of Onset  . Heart disease Father   . Hypertension Father   . Hyperlipidemia Father   . Eczema Father   . Heart disease Maternal Grandmother   . Diabetes Maternal Grandmother   . Breast cancer Maternal Grandmother 80  . Hypertension Maternal Grandmother   . Stroke Maternal Grandmother   . Heart disease Maternal Grandfather   . Lung cancer Maternal Grandfather   . Colon cancer Maternal Grandfather 6  . Brain cancer Maternal Grandfather   . Hypertension Maternal Grandfather   . Heart attack Maternal Grandfather   . Heart disease Paternal Grandmother   . Hypertension Paternal Grandmother   . Heart disease Paternal Grandfather   . Hypertension Paternal Grandfather   . Heart attack Paternal Grandfather   . Crohn's disease Brother   . Colon cancer Maternal Uncle 42  . Atrial fibrillation Mother   .  Allergic rhinitis Neg Hx   . Angioedema Neg Hx   . Asthma Neg Hx   . Immunodeficiency Neg Hx   . Urticaria Neg Hx     Social History   Socioeconomic History  . Marital status: Married    Spouse name: Not on file  . Number of children: Not on file  . Years of education: Not on file  . Highest education level: Not on file  Occupational History  . Not on file  Tobacco Use  . Smoking status: Never  . Smokeless tobacco: Never  Vaping Use  . Vaping Use: Never used  Substance and Sexual Activity  . Alcohol use: Yes    Comment: 3 a month  . Drug use: No  . Sexual activity: Yes    Partners: Male    Birth control/protection: Other-see comments    Comment: spouse had vasectomy  Other Topics Concern  . 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 Cholecalciferol (VITAMIN D) 2000 units CAPS Take 1 capsule by mouth daily.    . diclofenac Sodium (VOLTAREN) 1 % GEL Apply 2 g topically 4 (four) times daily to the affected joint. 100 g 1  . hydrOXYzine (ATARAX/VISTARIL) 25 MG tablet Take 1 tablet (25 mg total) by mouth 3 (three) times daily as needed for anxiety. 60 tablet 0  . Probiotic Product (PROBIOTIC ADVANCED PO) Take 1 capsule by mouth daily.    Marland Kitchen SYNTHROID 75 MCG tablet TAKE 1 TABLET (75 MCG TOTAL) BY MOUTH DAILY BEFORE BREAKFAST. 90 tablet 3   No facility-administered medications prior to visit.    Allergies  Allergen Reactions  . Cortisone Hives  . Prednisone Hives  . Aspirin Hives    Exacerbates hives  . Ibuprofen Hives    Exacerbates hives  .  Iodine Hives  . Povidone Iodine Rash    Review of Systems  HENT:  Positive for ear pain. Negative for ear discharge, sinus pressure and sinus pain.   Cardiovascular: Negative.   Endocrine: Negative.   Musculoskeletal: Negative.   Skin:  Positive for rash.       Right hand, dry itchy rash  Allergic/Immunologic: Negative.   Neurological: Negative.   Psychiatric/Behavioral: Negative.    All other systems reviewed and are negative.     Objective:    Physical Exam  BP 119/76   Pulse 68   Temp 98.2 F (36.8 C) (Temporal)   Ht 5' 2.5" (1.588 m)   Wt 189 lb 6.4 oz (85.9 kg)   LMP 10/30/2020   SpO2 99%   BMI 34.09 kg/m  Wt Readings from Last 3 Encounters:  11/01/20 189 lb 6.4 oz (85.9 kg)  01/04/20 174 lb (78.9 kg)  12/28/19 171 lb 3.2 oz (77.7 kg)    Health Maintenance Due  Topic Date Due  . Pneumococcal Vaccine 102-13 Years old (1 - PCV) Never done  . COVID-19 Vaccine (4 - Booster for Pfizer series) 05/20/2020    There are no preventive care reminders to display for this patient.   Lab Results  Component Value Date   TSH 2.85 01/04/2020   Lab Results  Component Value Date   WBC 9.0 10/28/2019   HGB 13.4 10/28/2019   HCT 39.8 10/28/2019   MCV 95.4 10/28/2019   PLT 304.0 10/28/2019   Lab Results  Component Value Date   NA 139 10/28/2019   K 3.9 10/28/2019   CO2 27 10/28/2019   GLUCOSE 90 10/28/2019   BUN 17 10/28/2019   CREATININE 0.69 10/28/2019   BILITOT 0.4 10/28/2019   ALKPHOS 49 10/28/2019   AST 15 10/28/2019   ALT 9 10/28/2019   PROT 7.0 10/28/2019   ALBUMIN 4.3 10/28/2019   CALCIUM 9.2 10/28/2019   ANIONGAP 6 09/19/2015   GFR 91.39 10/28/2019   Lab Results  Component Value Date   CHOL 155 11/05/2019   Lab Results  Component Value Date   HDL 54.20 11/05/2019   Lab Results  Component Value Date   LDLCALC 86 11/05/2019   Lab Results  Component Value Date   TRIG 77.0 11/05/2019   Lab Results  Component Value Date   CHOLHDL 3  11/05/2019   No results found for: HGBA1C     Assessment & Plan:   Problem List Items Addressed This Visit   None Visit  Diagnoses     Acute swimmer's ear of right side    -  Primary   Eczema, unspecified type            Meds ordered this encounter  Medications  . neomycin-polymyxin-hydrocortisone (CORTISPORIN) OTIC solution    Sig: Place 3 drops into the right ear 4 (four) times daily.    Dispense:  10 mL    Refill:  0  . triamcinolone cream (KENALOG) 0.1 %    Sig: Apply 1 application topically 2 (two) times daily.    Dispense:  30 g    Refill:  0   Plan: Call the office with any questions or concerns.  Follow-up as scheduled and sooner as needed.  Kennyth [facility], FNP

## 2020-11-03 ENCOUNTER — Ambulatory Visit (HOSPITAL_BASED_OUTPATIENT_CLINIC_OR_DEPARTMENT_OTHER): Payer: 59 | Admitting: Family Medicine

## 2020-11-08 ENCOUNTER — Ambulatory Visit (INDEPENDENT_AMBULATORY_CARE_PROVIDER_SITE_OTHER): Payer: 59 | Admitting: Professional

## 2020-11-08 DIAGNOSIS — F4323 Adjustment disorder with mixed anxiety and depressed mood: Secondary | ICD-10-CM | POA: Diagnosis not present

## 2020-11-29 ENCOUNTER — Ambulatory Visit (INDEPENDENT_AMBULATORY_CARE_PROVIDER_SITE_OTHER): Payer: 59 | Admitting: Professional

## 2020-11-29 DIAGNOSIS — F4323 Adjustment disorder with mixed anxiety and depressed mood: Secondary | ICD-10-CM

## 2020-12-18 ENCOUNTER — Other Ambulatory Visit (HOSPITAL_COMMUNITY): Payer: Self-pay

## 2020-12-18 MED FILL — Levothyroxine Sodium Tab 75 MCG: ORAL | 90 days supply | Qty: 90 | Fill #1 | Status: AC

## 2020-12-27 ENCOUNTER — Ambulatory Visit (INDEPENDENT_AMBULATORY_CARE_PROVIDER_SITE_OTHER): Payer: 59 | Admitting: Professional

## 2020-12-27 DIAGNOSIS — F4323 Adjustment disorder with mixed anxiety and depressed mood: Secondary | ICD-10-CM

## 2021-01-02 ENCOUNTER — Other Ambulatory Visit: Payer: Self-pay

## 2021-01-02 ENCOUNTER — Encounter: Payer: Self-pay | Admitting: Obstetrics and Gynecology

## 2021-01-02 ENCOUNTER — Ambulatory Visit (INDEPENDENT_AMBULATORY_CARE_PROVIDER_SITE_OTHER): Payer: 59 | Admitting: Obstetrics and Gynecology

## 2021-01-02 ENCOUNTER — Ambulatory Visit: Payer: 59 | Admitting: Obstetrics and Gynecology

## 2021-01-02 VITALS — BP 120/78 | HR 75 | Ht 63.0 in | Wt 195.0 lb

## 2021-01-02 DIAGNOSIS — Z01419 Encounter for gynecological examination (general) (routine) without abnormal findings: Secondary | ICD-10-CM

## 2021-01-02 NOTE — Progress Notes (Signed)
50 y.o. [id number] Married Caucasian female here for annual exam.    Menses are regular.   PCP:  Inda Coke, PA   Patient's last menstrual period was 12/20/2020 (exact date).     Period Cycle (Days): 30 Period Duration (Days): 4 Period Pattern: Regular Menstrual Flow:  (heavy first day then light) Menstrual Control: Tampon, Maxi pad Menstrual Control Change Freq (Hours): changes pad every 4 hours on heaviest day Dysmenorrhea: (!) Mild Dysmenorrhea Symptoms: Cramping     Sexually active: Yes.    The current method of family planning is vasectomy.    Exercising: Yes.     Walks 30 min.to 1 hr.daily and weights 2-3d/week Smoker:  no  Health Maintenance: Pap: 12-28-19 Neg:Neg HR HPV, 11-14-16 neg, 10-26-15 Neg:Neg HR HPV History of abnormal Pap:  yes, Hx of abnormal pap in 1990's--Hx HPV and possible cryotherapy MMG: 09-14-20 3D/Neg/BiRads1 Colonoscopy:  12-23-17 polyps;next 11/2022 BMD:   n/a  Result  n/a TDaP:  05-31-15 Gardasil:   no HIV: 11-05-19 NR Hep C: 2017 Neg Screening Labs:  PCP.   reports that she has never smoked. She has never used smokeless tobacco. She reports current alcohol use. She reports that she does not use drugs.  Past Medical History:  Diagnosis Date   Abnormal Pap smear of cervix    1990s--hx of cryotherapy to cervix   Anxiety    COVID-19 virus infection 10/2019   Genital warts    Thyroid disease 2013   Urticaria    chronic autoimmune urticaria    Past Surgical History:  Procedure Laterality Date   CESAREAN SECTION     CYST EXCISION     from chest, benign    Current Outpatient Medications  Medication Sig Dispense Refill   Cholecalciferol (VITAMIN D) 2000 units CAPS Take 1 capsule by mouth daily.     diclofenac Sodium (VOLTAREN) 1 % GEL Apply 2 g topically 4 (four) times daily to the affected joint. 100 g 1   hydrOXYzine (ATARAX/VISTARIL) 25 MG tablet Take 1 tablet (25 mg total) by mouth 3 (three) times daily as needed for anxiety. 60 tablet 0    Probiotic Product (PROBIOTIC ADVANCED PO) Take 1 capsule by mouth daily.     SYNTHROID 75 MCG tablet TAKE 1 TABLET (75 MCG TOTAL) BY MOUTH DAILY BEFORE BREAKFAST. 90 tablet 3   triamcinolone cream (KENALOG) 0.1 % Apply to affected areas 2 (two) times daily. 30 g 0   No current facility-administered medications for this visit.    Family History  Problem Relation Age of Onset   Heart disease Father    Hypertension Father    Hyperlipidemia Father    Eczema Father    Heart disease Maternal Grandmother    Diabetes Maternal Grandmother    Breast cancer Maternal Grandmother 17   Hypertension Maternal Grandmother    Stroke Maternal Grandmother    Heart disease Maternal Grandfather    Lung cancer Maternal Grandfather    Colon cancer Maternal Grandfather 30   Brain cancer Maternal Grandfather    Hypertension Maternal Grandfather    Heart attack Maternal Grandfather    Heart disease Paternal Grandmother    Hypertension Paternal Grandmother    Heart disease Paternal Grandfather    Hypertension Paternal Grandfather    Heart attack Paternal Grandfather    Crohn's disease Brother    Colon cancer Maternal Uncle 16   Atrial fibrillation Mother    Allergic rhinitis Neg Hx    Angioedema Neg Hx    Asthma Neg  Hx    Immunodeficiency Neg Hx    Urticaria Neg Hx     Review of Systems  All other systems reviewed and are negative.  Exam:   BP 120/78   Pulse 75   Ht '5\' 3"'$  (1.6 m)   Wt 195 lb (88.5 kg)   LMP 12/20/2020 (Exact Date)   SpO2 99%   BMI 34.54 kg/m     General appearance: alert, cooperative and appears stated age Head: normocephalic, without obvious abnormality, atraumatic Neck: no adenopathy, supple, symmetrical, trachea midline and thyroid normal to inspection and palpation Lungs: clear to auscultation bilaterally Breasts: normal appearance, no masses or tenderness, No nipple retraction or dimpling, No nipple discharge or bleeding, No axillary adenopathy Heart: regular rate  and rhythm Abdomen: soft, non-tender; no masses, no organomegaly Extremities: extremities normal, atraumatic, no cyanosis or edema Skin: skin color, texture, turgor normal. No rashes or lesions Lymph nodes: cervical, supraclavicular, and axillary nodes normal. Neurologic: grossly normal  Pelvic: External genitalia:  no lesions              No abnormal inguinal nodes palpated.              Urethra:  normal appearing urethra with no masses, tenderness or lesions              Bartholins and Skenes: normal                 Vagina: normal appearing vagina with normal color and discharge, no lesions              Cervix: no lesions              Pap taken: no. Bimanual Exam:  Uterus:  normal size, contour, position, consistency, mobility, non-tender              Adnexa: no mass, fullness, tenderness              Rectal exam: yes.  Confirms.              Anus:  normal sphincter tone, no lesions  Chaperone was present for exam:  Yes.  Assessment:   Well woman visit with gynecologic exam. Hypothyroidism.   Plan: Mammogram screening discussed. Self breast awareness reviewed. Pap and HR HPV as above. Guidelines for Calcium, Vitamin D, regular exercise program including cardiovascular and weight bearing exercise. Labs with PCP.  Follow up annually and prn.   After visit summary provided.

## 2021-01-02 NOTE — Patient Instructions (Signed)

## 2021-01-16 ENCOUNTER — Ambulatory Visit: Payer: 59 | Admitting: Professional

## 2021-02-06 ENCOUNTER — Ambulatory Visit (INDEPENDENT_AMBULATORY_CARE_PROVIDER_SITE_OTHER): Payer: 59 | Admitting: Professional

## 2021-02-06 DIAGNOSIS — F4323 Adjustment disorder with mixed anxiety and depressed mood: Secondary | ICD-10-CM

## 2021-03-15 ENCOUNTER — Ambulatory Visit (INDEPENDENT_AMBULATORY_CARE_PROVIDER_SITE_OTHER): Payer: 59 | Admitting: Professional

## 2021-03-15 DIAGNOSIS — F4323 Adjustment disorder with mixed anxiety and depressed mood: Secondary | ICD-10-CM | POA: Diagnosis not present

## 2021-03-26 ENCOUNTER — Other Ambulatory Visit (HOSPITAL_COMMUNITY): Payer: Self-pay

## 2021-03-26 ENCOUNTER — Other Ambulatory Visit: Payer: Self-pay | Admitting: Physician Assistant

## 2021-03-26 DIAGNOSIS — E039 Hypothyroidism, unspecified: Secondary | ICD-10-CM

## 2021-03-26 MED ORDER — SYNTHROID 75 MCG PO TABS
ORAL_TABLET | ORAL | 1 refills | Status: DC
  Filled 2021-03-26: qty 90, 90d supply, fill #0
  Filled 2021-07-17: qty 90, 90d supply, fill #1

## 2021-03-30 ENCOUNTER — Ambulatory Visit: Payer: 59 | Admitting: Podiatry

## 2021-03-30 ENCOUNTER — Ambulatory Visit (INDEPENDENT_AMBULATORY_CARE_PROVIDER_SITE_OTHER): Payer: 59

## 2021-03-30 ENCOUNTER — Other Ambulatory Visit: Payer: Self-pay

## 2021-03-30 DIAGNOSIS — M7750 Other enthesopathy of unspecified foot: Secondary | ICD-10-CM | POA: Diagnosis not present

## 2021-03-30 DIAGNOSIS — M722 Plantar fascial fibromatosis: Secondary | ICD-10-CM

## 2021-04-02 NOTE — Progress Notes (Signed)
Subjective:   Patient ID: Julie Yang, female   DOB: 50 y.o.   MRN: [id number]   HPI 50 year old female presents the office today for concerns of discomfort of the ball of her left foot.  She states this feels different than the Planter fasciitis discomfort.  She points on the plantar midfoot aspect and one specific area where she gets discomfort.  She did do the Planter fasciitis exercises without significant improvement to this area.  She left her feet a lot for work which aggravates her symptoms.  No injury that she reports.   Review of Systems  All other systems reviewed and are negative.  Past Medical History:  Diagnosis Date   Abnormal Pap smear of cervix    1990s--hx of cryotherapy to cervix   Anxiety    COVID-19 virus infection 10/2019   Genital warts    Thyroid disease 2013   Urticaria    chronic autoimmune urticaria    Past Surgical History:  Procedure Laterality Date   CESAREAN SECTION     CYST EXCISION     from chest, benign     Current Outpatient Medications:    Cholecalciferol (VITAMIN D) 2000 units CAPS, Take 1 capsule by mouth daily., Disp: , Rfl:    diclofenac Sodium (VOLTAREN) 1 % GEL, Apply 2 g topically 4 (four) times daily to the affected joint., Disp: 100 g, Rfl: 1   hydrOXYzine (ATARAX/VISTARIL) 25 MG tablet, Take 1 tablet (25 mg total) by mouth 3 (three) times daily as needed for anxiety., Disp: 60 tablet, Rfl: 0   Probiotic Product (PROBIOTIC ADVANCED PO), Take 1 capsule by mouth daily., Disp: , Rfl:    SYNTHROID 75 MCG tablet, TAKE 1 TABLET (75 MCG TOTAL) BY MOUTH DAILY BEFORE BREAKFAST., Disp: 90 tablet, Rfl: 1   triamcinolone cream (KENALOG) 0.1 %, Apply to affected areas 2 (two) times daily., Disp: 30 g, Rfl: 0  Allergies  Allergen Reactions   Cortisone Hives   Prednisone Hives   Aspirin Hives    Exacerbates hives   Ibuprofen Hives    Exacerbates hives   Iodine Hives   Povidone Iodine Rash          Objective:  Physical Exam   General: AAO x3, NAD  Dermatological: Skin is warm, dry and supple bilateral. There are no open sores, no preulcerative lesions, no rash or signs of infection present.  Vascular: Dorsalis Pedis artery and Posterior Tibial artery pedal pulses are 2/4 bilateral with immedate capillary fill time. There is no pain with calf compression, swelling, warmth, erythema.   Neruologic: Grossly intact via light touch bilateral.  No Tinel sign.  Musculoskeletal: The majority discomfort is along the plantar midfoot and appears to be underneath the navicular tuberosity.  There is no pain on course or insertion of the posterior tibial tendon on the medial aspect or along the ankle.  No significant discomfort plantar fascial today otherwise.  Muscular strength 5/5 in all groups tested bilateral.  Gait: Unassisted, Nonantalgic.       Assessment:   Plantar fasciitis, tendinitis left side     Plan:  -Treatment options discussed including all alternatives, risks, and complications -Etiology of symptoms were discussed -X-rays were obtained and reviewed with the patient.  There is no evidence of acute fracture or stress fracture identified today. -I think part of this could be the plantar fasciitis however I think it is possible from the insertion of the posterior tibial tendon causing some of her discomfort.  We discussed continue  shoes and good arch support.  Referred to physical therapy as well.  Trula Slade DPM

## 2021-04-20 ENCOUNTER — Encounter: Payer: 59 | Admitting: Physician Assistant

## 2021-05-09 NOTE — Progress Notes (Incomplete)
Julie Yang is a 51 y.o. female here for a follow up referred by former PCP, Dr. Rogers Blocker.  SCRIBE STATEMENT  History of Present Illness:   No chief complaint on file.   HPI  Hypothyroidism Julie Yang is compliant with taking synthroid 75 mcg daily with no adverse effects. She is managing well at this time.   Anxiety Pt was previously on hydroxyzine 25 mg three times daily as needed, which she found ***. At this time she is ***. Denies ***.   Vitamin D Deficiency [name] is currently compliant with taking 2000 units of OTC vitamin D daily with no adverse effects. She is managing well at this time. ***  Past Medical History:  Diagnosis Date   Abnormal Pap smear of cervix    1990s--hx of cryotherapy to cervix   Anxiety    COVID-19 virus infection 10/2019   Genital warts    Thyroid disease 2013   Urticaria    chronic autoimmune urticaria     Social History   Tobacco Use   Smoking status: Never   Smokeless tobacco: Never  Vaping Use   Vaping Use: Never used  Substance Use Topics   Alcohol use: Yes    Comment: 3drinks/ month   Drug use: No    Past Surgical History:  Procedure Laterality Date   CESAREAN SECTION     CYST EXCISION     from chest, benign    Family History  Problem Relation Age of Onset   Heart disease Father    Hypertension Father    Hyperlipidemia Father    Eczema Father    Heart disease Maternal Grandmother    Diabetes Maternal Grandmother    Breast cancer Maternal Grandmother 39   Hypertension Maternal Grandmother    Stroke Maternal Grandmother    Heart disease Maternal Grandfather    Lung cancer Maternal Grandfather    Colon cancer Maternal Grandfather 13   Brain cancer Maternal Grandfather    Hypertension Maternal Grandfather    Heart attack Maternal Grandfather    Heart disease Paternal Grandmother    Hypertension Paternal Grandmother    Heart disease Paternal Grandfather    Hypertension Paternal Grandfather    Heart attack Paternal  Grandfather    Crohn's disease Brother    Colon cancer Maternal Uncle 53   Atrial fibrillation Mother    Allergic rhinitis Neg Hx    Angioedema Neg Hx    Asthma Neg Hx    Immunodeficiency Neg Hx    Urticaria Neg Hx     Allergies  Allergen Reactions   Cortisone Hives   Prednisone Hives   Aspirin Hives    Exacerbates hives   Ibuprofen Hives    Exacerbates hives   Iodine Hives   Povidone Iodine Rash    Current Medications:   Current Outpatient Medications:    Cholecalciferol (VITAMIN D) 2000 units CAPS, Take 1 capsule by mouth daily., Disp: , Rfl:    diclofenac Sodium (VOLTAREN) 1 % GEL, Apply 2 g topically 4 (four) times daily to the affected joint., Disp: 100 g, Rfl: 1   hydrOXYzine (ATARAX/VISTARIL) 25 MG tablet, Take 1 tablet (25 mg total) by mouth 3 (three) times daily as needed for anxiety., Disp: 60 tablet, Rfl: 0   Probiotic Product (PROBIOTIC ADVANCED PO), Take 1 capsule by mouth daily., Disp: , Rfl:    SYNTHROID 75 MCG tablet, TAKE 1 TABLET (75 MCG TOTAL) BY MOUTH DAILY BEFORE BREAKFAST., Disp: 90 tablet, Rfl: 1   triamcinolone cream (KENALOG) 0.1 %,  Apply to affected areas 2 (two) times daily., Disp: 30 g, Rfl: 0   Review of Systems:   ROS Negative unless otherwise specified per HPI. Vitals:   There were no vitals filed for this visit.   There is no height or weight on file to calculate BMI.  Physical Exam:   Physical Exam  Assessment and Plan:        I,Havlyn C Ratchford,acting as a scribe for Inda Coke, PA.,have documented all relevant documentation on the behalf of Inda Coke, PA,as directed by  Inda Coke, PA while in the presence of Inda Coke, [place].  ***  Inda Coke, PA-C

## 2021-05-11 ENCOUNTER — Encounter: Payer: Self-pay | Admitting: Physician Assistant

## 2021-05-11 ENCOUNTER — Other Ambulatory Visit: Payer: Self-pay

## 2021-05-11 ENCOUNTER — Ambulatory Visit: Payer: 59 | Admitting: Physician Assistant

## 2021-05-11 VITALS — BP 120/74 | HR 73 | Temp 98.1°F | Ht 63.0 in | Wt 195.0 lb

## 2021-05-11 DIAGNOSIS — Z13220 Encounter for screening for lipoid disorders: Secondary | ICD-10-CM | POA: Diagnosis not present

## 2021-05-11 DIAGNOSIS — Z136 Encounter for screening for cardiovascular disorders: Secondary | ICD-10-CM | POA: Diagnosis not present

## 2021-05-11 DIAGNOSIS — Z0000 Encounter for general adult medical examination without abnormal findings: Secondary | ICD-10-CM | POA: Diagnosis not present

## 2021-05-11 DIAGNOSIS — F418 Other specified anxiety disorders: Secondary | ICD-10-CM

## 2021-05-11 DIAGNOSIS — E039 Hypothyroidism, unspecified: Secondary | ICD-10-CM | POA: Diagnosis not present

## 2021-05-11 DIAGNOSIS — E669 Obesity, unspecified: Secondary | ICD-10-CM

## 2021-05-11 LAB — COMPREHENSIVE METABOLIC PANEL
ALT: 17 U/L (ref 0–35)
AST: 18 U/L (ref 0–37)
Albumin: 4.4 g/dL (ref 3.5–5.2)
Alkaline Phosphatase: 53 U/L (ref 39–117)
BUN: 13 mg/dL (ref 6–23)
CO2: 26 mEq/L (ref 19–32)
Calcium: 9.4 mg/dL (ref 8.4–10.5)
Chloride: 105 mEq/L (ref 96–112)
Creatinine, Ser: 0.73 mg/dL (ref 0.40–1.20)
GFR: 97.51 mL/min (ref >60.00)
Glucose, Bld: 81 mg/dL (ref 70–99)
Potassium: 5 mEq/L (ref 3.5–5.1)
Sodium: 137 mEq/L (ref 135–145)
Total Bilirubin: 0.6 mg/dL (ref 0.2–1.2)
Total Protein: 7.5 g/dL (ref 6.0–8.3)

## 2021-05-11 LAB — CBC WITH DIFFERENTIAL/PLATELET
Basophils Absolute: 0 10*3/uL (ref 0.0–0.1)
Basophils Relative: 0.7 % (ref 0.0–3.0)
Eosinophils Absolute: 0.2 10*3/uL (ref 0.0–0.7)
Eosinophils Relative: 3.4 % (ref 0.0–5.0)
HCT: 45.1 % (ref 36.0–46.0)
Hemoglobin: 14.9 g/dL (ref 12.0–15.0)
Lymphocytes Relative: 30.4 % (ref 12.0–46.0)
Lymphs Abs: 2.1 10*3/uL (ref 0.7–4.0)
MCHC: 33 g/dL (ref 30.0–36.0)
MCV: 95 fl (ref 78.0–100.0)
Monocytes Absolute: 0.7 10*3/uL (ref 0.1–1.0)
Monocytes Relative: 9.3 % (ref 3.0–12.0)
Neutro Abs: 4 10*3/uL (ref 1.4–7.7)
Neutrophils Relative %: 56.2 % (ref 43.0–77.0)
Platelets: 343 10*3/uL (ref 150.0–400.0)
RBC: 4.74 Mil/uL (ref 3.87–5.11)
RDW: 13.1 % (ref 11.5–15.5)
WBC: 7 10*3/uL (ref 4.0–10.5)

## 2021-05-11 LAB — LIPID PANEL
Cholesterol: 166 mg/dL (ref 0–200)
HDL: 46.1 mg/dL (ref >39.00)
LDL Cholesterol: 107 mg/dL — ABNORMAL HIGH (ref 0–99)
NonHDL: 119.64
Total CHOL/HDL Ratio: 4
Triglycerides: 63 mg/dL (ref 0.0–149.0)
VLDL: 12.6 mg/dL (ref 0.0–40.0)

## 2021-05-11 LAB — HEMOGLOBIN A1C: Hgb A1c MFr Bld: 5.1 % (ref 4.6–6.5)

## 2021-05-11 LAB — TSH: TSH: 2.16 u[IU]/mL (ref 0.35–5.50)

## 2021-05-11 NOTE — Patient Instructions (Signed)
It was great to see you! ? ?Please go to the lab for blood work.  ? ?Our office will call you with your results unless you have chosen to receive results via MyChart. ? ?If your blood work is normal we will follow-up each year for physicals and as scheduled for chronic medical problems. ? ?If anything is abnormal we will treat accordingly and get you in for a follow-up. ? ?Take care, ? ?Samiya Mervin ?  ? ? ?

## 2021-05-11 NOTE — Progress Notes (Signed)
Subjective:    Julie Yang is a 51 y.o. female and is h2ere for a comprehensive physical exam referred by former PCP, Dr. Rogers Blocker.    HPI  Health Maintenance Due  Topic Date Due   Pneumococcal Vaccine 83-54 Years old (1 - PCV) Never done   COVID-19 Vaccine (4 - Booster for Pfizer series) 04/14/2020    Acute Concerns: None  Chronic Issues: Hypothyroidism Julie Yang is compliant with taking synthroid 75 mcg daily with no adverse effects. Currently she has noticed increase fatigue and believes it could be due to this issue. Despite this she is managing well. Denies heart palpitation, hair loss, or unintentional weight changes.   Anxiety Pt is compliant with taking hydroxyzine 25 mg three times daily as needed, which she found she rarely takes. States that she experiences more situational anxiety especially when at work, but mood is overall controlled. At this time she is managing well. Denies SI/HI.  Vitamin D Deficiency Julie Yang is currently compliant with taking 2000 units of OTC vitamin D daily with no adverse effects. She is managing well at this time.   Health Maintenance: Immunizations -- Covid-UTD Influenza-UTD Tdap-UTD;2017 Colonoscopy -- MBT;5974 Mammogram -- UTD PAP -- UTD;2021 Bone Density -- N/A Diet -- Eats all foods  Sleep habits -- Normal schedule Dentistry- UTD Ophthalmology- UTD Exercise -- Walks 30-45 minutes daily, yoga and strength training 3x week Weight -- Stable Mood -- Stable Weight history: Wt Readings from Last 10 Encounters:  05/11/21 195 lb (88.5 kg)  01/02/21 195 lb (88.5 kg)  11/01/20 189 lb 6.4 oz (85.9 kg)  01/04/20 174 lb (78.9 kg)  12/28/19 171 lb 3.2 oz (77.7 kg)  11/05/19 168 lb (76.2 kg)  10/28/19 168 lb (76.2 kg)  07/21/19 161 lb 8 oz (73.3 kg)  07/02/19 161 lb 4 oz (73.1 kg)  05/17/19 164 lb (74.4 kg)   Body mass index is 34.54 kg/m. Patient's last menstrual period was 04/04/2021. Alcohol use:  reports current alcohol  use. Tobacco use:  Tobacco Use: Low Risk    Smoking Tobacco Use: Never   Smokeless Tobacco Use: Never   Passive Exposure: Not on file     Depression screen [facility] 2/9 05/11/2021  Decreased Interest 0  Down, Depressed, Hopeless 0  PHQ - 2 Score 0  Altered sleeping -  Tired, decreased energy -  Change in appetite -  Feeling bad or failure about yourself  -  Trouble concentrating -  Moving slowly or fidgety/restless -  Suicidal thoughts -  PHQ-9 Score -  Difficult doing work/chores -     Other providers/specialists: Patient Care Team: Inda Coke, [place] as PCP - General (Physician Assistant) Regina Eck, CNM as Referring Physician (Certified Nurse Midwife) Nunzio Cobbs, MD as Consulting Physician (Obstetrics and Gynecology)    PMHx, SurgHx, SocialHx, Medications, and Allergies were reviewed in the Visit Navigator and updated as appropriate.   Past Medical History:  Diagnosis Date   Abnormal Pap smear of cervix    1990s--hx of cryotherapy to cervix   Anxiety    COVID-19 virus infection 10/2019   Genital warts    Thyroid disease 2013   Urticaria    chronic autoimmune urticaria     Past Surgical History:  Procedure Laterality Date   CESAREAN SECTION     CYST EXCISION     from chest, benign     Family History  Problem Relation Age of Onset   Heart disease Father    Hypertension  Father    Hyperlipidemia Father    Eczema Father    Heart disease Maternal Grandmother    Diabetes Maternal Grandmother    Breast cancer Maternal Grandmother 65   Hypertension Maternal Grandmother    Stroke Maternal Grandmother    Heart disease Maternal Grandfather    Lung cancer Maternal Grandfather    Colon cancer Maternal Grandfather 70   Brain cancer Maternal Grandfather    Hypertension Maternal Grandfather    Heart attack Maternal Grandfather    Heart disease Paternal Grandmother    Hypertension Paternal Grandmother    Heart disease Paternal Grandfather     Hypertension Paternal Grandfather    Heart attack Paternal Grandfather    Crohn's disease Brother    Colon cancer Maternal Uncle 74   Atrial fibrillation Mother    Allergic rhinitis Neg Hx    Angioedema Neg Hx    Asthma Neg Hx    Immunodeficiency Neg Hx    Urticaria Neg Hx     Social History   Tobacco Use   Smoking status: Never   Smokeless tobacco: Never  Vaping Use   Vaping Use: Never used  Substance Use Topics   Alcohol use: Yes    Comment: 3drinks/ month   Drug use: No    Review of Systems:   Review of Systems  Constitutional:  Negative for chills, fever, malaise/fatigue and weight loss.  HENT:  Negative for hearing loss, sinus pain and sore throat.   Respiratory:  Negative for cough and hemoptysis.   Cardiovascular:  Negative for chest pain, palpitations, leg swelling and PND.  Gastrointestinal:  Negative for abdominal pain, constipation, diarrhea, heartburn, nausea and vomiting.  Genitourinary:  Negative for dysuria, frequency and urgency.  Musculoskeletal:  Negative for back pain, myalgias and neck pain.  Skin:  Negative for itching and rash.  Neurological:  Negative for dizziness, tingling, seizures and headaches.  Endo/Heme/Allergies:  Negative for polydipsia.  Psychiatric/Behavioral:  Negative for depression. The patient is not nervous/anxious.     Objective:   BP 120/74 (BP Location: Left Arm, Patient Position: Sitting, Cuff Size: Normal)    Pulse 73    Temp 98.1 F (36.7 C) (Temporal)    Ht 5\' 3"  (1.6 m)    Wt 195 lb (88.5 kg)    LMP 04/04/2021    SpO2 97%    BMI 34.54 kg/m  Body mass index is 34.54 kg/m.   General Appearance:    Alert, cooperative, no distress, appears stated age  Head:    Normocephalic, without obvious abnormality, atraumatic  Eyes:    PERRL, conjunctiva/corneas clear, EOM's intact, fundi    benign, both eyes  Ears:    Normal TM's and external ear canals, both ears  Nose:   Nares normal, septum midline, mucosa normal, no  drainage    or sinus tenderness  Throat:   Lips, mucosa, and tongue normal; teeth and gums normal  Neck:   Supple, symmetrical, trachea midline, no adenopathy;    thyroid:  no enlargement/tenderness/nodules; no carotid   bruit or JVD  Back:     Symmetric, no curvature, ROM normal, no CVA tenderness  Lungs:     Clear to auscultation bilaterally, respirations unlabored  Chest Wall:    No tenderness or deformity   Heart:    Regular rate and rhythm, S1 and S2 normal, no murmur, rub or gallop  Breast Exam:    Deferred  Abdomen:     Soft, non-tender, bowel sounds active all four quadrants,  no masses, no organomegaly  Genitalia:    Deferred  Extremities:   Extremities normal, atraumatic, no cyanosis or edema  Pulses:   2+ and symmetric all extremities  Skin:   Skin color, texture, turgor normal, no rashes or lesions  Lymph nodes:   Cervical, supraclavicular, and axillary nodes normal  Neurologic:   CNII-XII intact, normal strength, sensation and reflexes    throughout    Assessment/Plan:   Routine Physical Examination Today patient counseled on age appropriate routine health concerns for screening and prevention, each reviewed and up to date or declined. Immunizations reviewed and up to date or declined. Labs ordered and reviewed. Risk factors for depression reviewed and negative. Hearing function and visual acuity are intact. ADLs screened and addressed as needed. Functional ability and level of safety reviewed and appropriate. Education, counseling and referrals performed based on assessed risks today. Patient provided with a copy of personalized plan for preventive services.  Situational Anxiety Controlled  Continue hydroxyzine 25 mg three times daily as needed Patient denies SI/HI I advised patient that if they develop any SI, to tell someone immediately and seek medical attention  Hypothyroidism, unspecified type  Update labs today, will adjust medication as indicated  -  TSH Continue synthroid 75 mcg daily  Follow up as needed  Obesity, unspecified classification, unspecified obesity type, unspecified whether serious comorbidity present Encouraged patient to continue participating in daily exercise and practice making healthier dietary choices    Encounter for lipid screening for cardiovascular disease Update labs today, will start medication as indicated by lab results  - Lipid Panel   Patient Counseling: [x]    Nutrition: Stressed importance of moderation in sodium/caffeine intake, saturated fat and cholesterol, caloric balance, sufficient intake of fresh fruits, vegetables, fiber, calcium, iron, and 1 mg of folate supplement per day (for females capable of pregnancy).  [x]    Stressed the importance of regular exercise.   [x]    Substance Abuse: Discussed cessation/primary prevention of tobacco, alcohol, or other drug use; driving or other dangerous activities under the influence; availability of treatment for abuse.   [x]    Injury prevention: Discussed safety belts, safety helmets, smoke detector, smoking near bedding or upholstery.   [x]    Sexuality: Discussed sexually transmitted diseases, partner selection, use of condoms, avoidance of unintended pregnancy  and contraceptive alternatives.  [x]    Dental health: Discussed importance of regular tooth brushing, flossing, and dental visits.  [x]    Health maintenance and immunizations reviewed. Please refer to Health maintenance section.   I,Havlyn C Ratchford,acting as a Education administrator for [employer], PA.,have documented all relevant documentation on the behalf of Inda Coke, PA,as directed by  Inda Coke, PA while in the presence of Inda Coke, [place].  I, Inda Coke, [place], have reviewed all documentation for this visit. The documentation on 05/11/21 for the exam, diagnosis, procedures, and orders are all accurate and complete.   Inda Coke, PA-C [place]

## 2021-05-14 ENCOUNTER — Other Ambulatory Visit (HOSPITAL_BASED_OUTPATIENT_CLINIC_OR_DEPARTMENT_OTHER): Payer: Self-pay

## 2021-05-14 ENCOUNTER — Other Ambulatory Visit: Payer: Self-pay

## 2021-05-14 ENCOUNTER — Ambulatory Visit: Payer: 59 | Attending: Internal Medicine

## 2021-05-14 DIAGNOSIS — Z23 Encounter for immunization: Secondary | ICD-10-CM

## 2021-05-14 MED ORDER — PFIZER COVID-19 VAC BIVALENT 30 MCG/0.3ML IM SUSP
INTRAMUSCULAR | 0 refills | Status: DC
  Filled 2021-05-14: qty 0.3, 1d supply, fill #0

## 2021-05-14 NOTE — Progress Notes (Signed)
° °  Covid-19 Vaccination Clinic  Name:  Julie Yang    MRN: [id number] DOB: 12-01-1970  05/14/2021  [name] was observed post Covid-19 immunization for 15 minutes without incident. She was provided with Vaccine Information Sheet and instruction to access the V-Safe system.   [name] was instructed to call [phone] with any severe reactions post vaccine: Difficulty breathing  Swelling of face and throat  A fast heartbeat  A bad rash all over body  Dizziness and weakness   Immunizations Administered     Name Date Dose VIS Date Route   Pfizer Covid-19 Vaccine Bivalent Booster 05/14/2021 10:05 AM 0.3 mL 12/27/2020 Intramuscular   Manufacturer: [place]   Lot: [id number]   [place]: [phone]

## 2021-05-16 NOTE — Therapy (Signed)
OUTPATIENT PHYSICAL THERAPY EVALUATION   Patient Name: Julie Yang MRN: [id number] DOB:1970/08/08, 51 y.o., female Today's Date: 05/17/2021   PT End of Session - 05/17/21 1122     Visit Number 1    Number of Visits 8    Date for PT Re-Evaluation 07/12/21    Authorization Type [place] [employer]    PT Start Time 1130    PT Stop Time 1215    PT Time Calculation (min) 45 min    Activity Tolerance Patient tolerated treatment well    Behavior During Therapy [facility] for tasks assessed/performed             Past Medical History:  Diagnosis Date   Abnormal Pap smear of cervix    1990s--hx of cryotherapy to cervix   Anxiety    COVID-19 virus infection 10/2019   Genital warts    Thyroid disease 2013   Urticaria    chronic autoimmune urticaria   Past Surgical History:  Procedure Laterality Date   CESAREAN SECTION     CYST EXCISION     from chest, benign   Patient Active Problem List   Diagnosis Date Noted   Lateral epicondylitis of both elbows 09/22/2020   Vitamin D deficiency 11/10/2017   Other allergic rhinitis 12/23/2016   Hypothyroidism 10/26/2015    Class: History of   Chronic urticaria 09/19/2015   Allergic reaction 09/19/2015   Angioedema 09/19/2015    PCP: Inda Coke, PA  REFERRING PROVIDER: Trula Slade, DPM  REFERRING DIAG: Plantar fasciitis, right  THERAPY DIAG:  Pain in right foot  Muscle weakness (generalized)  ONSET DATE: worsening over past 6 months  SUBJECTIVE:  SUBJECTIVE STATEMENT: Patient reports that she is having bilateral foot pain, plantar fasciitis most notable on right. Usually she can make it go away with cream and stretching but it is not working this time. She is also getting a sharp shooting pain in the arch on the right side. Pain occurs pretty much any time she walks and she tries to wear tennis shoes when possible. The shooting pain tends to happen at random times, and she will have an achy pain at the end of the day  after she takes her shoes off and rests. Overall, pain has been ongoing for multiple years but has been worsened over past 6 months. She notes a busy event season where she was on her feet for long periods and wearing dress shoes. Patient reports she tries to walk 30-40 minutes a couple days per week, she used to able to walk a lot more.  Patient also notes tendinitis or something in the left elbow that has been bothering her recently.   PERTINENT HISTORY: None  PAIN:  Are you having pain? Yes NPRS scale: 5-6/10 (up to 7-8/10) Pain location: Foot (heel and arch) Pain orientation: Right  PAIN TYPE: Chronic Pain description: Achy Aggravating factors: Walking, being on feet Relieving factors: Creams and stretching, foot massager, ice/heat  PRECAUTIONS: None  WEIGHT BEARING RESTRICTIONS No  FALLS:  Has patient fallen in last 6 months? 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  PATIENT SURVEYS:  FOTO  59% functional status  COGNITION: Overall cognitive status: Within functional limits for tasks assessed     SENSATION:  Light touch: Appears intact  MUSCLE LENGTH: Calf flexibility grossly WFL  POSTURE:  Patient did not demonstrate arch collapse when transitioning from non-weightbearing to weightbearing  PALPATION: Tender right plantar fascia throughout and notable closer to calcaneal insertion, abductor hallucis, posterior and lateral heel  LE AROM:  AROM (deg) Right 05/17/2021 Left 05/17/2021  Ankle dorsiflexion 15 15  Ankle plantarflexion 70 70  Ankle inversion 45 45  Ankle eversion 30 28    LE MMT:  MMT Right 05/17/2021 Left 05/17/2021  Hip flexion 4 4  Hip extension 4 4  Hip abduction 4 4  Knee flexion 5 5  Knee extension 5 5  Ankle dorsiflexion 5 5  Ankle plantarflexion 4 4  Ankle inversion 4+ 4+  Ankle eversion 5 5   FUNCTIONAL TESTS:  SLS: patient able to balance > 30 sec bilaterally, greater difficulty and sway noted on right LE, patient report arch cramping sensation bilaterally  GAIT: Assistive device utilized: None Level of assistance: Complete Independence Comments: WFL   TODAY'S TREATMENT: SL heel raise SL stance Toe yoga SMFR using ball for plantar fascia and calf   PATIENT EDUCATION:  Education details: Exam findings, POC, HEP Person educated: Patient Education method: Explanation, Demonstration, Tactile cues, Verbal cues, and Handouts Education comprehension: verbalized understanding, returned demonstration, verbal cues required, tactile cues required, and needs further education  HOME EXERCISE PROGRAM: Access Code: [email or web address]   ASSESSMENT: CLINICAL IMPRESSION: Patient is a 51 y.o. female who was seen today for physical therapy evaluation and treatment for bilateral plantar foot pain, most notable on right. Pain is consistent with plantar fasciitis and she demonstrates strength and control deficits bilaterally. Objective impairments include decreased balance, decreased strength, and pain. These impairments are limiting patient from community activity, occupation, and shopping. Personal factors including Past/current experiences and Time since onset of injury/illness/exacerbation are also affecting patient's functional outcome. Patient will benefit from skilled PT to address above impairments and improve overall function.  REHAB POTENTIAL: Good  CLINICAL DECISION MAKING: Stable/uncomplicated  EVALUATION COMPLEXITY: Low   GOALS: Goals reviewed with patient? Yes  SHORT TERM GOALS:  STG Name Target Date Goal status  1 Patient will be I  with initial HEP in order to progress with therapy. Baseline:  06/14/2021 INITIAL  2 PT will review FOTO with patient by 3rd visit in order to understand expected progress and outcome with therapy. Baseline:  06/14/2021 INITIAL  3 Patient will demonstrate SLS without difficulty or sway bilaterally. Baseline: patient with greater difficulty on right with increased sway 06/14/2021 INITIAL   LONG TERM GOALS:   LTG Name Target Date Goal status  1 Patient will be I with final HEP to maintain progress from PT. Baseline: 07/12/2021 INITIAL  2 Patient will report >/= 74% status on FOTO to indicate improved functional ability. Baseline: 59% 07/12/2021 INITIAL  3 Patient will report pain level with all work related activities </= 2/10 in order to maximize functional ability Baseline: 5-6/10 (can be up to 7-8/10) 07/12/2021 INITIAL  4 Patient will demonstrate ankle strength grossly 5/5 MMT Baseline: patient demonstrates strength deficit of the ankle bilaterally 07/12/2021 INITIAL    PLAN: PT FREQUENCY: 1x/week  PT DURATION: 8 weeks  PLANNED INTERVENTIONS: Therapeutic exercises, Therapeutic activity, Neuro Muscular re-education, Balance training, Gait training, Patient/Family education, Joint mobilization, Aquatic Therapy, Dry Needling, Cryotherapy, Moist heat, Taping, Ultrasound, and Manual therapy  PLAN FOR NEXT SESSION: Review  HEP and progress PRN, dry needling for arch and calf PRN, initiate posterior tib strengthening and continue calf strengthening (heel raise with toe extension), arch control and balance training   Hilda Blades, PT, DPT, LAT, ATC 05/17/21  12:44 PM Phone: [phone] Fax: [phone]

## 2021-05-17 ENCOUNTER — Other Ambulatory Visit: Payer: Self-pay

## 2021-05-17 ENCOUNTER — Ambulatory Visit: Payer: 59 | Attending: Podiatry | Admitting: Physical Therapy

## 2021-05-17 ENCOUNTER — Encounter: Payer: Self-pay | Admitting: Physical Therapy

## 2021-05-17 DIAGNOSIS — M722 Plantar fascial fibromatosis: Secondary | ICD-10-CM | POA: Diagnosis not present

## 2021-05-17 DIAGNOSIS — M6281 Muscle weakness (generalized): Secondary | ICD-10-CM | POA: Diagnosis not present

## 2021-05-17 DIAGNOSIS — M79671 Pain in right foot: Secondary | ICD-10-CM | POA: Insufficient documentation

## 2021-05-17 DIAGNOSIS — M7750 Other enthesopathy of unspecified foot: Secondary | ICD-10-CM | POA: Diagnosis not present

## 2021-05-17 NOTE — Patient Instructions (Signed)
Access Code: [email or web address] URL: [email or web address] Date: 05/17/2021 Prepared by: Hilda Blades  Exercises Single Leg Heel Raise with Chair Support - 1 x daily - 4 x weekly - 3 sets - 8-10 reps Single Leg Stance - 1 x daily - 7 x weekly - 3 reps - 30 seconds hold Toe Yoga - Alternating Great Toe and Lesser Toe Extension - 1 x daily - 7 x weekly - 2 sets - 10 reps - 5 seconds hold Seated Plantar Fascia Mobilization with Small Ball Calf Mobilization with Small Diona Foley

## 2021-05-25 ENCOUNTER — Ambulatory Visit: Payer: 59

## 2021-05-31 NOTE — Therapy (Signed)
OUTPATIENT PHYSICAL THERAPY TREATMENT NOTE   Patient Name: Julie Yang MRN: [id number] DOB:06-26-1970, 51 y.o., female Today's Date: 06/01/2021  PCP: Inda Coke, [place] REFERRING PROVIDER: Trula Slade, DPM   PT End of Session - 06/01/21 1046     Visit Number 2    Number of Visits 8    Date for PT Re-Evaluation 07/12/21    Authorization Type [place] [employer]    PT Start Time 1045    PT Stop Time 1130    PT Time Calculation (min) 45 min    Activity Tolerance Patient tolerated treatment well    Behavior During Therapy [facility] for tasks assessed/performed             Past Medical History:  Diagnosis Date   Abnormal Pap smear of cervix    1990s--hx of cryotherapy to cervix   Anxiety    COVID-19 virus infection 10/2019   Genital warts    Thyroid disease 2013   Urticaria    chronic autoimmune urticaria   Past Surgical History:  Procedure Laterality Date   CESAREAN SECTION     CYST EXCISION     from chest, benign   Patient Active Problem List   Diagnosis Date Noted   Lateral epicondylitis of both elbows 09/22/2020   Vitamin D deficiency 11/10/2017   Other allergic rhinitis 12/23/2016   Hypothyroidism 10/26/2015    Class: History of   Chronic urticaria 09/19/2015   Allergic reaction 09/19/2015   Angioedema 09/19/2015    REFERRING PROVIDER: Trula Slade, DPM   REFERRING DIAG: Plantar fasciitis, right  THERAPY DIAG:  Pain in right foot  Muscle weakness (generalized)  PERTINENT HISTORY: None  PRECAUTIONS: None  SUBJECTIVE: Patient reports she has been doing well, she has been busy so tiring. 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   PAIN:  Are you having pain? Yes NPRS scale: 5/10 (left 0/10) Pain location: Foot (heel and arch) Pain orientation: Right  PAIN TYPE: Chronic Pain description: Achy Aggravating  factors: Walking, being on feet Relieving factors: Creams and stretching, foot massager, ice/heat  PATIENT GOALS: Minimize pain, strategies to reduce pain when on feet for events   OBJECTIVE:  PATIENT SURVEYS:  FOTO 59% functional status   PALPATION: Tender right plantar fascia throughout and notable closer to calcaneal insertion, abductor hallucis, posterior and lateral heel   LE AROM:   AROM (deg) Right 05/17/2021 Left 05/17/2021  Ankle dorsiflexion 15 15  Ankle plantarflexion 70 70  Ankle inversion 45 45  Ankle eversion 30 28    LE MMT:   MMT Right 05/17/2021 Left 05/17/2021 Right / Left 06/01/2021  Hip flexion 4 4   Hip extension 4 4   Hip abduction 4 4   Knee flexion 5 5   Knee extension 5 5   Ankle dorsiflexion 5 5   Ankle plantarflexion 4 4 4  / 4  Ankle inversion 4+ 4+ 4+ / 4+  Ankle eversion 5 5     FUNCTIONAL TESTS:  SLS: patient able to balance > 30 sec bilaterally, greater difficulty and sway noted on right LE     TODAY'S TREATMENT:  06/01/2021:  Therapeutic Exercise: Recumbent bike L2 x 5 min while taking subjective Slant board calf stretch 3 x 30 sec Seated figure-4 ankle inversion with red band 2 x 20 Heel raises  with ball squeeze between heels 2 x 20 SLS 3 x 30 sec each Manual: STM right plantar fascia, abductor hallucis regions Trigger Point Dry Needling Treatment: Pre-treatment instruction: Patient instructed on dry needling rationale, procedures, and possible side effects including pain during treatment (achy,cramping feeling), bruising, drop of blood, lightheadedness, nausea, sweating. Patient Consent Given: Yes Education handout provided: No Muscles treated: Abductor hallucis, interossei, flexor digitorum brevis and quadratus plantae Needle size and number: .30x6mm x 2 and .25x37mm x 2 Electrical stimulation performed: No Parameters: N/A Treatment response/outcome: Twitch response elicited and Palpable decrease in muscle  tension Post-treatment instructions: Patient instructed to expect possible mild to moderate muscle soreness later today and/or tomorrow. Patient instructed in methods to reduce muscle soreness and to continue prescribed HEP. If patient was dry needled over the lung field, patient was instructed on signs and symptoms of pneumothorax and, however unlikely, to see immediate medical attention should they occur. Patient was also educated on signs and symptoms of infection and to seek medical attention should they occur. Patient verbalized understanding of these instructions and education.     05/17/2021:  Therapeutic Exercise: SL heel raise SL stance Toe yoga SMFR using ball for plantar fascia and calf   PATIENT EDUCATION:  Education details: HEP, TPDN Person educated: Patient Education method: Explanation, Demonstration, Tactile cues, Verbal cues Education comprehension: verbalized understanding, returned demonstration, verbal cues required, tactile cues required, and needs further education   HOME EXERCISE PROGRAM: Access Code: [email or web address]     ASSESSMENT: CLINICAL IMPRESSION: Patient tolerated therapy well with no adverse effects. Performed TPDN this visit for right foot with twitch responses elicited and patient reporting reproduction of similar pain. Therapy continued focus on progressing strength and control of the foot and ankle. Updated HEP to progress posterior tib strengthening. Will assess patient response to dry needling to determine continued treatment. Patient would benefit from continued skilled PT to progress strength and control in order to reduce pain and maximize functional ability.     GOALS: Goals reviewed with patient? Yes   SHORT TERM GOALS:   STG Name Target Date Goal status  1 Patient will be I with initial HEP in order to progress with therapy. Baseline: progressing 06/14/2021 ONGOING  2 PT will review FOTO with patient by 3rd visit in order to understand expected  progress and outcome with therapy. Baseline:  06/14/2021 INITIAL  3 Patient will demonstrate SLS without difficulty or sway bilaterally. Baseline: patient with greater difficulty on right with increased sway 06/14/2021 ONGOING    LONG TERM GOALS:    LTG Name Target Date Goal status  1 Patient will be I with final HEP to maintain progress from PT. Baseline: 07/12/2021 INITIAL  2 Patient will report >/= 74% status on FOTO to indicate improved functional ability. Baseline: 59% 07/12/2021 INITIAL  3 Patient will report pain level with all work related activities </= 2/10 in order to maximize functional ability Baseline: 5-6/10 (can be up to 7-8/10) 07/12/2021 INITIAL  4 Patient will demonstrate ankle strength grossly 5/5 MMT Baseline: patient demonstrates strength deficit of the ankle bilaterally 07/12/2021 INITIAL      PLAN: PT FREQUENCY: 1x/week   PT DURATION: 8 weeks   PLANNED INTERVENTIONS: Therapeutic exercises, Therapeutic activity, Neuro Muscular re-education, Balance training, Gait training, Patient/Family education, Joint mobilization, Aquatic Therapy, Dry Needling, Cryotherapy, Moist heat, Taping, Ultrasound, and Manual therapy   PLAN FOR NEXT SESSION: Review HEP and progress PRN, dry needling for arch and calf PRN, initiate posterior tib strengthening and continue calf  strengthening (heel raise with toe extension), arch control and balance training   Hilda Blades, PT, DPT, LAT, ATC 06/01/21  12:23 PM Phone: [phone] Fax: [phone]

## 2021-06-01 ENCOUNTER — Ambulatory Visit: Payer: 59 | Attending: Podiatry | Admitting: Physical Therapy

## 2021-06-01 ENCOUNTER — Encounter: Payer: Self-pay | Admitting: Physical Therapy

## 2021-06-01 ENCOUNTER — Other Ambulatory Visit: Payer: Self-pay

## 2021-06-01 DIAGNOSIS — M6281 Muscle weakness (generalized): Secondary | ICD-10-CM | POA: Insufficient documentation

## 2021-06-01 DIAGNOSIS — M79671 Pain in right foot: Secondary | ICD-10-CM | POA: Insufficient documentation

## 2021-06-01 NOTE — Patient Instructions (Signed)
Access Code: [email or web address] URL: [email or web address] Date: 06/01/2021 Prepared by: Hilda Blades  Exercises Single Leg Heel Raise with Chair Support - 1 x daily - 4 x weekly - 3 sets - 8-10 reps Single Leg Stance - 1 x daily - 7 x weekly - 3 reps - 30 seconds hold Toe Yoga - Alternating Great Toe and Lesser Toe Extension - 1 x daily - 7 x weekly - 2 sets - 10 reps - 5 seconds hold Seated Plantar Fascia Mobilization with Small Ball Calf Mobilization with Small Ball Seated Figure 4 Ankle Inversion with Resistance - 1 x daily - 7 x weekly - 3 sets - 20 reps

## 2021-06-05 NOTE — Therapy (Signed)
OUTPATIENT PHYSICAL THERAPY TREATMENT NOTE   Patient Name: Julie Yang MRN: [id number] DOB:June 02, 1970, 51 y.o., female Today's Date: 06/08/2021  PCP: Inda Coke, [place] REFERRING PROVIDER: Inda Coke, [place]   PT End of Session - 06/08/21 1048     Visit Number 3    Number of Visits 8    Date for PT Re-Evaluation 07/12/21    Authorization Type [place] [employer]    PT Start Time 1048    PT Stop Time 1130    PT Time Calculation (min) 42 min    Activity Tolerance Patient tolerated treatment well    Behavior During Therapy [facility] for tasks assessed/performed              Past Medical History:  Diagnosis Date   Abnormal Pap smear of cervix    1990s--hx of cryotherapy to cervix   Anxiety    COVID-19 virus infection 10/2019   Genital warts    Thyroid disease 2013   Urticaria    chronic autoimmune urticaria   Past Surgical History:  Procedure Laterality Date   CESAREAN SECTION     CYST EXCISION     from chest, benign   Patient Active Problem List   Diagnosis Date Noted   Lateral epicondylitis of both elbows 09/22/2020   Vitamin D deficiency 11/10/2017   Other allergic rhinitis 12/23/2016   Hypothyroidism 10/26/2015    Class: History of   Chronic urticaria 09/19/2015   Allergic reaction 09/19/2015   Angioedema 09/19/2015    REFERRING PROVIDER: Trula Slade, DPM   REFERRING DIAG: Plantar fasciitis, right  THERAPY DIAG:  Pain in right foot  Muscle weakness (generalized)  PERTINENT HISTORY: None  PRECAUTIONS: None  SUBJECTIVE: Patient reports she was really sore for 2-3 days after last visit so wants to hold off this visit due to having to perform this weekend. She states she has a little pain in her heel right now.  PAIN:  Are you having pain? Yes NPRS scale: 5/10 (left 0/10) Pain location: Foot (heel) Pain orientation: Right  PAIN TYPE: Chronic Pain description: Achy Aggravating factors: Walking, being on feet Relieving factors: Creams  and stretching, foot massager, ice/heat  PATIENT GOALS: Minimize pain, strategies to reduce pain when on feet for events   OBJECTIVE:  PATIENT SURVEYS:  FOTO 59% functional status   PALPATION: Tender right plantar fascia throughout and notable closer to calcaneal insertion, abductor hallucis, posterior and lateral heel   LE AROM:   AROM (deg) Right 05/17/2021 Left 05/17/2021  Ankle dorsiflexion 15 15  Ankle plantarflexion 70 70  Ankle inversion 45 45  Ankle eversion 30 28    LE MMT:   MMT Right 05/17/2021 Left 05/17/2021 Right / Left 06/01/2021 Right / Left  Hip flexion 4 4    Hip extension 4 4    Hip abduction 4 4    Knee flexion 5 5    Knee extension 5 5    Ankle dorsiflexion 5 5    Ankle plantarflexion 4 4 4  / 4 4+ / 4+  Ankle inversion 4+ 4+ 4+ / 4+   Ankle eversion 5 5      FUNCTIONAL TESTS:  SLS: patient able to balance > 30 sec bilaterally, greater difficulty and sway noted on right LE     TODAY'S TREATMENT:  06/08/2021: Therapeutic Exercise: Recumbent bike L2 x 5 min while taking subjective Longsitting PF with black 2 x 15 (right only) Longsitting inversion with blue 2 x 15 (right only) Seated heel raise  with 45# 3 x 10 (right only) SLS on green pad 3 x 30 sec each SL heel raise edge of step 3 x 10 each Lateral band walk with blue around ankles 2 x 20 Manual: IASTM right plantar fascia and calf region STM and ART for right plantar region and calf   06/01/2021:  Therapeutic Exercise: Recumbent bike L2 x 5 min while taking subjective Slant board calf stretch 3 x 30 sec Seated figure-4 ankle inversion with red band 2 x 20 Heel raises with ball squeeze between heels 2 x 20 SLS 3 x 30 sec each Manual: STM right plantar fascia, abductor hallucis regions Trigger Point Dry Needling Treatment: Pre-treatment instruction: Patient instructed on dry needling rationale, procedures, and possible side effects including pain during treatment (achy,cramping feeling),  bruising, drop of blood, lightheadedness, nausea, sweating. Patient Consent Given: Yes Education handout provided: No Muscles treated: Abductor hallucis, interossei, flexor digitorum brevis and quadratus plantae Needle size and number: .30x99mm x 2 and .25x72mm x 2 Electrical stimulation performed: No Parameters: N/A Treatment response/outcome: Twitch response elicited and Palpable decrease in muscle tension Post-treatment instructions: Patient instructed to expect possible mild to moderate muscle soreness later today and/or tomorrow. Patient instructed in methods to reduce muscle soreness and to continue prescribed HEP. If patient was dry needled over the lung field, patient was instructed on signs and symptoms of pneumothorax and, however unlikely, to see immediate medical attention should they occur. Patient was also educated on signs and symptoms of infection and to seek medical attention should they occur. Patient verbalized understanding of these instructions and education.   PATIENT EDUCATION:  Education details: HEP Person educated: Patient Education method: Education officer, environmental, Corporate treasurer cues, Verbal cues Education comprehension: verbalized understanding, returned demonstration, verbal cues required, tactile cues required, and needs further education   HOME EXERCISE PROGRAM: Access Code: [email or web address]     ASSESSMENT: CLINICAL IMPRESSION: Patient tolerated therapy well with no adverse effects. Therapy focused on continued manual to reduce muscular tension and discomfort, and then progressed loading this visit for strengthening and tendon tolerance for activity. Patient did not report any increased pain with therapy, and able to progress well with resistance and exercise difficulty. No changes made to HEP this visit. Patient would benefit from continued skilled PT to progress strength and control in order to reduce pain and maximize functional ability.  Objective impairments include  decreased balance, decreased strength, and pain.      GOALS: Goals reviewed with patient? Yes   SHORT TERM GOALS:   STG Name Target Date Goal status  1 Patient will be I with initial HEP in order to progress with therapy. Baseline: progressing 06/14/2021 ONGOING  2 PT will review FOTO with patient by 3rd visit in order to understand expected progress and outcome with therapy. Baseline: reviewed 3rd visit 06/14/2021 ACHIEVED  3 Patient will demonstrate SLS without difficulty or sway bilaterally. Baseline: patient with greater difficulty on right with increased sway 06/14/2021 ONGOING    LONG TERM GOALS:    LTG Name Target Date Goal status  1 Patient will be I with final HEP to maintain progress from PT. Baseline: 07/12/2021 INITIAL  2 Patient will report >/= 74% status on FOTO to indicate improved functional ability. Baseline: 59% 07/12/2021 INITIAL  3 Patient will report pain level with all work related activities </= 2/10 in order to maximize functional ability Baseline: 5-6/10 (can be up to 7-8/10) 07/12/2021 INITIAL  4 Patient will demonstrate ankle strength grossly 5/5 MMT Baseline: patient demonstrates strength  deficit of the ankle bilaterally 07/12/2021 INITIAL      PLAN: PT FREQUENCY: 1x/week   PT DURATION: 8 weeks   PLANNED INTERVENTIONS: Therapeutic exercises, Therapeutic activity, Neuro Muscular re-education, Balance training, Gait training, Patient/Family education, Joint mobilization, Aquatic Therapy, Dry Needling, Cryotherapy, Moist heat, Taping, Ultrasound, and Manual therapy   PLAN FOR NEXT SESSION: Review HEP and progress PRN, dry needling for arch and calf PRN, posterior tib strengthening and continue calf strengthening (heel raise with toe extension), arch control and balance training   Hilda Blades, PT, DPT, LAT, ATC 06/08/21  12:48 PM Phone: [phone] Fax: [phone]

## 2021-06-08 ENCOUNTER — Ambulatory Visit: Payer: 59 | Admitting: Physical Therapy

## 2021-06-08 ENCOUNTER — Encounter: Payer: Self-pay | Admitting: Physical Therapy

## 2021-06-08 ENCOUNTER — Other Ambulatory Visit: Payer: Self-pay

## 2021-06-08 DIAGNOSIS — M79671 Pain in right foot: Secondary | ICD-10-CM | POA: Diagnosis not present

## 2021-06-08 DIAGNOSIS — M6281 Muscle weakness (generalized): Secondary | ICD-10-CM

## 2021-06-13 NOTE — Therapy (Incomplete)
OUTPATIENT PHYSICAL THERAPY TREATMENT NOTE   Patient Name: Julie Yang MRN: [id number] DOB:Feb 16, 1971, 51 y.o., female Today's Date: 06/13/2021  PCP: Inda Coke, PA REFERRING PROVIDER: Trula Slade, DPM      Past Medical History:  Diagnosis Date   Abnormal Pap smear of cervix    1990s--hx of cryotherapy to cervix   Anxiety    COVID-19 virus infection 10/2019   Genital warts    Thyroid disease 2013   Urticaria    chronic autoimmune urticaria   Past Surgical History:  Procedure Laterality Date   CESAREAN SECTION     CYST EXCISION     from chest, benign   Patient Active Problem List   Diagnosis Date Noted   Lateral epicondylitis of both elbows 09/22/2020   Vitamin D deficiency 11/10/2017   Other allergic rhinitis 12/23/2016   Hypothyroidism 10/26/2015    Class: History of   Chronic urticaria 09/19/2015   Allergic reaction 09/19/2015   Angioedema 09/19/2015    REFERRING PROVIDER: Trula Slade, DPM   REFERRING DIAG: Plantar fasciitis, right  THERAPY DIAG:  No diagnosis found.  PERTINENT HISTORY: None  PRECAUTIONS: None  SUBJECTIVE: Patient reports she was really sore for 2-3 days after last visit so wants to hold off this visit due to having to perform this weekend. She states she has a little pain in her heel right now.  PAIN:  Are you having pain? Yes NPRS scale: 5/10 (left 0/10) Pain location: Foot (heel) Pain orientation: Right  PAIN TYPE: Chronic Pain description: Achy Aggravating factors: Walking, being on feet Relieving factors: Creams and stretching, foot massager, ice/heat  PATIENT GOALS: Minimize pain, strategies to reduce pain when on feet for events   OBJECTIVE:  PATIENT SURVEYS:  FOTO 59% functional status   PALPATION: Tender right plantar fascia throughout and notable closer to calcaneal insertion, abductor hallucis, posterior and lateral heel   LE AROM:   AROM (deg) Right 05/17/2021 Left 05/17/2021  Ankle  dorsiflexion 15 15  Ankle plantarflexion 70 70  Ankle inversion 45 45  Ankle eversion 30 28    LE MMT:   MMT Right 05/17/2021 Left 05/17/2021 Right / Left 06/01/2021 Right / Left  Hip flexion 4 4    Hip extension 4 4    Hip abduction 4 4    Knee flexion 5 5    Knee extension 5 5    Ankle dorsiflexion 5 5    Ankle plantarflexion 4 4 4  / 4 4+ / 4+  Ankle inversion 4+ 4+ 4+ / 4+   Ankle eversion 5 5      FUNCTIONAL TESTS:  SLS: patient able to balance > 30 sec bilaterally, greater difficulty and sway noted on right LE     TODAY'S TREATMENT:  06/15/2021:   06/08/2021: Therapeutic Exercise: Recumbent bike L2 x 5 min while taking subjective Longsitting PF with black 2 x 15 (right only) Longsitting inversion with blue 2 x 15 (right only) Seated heel raise with 45# 3 x 10 (right only) SLS on green pad 3 x 30 sec each SL heel raise edge of step 3 x 10 each Lateral band walk with blue around ankles 2 x 20 Manual: IASTM right plantar fascia and calf region STM and ART for right plantar region and calf  06/01/2021:  Therapeutic Exercise: Recumbent bike L2 x 5 min while taking subjective Slant board calf stretch 3 x 30 sec Seated figure-4 ankle inversion with red band 2 x 20 Heel raises with ball squeeze  between heels 2 x 20 SLS 3 x 30 sec each Manual: STM right plantar fascia, abductor hallucis regions Trigger Point Dry Needling Treatment: Pre-treatment instruction: Patient instructed on dry needling rationale, procedures, and possible side effects including pain during treatment (achy,cramping feeling), bruising, drop of blood, lightheadedness, nausea, sweating. Patient Consent Given: Yes Education handout provided: No Muscles treated: Abductor hallucis, interossei, flexor digitorum brevis and quadratus plantae Needle size and number: .30x26mm x 2 and .25x66mm x 2 Electrical stimulation performed: No Parameters: N/A Treatment response/outcome: Twitch response elicited and  Palpable decrease in muscle tension Post-treatment instructions: Patient instructed to expect possible mild to moderate muscle soreness later today and/or tomorrow. Patient instructed in methods to reduce muscle soreness and to continue prescribed HEP. If patient was dry needled over the lung field, patient was instructed on signs and symptoms of pneumothorax and, however unlikely, to see immediate medical attention should they occur. Patient was also educated on signs and symptoms of infection and to seek medical attention should they occur. Patient verbalized understanding of these instructions and education.   PATIENT EDUCATION:  Education details: HEP Person educated: Patient Education method: Education officer, environmental, Corporate treasurer cues, Verbal cues Education comprehension: verbalized understanding, returned demonstration, verbal cues required, tactile cues required, and needs further education   HOME EXERCISE PROGRAM: Access Code: [email or web address]     ASSESSMENT: CLINICAL IMPRESSION: Patient tolerated therapy well with no adverse effects. Therapy focused on continued manual to reduce muscular tension and discomfort, and then progressed loading this visit for strengthening and tendon tolerance for activity. Patient did not report any increased pain with therapy, and able to progress well with resistance and exercise difficulty. No changes made to HEP this visit. Patient would benefit from continued skilled PT to progress strength and control in order to reduce pain and maximize functional ability.  Objective impairments include decreased balance, decreased strength, and pain.      GOALS: Goals reviewed with patient? Yes   SHORT TERM GOALS:   STG Name Target Date Goal status  1 Patient will be I with initial HEP in order to progress with therapy. Baseline: progressing 06/14/2021 ONGOING  2 PT will review FOTO with patient by 3rd visit in order to understand expected progress and outcome with  therapy. Baseline: reviewed 3rd visit 06/14/2021 ACHIEVED  3 Patient will demonstrate SLS without difficulty or sway bilaterally. Baseline: patient with greater difficulty on right with increased sway 06/14/2021 ONGOING    LONG TERM GOALS:    LTG Name Target Date Goal status  1 Patient will be I with final HEP to maintain progress from PT. Baseline: 07/12/2021 INITIAL  2 Patient will report >/= 74% status on FOTO to indicate improved functional ability. Baseline: 59% 07/12/2021 INITIAL  3 Patient will report pain level with all work related activities </= 2/10 in order to maximize functional ability Baseline: 5-6/10 (can be up to 7-8/10) 07/12/2021 INITIAL  4 Patient will demonstrate ankle strength grossly 5/5 MMT Baseline: patient demonstrates strength deficit of the ankle bilaterally 07/12/2021 INITIAL      PLAN: PT FREQUENCY: 1x/week   PT DURATION: 8 weeks   PLANNED INTERVENTIONS: Therapeutic exercises, Therapeutic activity, Neuro Muscular re-education, Balance training, Gait training, Patient/Family education, Joint mobilization, Aquatic Therapy, Dry Needling, Cryotherapy, Moist heat, Taping, Ultrasound, and Manual therapy   PLAN FOR NEXT SESSION: Review HEP and progress PRN, dry needling for arch and calf PRN, posterior tib strengthening and continue calf strengthening (heel raise with toe extension), arch control and balance training   Megan Salon  [employer] [employer]ark, PT, DPT, LAT, ATC 06/13/21  4:06 PM Phone: [phone] Fax: [phone]

## 2021-06-15 ENCOUNTER — Ambulatory Visit: Payer: 59 | Admitting: Physical Therapy

## 2021-07-12 IMAGING — US US BREAST*R* LIMITED INC AXILLA
1 series · 4 of 4 positions shown · non-contrast
Comparison: Previous exam(s).

CLINICAL DATA: 48-year-old patient has noticed a 2-3 mm raised skin
area adjacent to the outer portion of the right areola. Several
months ago, it was more prominent and some fluid could be expressed
from it. It has decreased in prominence. Question sebaceous cyst of
skin.

EXAM:
DIGITAL DIAGNOSTIC RIGHT MAMMOGRAM WITH CAD AND TOMO
ULTRASOUND RIGHT BREAST

[Series 1: us breast*right* limited inc axilla · 0.05mm/px · 4 of 4 slices shown]
[im 1/4]
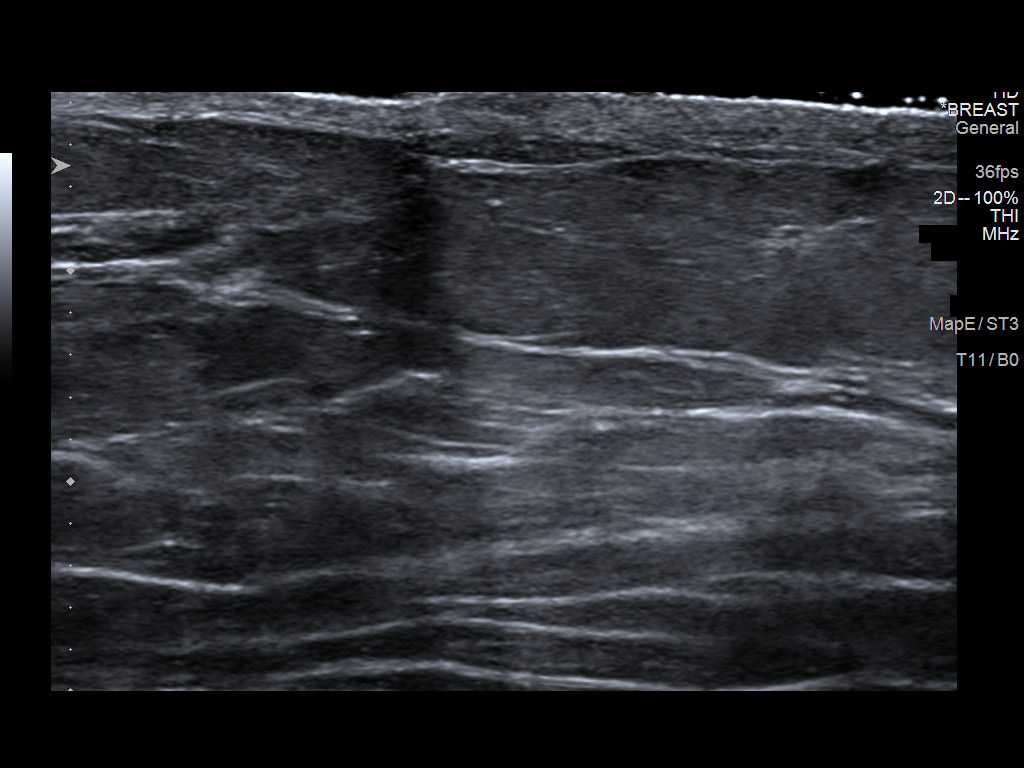
[im 2/4]
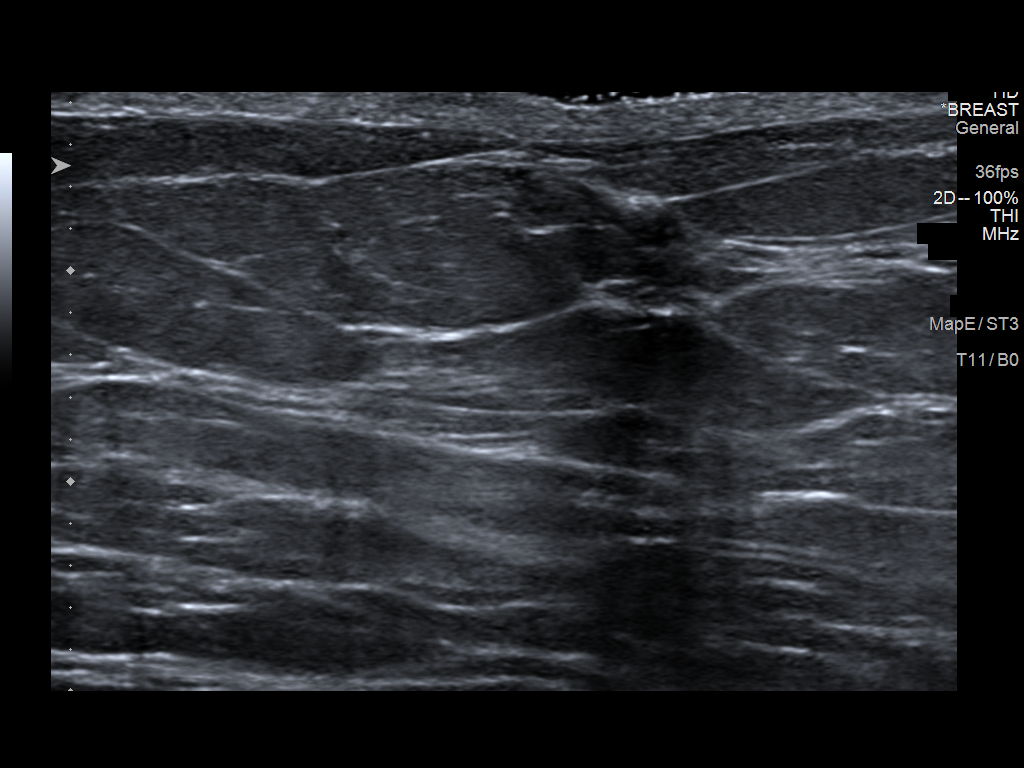
[im 3/4]
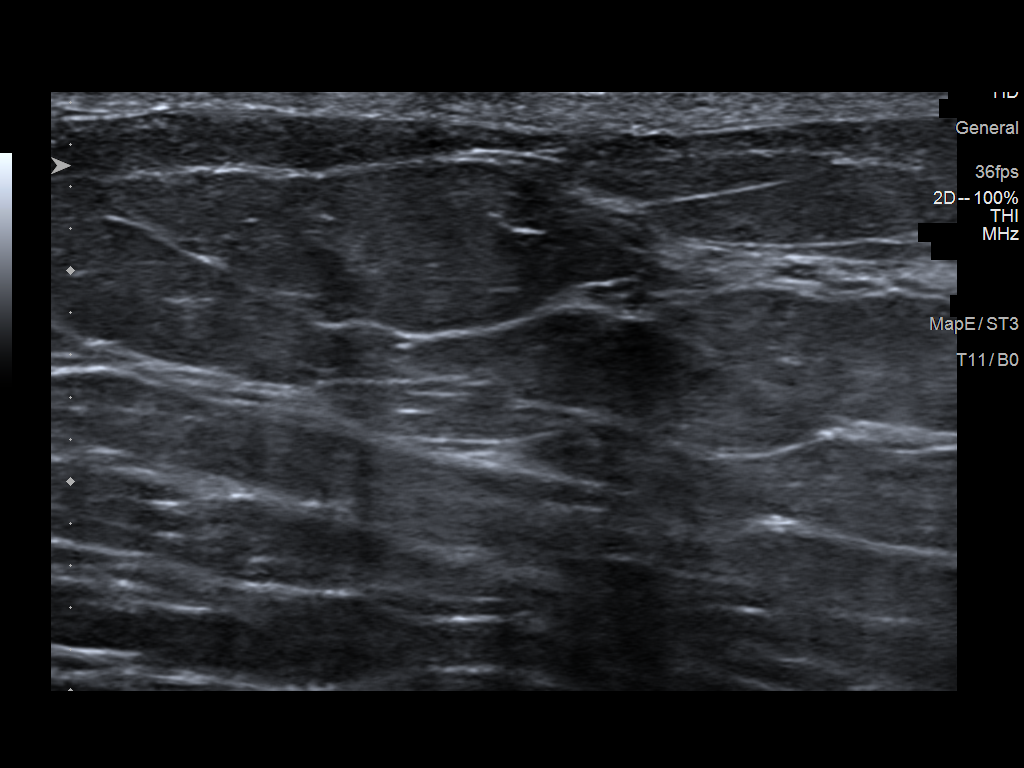
[im 4/4]
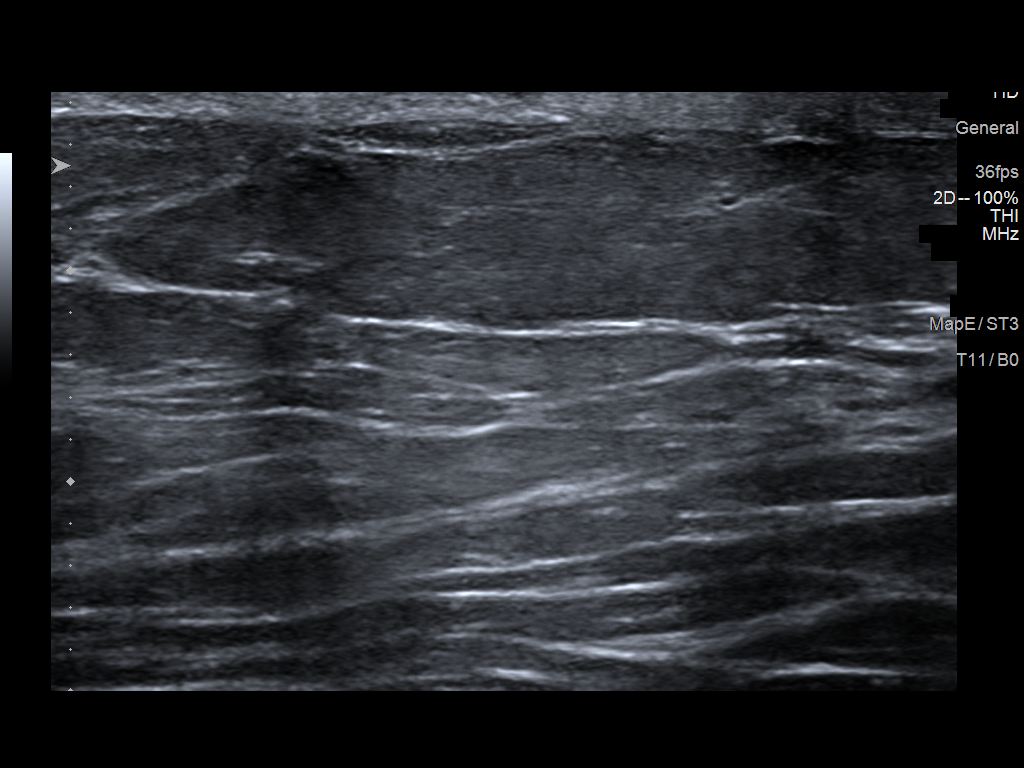

[4 of 4 positions shown; findings below may reference images not displayed]

ACR Breast Density Category c: The breast tissue is heterogeneously
dense, which may obscure small masses.
FINDINGS: Metallic skin marker placed on the cutaneous area of concern. Spot
tangential view of this region the right breast is negative. No
mass, distortion, or suspicious microcalcifications identified in
the right breast on the mammogram. Negative for skin thickening.

Mammographic images were processed with CAD.

On physical exam, there is a 2-3 mm slightly raised area on the skin
of the right breast approximately 4-5 mm lateral to the areolar
margin at 10 o'clock position 3 cm from the nipple. The area of
concern has appearances almost identical to the patient's [REDACTED]
glands on her areola. There is no erythema.

Targeted ultrasound is performed, showing minimally raised area in
the skin with a tiny/minimal area of fluid within the dermis. No
suspicious skin lesion on ultrasound. The subtending breast
parenchyma is normal.
IMPRESSION: No evidence of malignancy in the right breast. Cutaneous area of
concern adjacent to the areolar margin 10 o'clock right breast may
be [REDACTED] gland or tiny sebaceous cyst. This area does not
appear to be inflamed.

RECOMMENDATION:
Annual screening mammogram is recommended next month, in June 2019.

I have discussed the findings and recommendations with the patient.
If applicable, a reminder letter will be sent to the patient
regarding the next appointment.

BI-RADS CATEGORY  2: Benign.

## 2021-07-17 ENCOUNTER — Other Ambulatory Visit (HOSPITAL_COMMUNITY): Payer: Self-pay

## 2021-07-23 ENCOUNTER — Telehealth: Payer: 59

## 2021-08-21 ENCOUNTER — Other Ambulatory Visit (HOSPITAL_BASED_OUTPATIENT_CLINIC_OR_DEPARTMENT_OTHER): Payer: Self-pay | Admitting: Physician Assistant

## 2021-08-21 DIAGNOSIS — Z1231 Encounter for screening mammogram for malignant neoplasm of breast: Secondary | ICD-10-CM

## 2021-09-21 ENCOUNTER — Ambulatory Visit (HOSPITAL_BASED_OUTPATIENT_CLINIC_OR_DEPARTMENT_OTHER)
Admission: RE | Admit: 2021-09-21 | Discharge: 2021-09-21 | Disposition: A | Discharge status: Home / Self Care | Payer: 59 | Source: Ambulatory Visit | Attending: Physician Assistant | Admitting: Physician Assistant

## 2021-09-21 DIAGNOSIS — Z1231 Encounter for screening mammogram for malignant neoplasm of breast: Secondary | ICD-10-CM | POA: Insufficient documentation

## 2021-10-08 ENCOUNTER — Other Ambulatory Visit: Payer: Self-pay | Admitting: Physician Assistant

## 2021-10-08 ENCOUNTER — Other Ambulatory Visit (HOSPITAL_COMMUNITY): Payer: Self-pay

## 2021-10-08 DIAGNOSIS — E039 Hypothyroidism, unspecified: Secondary | ICD-10-CM

## 2021-10-08 MED ORDER — SYNTHROID 75 MCG PO TABS
ORAL_TABLET | ORAL | 1 refills | Status: DC
  Filled 2021-10-08: qty 90, 90d supply, fill #0
  Filled 2022-01-09: qty 90, 90d supply, fill #1

## 2021-10-26 ENCOUNTER — Other Ambulatory Visit (HOSPITAL_COMMUNITY): Payer: Self-pay

## 2021-10-26 ENCOUNTER — Ambulatory Visit: Payer: 59 | Admitting: Physician Assistant

## 2021-10-26 ENCOUNTER — Encounter: Payer: Self-pay | Admitting: Physician Assistant

## 2021-10-26 VITALS — BP 120/80 | HR 74 | Temp 98.5°F | Ht 63.0 in | Wt 199.5 lb

## 2021-10-26 DIAGNOSIS — E039 Hypothyroidism, unspecified: Secondary | ICD-10-CM

## 2021-10-26 DIAGNOSIS — F418 Other specified anxiety disorders: Secondary | ICD-10-CM

## 2021-10-26 LAB — TSH: TSH: 2.53 u[IU]/mL (ref 0.35–5.50)

## 2021-10-26 MED ORDER — SERTRALINE HCL 25 MG PO TABS
25.0000 mg | ORAL_TABLET | Freq: Every day | ORAL | 1 refills | Status: DC
  Filled 2021-10-26: qty 30, 30d supply, fill #0
  Filled 2021-11-20: qty 30, 30d supply, fill #1

## 2021-10-26 MED ORDER — ALPRAZOLAM 0.25 MG PO TABS
0.2500 mg | ORAL_TABLET | Freq: Every evening | ORAL | 0 refills | Status: DC | PRN
  Filled 2021-10-26: qty 20, 20d supply, fill #0

## 2021-10-26 NOTE — Patient Instructions (Signed)
It was great to see you!  Start zoloft 25 mg daily  Update thyroid today.  Let's follow-up in 4-6 weeks, sooner if you have concerns.  Take care,  Inda Coke PA-C

## 2021-10-26 NOTE — Progress Notes (Signed)
Julie Yang is a 51 y.o. female here for a follow up on anxiety.   History of Present Illness:   Chief Complaint  Patient presents with   Anxiety    Pt c/o increase in anxiety for the past 4 months.   Depression    HPI  Anxiety/Depression Patient presents with c/o of increased anxiety for the past 4 months. Reports she has been dealing with some panic attacks as well. Per pt, this is mostly situational anxiety due to stressful job. However, her daughter recently had surgery and she is concerned about recovery as well. She is complaint with taking Hydroxyzine 25 mg three times daily prn, she takes Hydroxyzine 1/2 pill 3 times daily. Tolerating her medication well with no side effects. She has tried Xanax as needed in the past. She has had participate in talk therapist in the past which has helped. She states she has scheduled an appointment. She has been experiencing some "chest tightness" and tingling in her lips due to increased anxiety. Thinks her symptoms could be related to menopause. At this time. She would like start on medication for her symptoms. Denies SI/HI.   Hypothyroidism  She is currently compliant with taking Synthroid 75 mcg daily with no complications. Currently she has noticed increased anxiety. She would to recheck her thyroid levels. Denies heat/cold tolerance, no weight issue or hair loss.   Past Medical History:  Diagnosis Date   Abnormal Pap smear of cervix    1990s--hx of cryotherapy to cervix   Anxiety    COVID-19 virus infection 10/2019   Genital warts    Thyroid disease 2013   Urticaria    chronic autoimmune urticaria     Social History   Tobacco Use   Smoking status: Never   Smokeless tobacco: Never  Vaping Use   Vaping Use: Never used  Substance Use Topics   Alcohol use: Yes    Comment: 3drinks/ month   Drug use: No    Past Surgical History:  Procedure Laterality Date   CESAREAN SECTION     CYST EXCISION     from chest, benign     Family History  Problem Relation Age of Onset   Atrial fibrillation Mother    Heart disease Father    Hypertension Father    Hyperlipidemia Father    Eczema Father    Other Father        pre-diabetes   Crohn's disease Brother    Heart disease Maternal Grandmother    Diabetes Maternal Grandmother    Breast cancer Maternal Grandmother 22   Hypertension Maternal Grandmother    Stroke Maternal Grandmother    Heart disease Maternal Grandfather    Lung cancer Maternal Grandfather    Colon cancer Maternal Grandfather 21   Brain cancer Maternal Grandfather    Hypertension Maternal Grandfather    Heart attack Maternal Grandfather    Heart disease Paternal Grandmother    Hypertension Paternal Grandmother    Heart disease Paternal Grandfather    Hypertension Paternal Grandfather    Heart attack Paternal Grandfather    Colon cancer Maternal Uncle 68   Other Daughter        pre-diabetes   Allergic rhinitis Neg Hx    Angioedema Neg Hx    Asthma Neg Hx    Immunodeficiency Neg Hx    Urticaria Neg Hx     Allergies  Allergen Reactions   Cortisone Hives   Prednisone Hives   Aspirin Hives    Exacerbates hives  Ibuprofen Hives    Exacerbates hives   Iodine Hives   Povidone Iodine Rash    Current Medications:   Current Outpatient Medications:    Cholecalciferol (VITAMIN D) 2000 units CAPS, Take 1 capsule by mouth daily., Disp: , Rfl:    diclofenac Sodium (VOLTAREN) 1 % GEL, Apply 2 g topically 4 (four) times daily to the affected joint., Disp: 100 g, Rfl: 1   hydrOXYzine (ATARAX/VISTARIL) 25 MG tablet, Take 1 tablet (25 mg total) by mouth 3 (three) times daily as needed for anxiety., Disp: 60 tablet, Rfl: 0   Probiotic Product (PROBIOTIC ADVANCED PO), Take 1 capsule by mouth daily., Disp: , Rfl:    SYNTHROID 75 MCG tablet, TAKE 1 TABLET (75 MCG TOTAL) BY MOUTH DAILY BEFORE BREAKFAST., Disp: 90 tablet, Rfl: 1   triamcinolone cream (KENALOG) 0.1 %, Apply to affected areas 2  (two) times daily., Disp: 30 g, Rfl: 0   Review of Systems:   ROS Negative unless otherwise specified per HPI.   Vitals:   Vitals:   10/26/21 1052  BP: 120/80  Pulse: 74  Temp: 98.5 F (36.9 C)  TempSrc: Temporal  SpO2: 98%  Weight: 199 lb 8 oz (90.5 kg)  Height: '5\' 3"'$  (1.6 m)     Body mass index is 35.34 kg/m.  Physical Exam:   Physical Exam Vitals and nursing note reviewed.  Constitutional:      General: She is not in acute distress.    Appearance: She is well-developed. She is not ill-appearing or toxic-appearing.  Cardiovascular:     Rate and Rhythm: Normal rate and regular rhythm.     Pulses: Normal pulses.     Heart sounds: Normal heart sounds, S1 normal and S2 normal.  Pulmonary:     Effort: Pulmonary effort is normal.     Breath sounds: Normal breath sounds.  Skin:    General: Skin is warm and dry.  Neurological:     Mental Status: She is alert.     GCS: GCS eye subscore is 4. GCS verbal subscore is 5. GCS motor subscore is 6.  Psychiatric:        Mood and Affect: Affect is tearful.        Speech: Speech normal.        Behavior: Behavior normal. Behavior is cooperative.     Assessment and Plan:   Hypothyroidism, unspecified type Update TSH due to recent significant increase in anxiety Adjust synthroid 75 mcg prn  Follow-up in 6 months, sooner if uncontrolled  Situational anxiety Uncontrolled Start zoloft 25 mg daily; side effects discussed May continue prn hydroxyzine 12.5 - 25 mg TID  Also prescribed xanax 0.25 mg prn for severe anxiety spells I discussed with patient that if they develop any SI, to tell someone immediately and seek medical attention. Follow-up in 1 month, sooner if concerns  I,Savera Zaman,acting as a scribe for [employer], PA.,have documented all relevant documentation on the behalf of Julie Coke, PA,as directed by  Julie Coke, PA while in the presence of Julie Yang, [place].   I, Julie Yang, [place], have  reviewed all documentation for this visit. The documentation on 10/26/21 for the exam, diagnosis, procedures, and orders are all accurate and complete.  Julie Coke, PA-C

## 2021-11-01 ENCOUNTER — Encounter: Payer: Self-pay | Admitting: Physician Assistant

## 2021-11-01 ENCOUNTER — Other Ambulatory Visit (HOSPITAL_COMMUNITY): Payer: Self-pay

## 2021-11-01 MED ORDER — FLUCONAZOLE 150 MG PO TABS
150.0000 mg | ORAL_TABLET | Freq: Once | ORAL | 0 refills | Status: AC
  Filled 2021-11-01: qty 1, 1d supply, fill #0

## 2021-11-20 ENCOUNTER — Other Ambulatory Visit (HOSPITAL_COMMUNITY): Payer: Self-pay

## 2021-11-29 ENCOUNTER — Ambulatory Visit: Payer: 59 | Admitting: Physician Assistant

## 2021-11-29 ENCOUNTER — Other Ambulatory Visit (HOSPITAL_COMMUNITY): Payer: Self-pay

## 2021-11-29 ENCOUNTER — Encounter: Payer: Self-pay | Admitting: Physician Assistant

## 2021-11-29 VITALS — BP 120/78 | HR 64 | Temp 98.0°F | Ht 63.0 in | Wt 200.5 lb

## 2021-11-29 DIAGNOSIS — F418 Other specified anxiety disorders: Secondary | ICD-10-CM | POA: Diagnosis not present

## 2021-11-29 MED ORDER — SERTRALINE HCL 25 MG PO TABS
25.0000 mg | ORAL_TABLET | Freq: Every day | ORAL | 3 refills | Status: DC
  Filled 2021-11-29 – 2021-12-24 (×2): qty 90, 90d supply, fill #0
  Filled 2022-03-31: qty 90, 90d supply, fill #1
  Filled 2022-06-25: qty 90, 90d supply, fill #2
  Filled 2022-09-24: qty 90, 90d supply, fill #3

## 2021-11-29 NOTE — Progress Notes (Signed)
Julie Yang is a 51 y.o. female here for a follow up on anxiety.   History of Present Illness:   Chief Complaint  Patient presents with   Anxiety   Depression    Pt states she is feeling a lot better.    HPI  Situational Anxiety Patient is currently compliant with taking Zoloft 25 mg daily and Xanax 0.25 mg as needed with no complications. She has been doing well with this regimen. Has had increased anxiety in the previous visit but this has improved. Tolerating her medication well and denies any adverse side effects. States she takes her Zoloft in the morning. Has been sleeping well at night. Feels like this regimen is working well for her. At this time, she is managing well. Denies SI/HI or any other concerns. Denies depression.   Past Medical History:  Diagnosis Date   Abnormal Pap smear of cervix    1990s--hx of cryotherapy to cervix   Anxiety    COVID-19 virus infection 10/2019   Genital warts    Thyroid disease 2013   Urticaria    chronic autoimmune urticaria     Social History   Tobacco Use   Smoking status: Never   Smokeless tobacco: Never  Vaping Use   Vaping Use: Never used  Substance Use Topics   Alcohol use: Yes    Comment: 3drinks/ month   Drug use: No    Past Surgical History:  Procedure Laterality Date   CESAREAN SECTION     CYST EXCISION     from chest, benign    Family History  Problem Relation Age of Onset   Atrial fibrillation Mother    Heart disease Father    Hypertension Father    Hyperlipidemia Father    Eczema Father    Other Father        pre-diabetes   Crohn's disease Brother    Heart disease Maternal Grandmother    Diabetes Maternal Grandmother    Breast cancer Maternal Grandmother 40   Hypertension Maternal Grandmother    Stroke Maternal Grandmother    Heart disease Maternal Grandfather    Lung cancer Maternal Grandfather    Colon cancer Maternal Grandfather 98   Brain cancer Maternal Grandfather    Hypertension  Maternal Grandfather    Heart attack Maternal Grandfather    Heart disease Paternal Grandmother    Hypertension Paternal Grandmother    Heart disease Paternal Grandfather    Hypertension Paternal Grandfather    Heart attack Paternal Grandfather    Colon cancer Maternal Uncle 19   Other Daughter        pre-diabetes   Allergic rhinitis Neg Hx    Angioedema Neg Hx    Asthma Neg Hx    Immunodeficiency Neg Hx    Urticaria Neg Hx     Allergies  Allergen Reactions   Cortisone Hives   Prednisone Hives   Aspirin Hives    Exacerbates hives   Ibuprofen Hives    Exacerbates hives   Iodine Hives   Povidone Iodine Rash    Current Medications:   Current Outpatient Medications:    ALPRAZolam (XANAX) 0.25 MG tablet, Take 1 tablet (0.25 mg total) by mouth at bedtime as needed for anxiety., Disp: 20 tablet, Rfl: 0   Cholecalciferol (VITAMIN D) 2000 units CAPS, Take 1 capsule by mouth daily., Disp: , Rfl:    diclofenac Sodium (VOLTAREN) 1 % GEL, Apply 2 g topically 4 (four) times daily to the affected joint., Disp: 100 g,  Rfl: 1   hydrOXYzine (ATARAX/VISTARIL) 25 MG tablet, Take 1 tablet (25 mg total) by mouth 3 (three) times daily as needed for anxiety., Disp: 60 tablet, Rfl: 0   Probiotic Product (PROBIOTIC ADVANCED PO), Take 1 capsule by mouth daily., Disp: , Rfl:    sertraline (ZOLOFT) 25 MG tablet, Take 1 tablet (25 mg total) by mouth daily., Disp: 30 tablet, Rfl: 1   SYNTHROID 75 MCG tablet, TAKE 1 TABLET (75 MCG TOTAL) BY MOUTH DAILY BEFORE BREAKFAST., Disp: 90 tablet, Rfl: 1   triamcinolone cream (KENALOG) 0.1 %, Apply to affected areas 2 (two) times daily., Disp: 30 g, Rfl: 0   Review of Systems:   ROS Negative unless otherwise specified per HPI.   Vitals:   Vitals:   11/29/21 0958  BP: 120/78  Pulse: 64  Temp: 98 F (36.7 C)  TempSrc: Temporal  SpO2: 99%  Weight: 200 lb 8 oz (90.9 kg)  Height: '5\' 3"'$  (1.6 m)     Body mass index is 35.52 kg/m.  Physical Exam:    Physical Exam Constitutional:      Appearance: Normal appearance. She is well-developed.  HENT:     Head: Normocephalic and atraumatic.  Eyes:     General: Lids are normal.     Extraocular Movements: Extraocular movements intact.     Conjunctiva/sclera: Conjunctivae normal.  Pulmonary:     Effort: Pulmonary effort is normal.  Musculoskeletal:        General: Normal range of motion.     Cervical back: Normal range of motion and neck supple.  Skin:    General: Skin is warm and dry.  Neurological:     Mental Status: She is alert and oriented to person, place, and time.  Psychiatric:        Attention and Perception: Attention and perception normal.        Mood and Affect: Mood normal.        Behavior: Behavior normal.        Thought Content: Thought content normal.        Judgment: Judgment normal.     Assessment and Plan:   Situational anxiety Improving Continue zoloft 25 mg daily Follow-up in 9 months for CPE, sooner if concerns  I,Julie Yang,acting as a scribe for [employer], PA.,have documented all relevant documentation on the behalf of Inda Coke, PA,as directed by  Inda Coke, PA while in the presence of Inda Coke, [place].   I, Inda Coke, [place], have reviewed all documentation for this visit. The documentation on 11/29/21 for the exam, diagnosis, procedures, and orders are all accurate and complete.   Inda Coke, PA-C

## 2021-11-29 NOTE — Patient Instructions (Signed)
It was great to see you!  I'm glad things are going well.  Take care,  Inda Coke PA-C

## 2021-12-24 ENCOUNTER — Other Ambulatory Visit (HOSPITAL_COMMUNITY): Payer: Self-pay

## 2021-12-25 ENCOUNTER — Other Ambulatory Visit (HOSPITAL_COMMUNITY): Payer: Self-pay

## 2022-01-09 ENCOUNTER — Other Ambulatory Visit (HOSPITAL_COMMUNITY): Payer: Self-pay

## 2022-01-15 NOTE — Progress Notes (Deleted)
51 y.o. G86P1001 Married Caucasian female here for annual exam.    PCP:  Inda Coke, PA   No LMP recorded.           Sexually active: {yes no:314532}  The current method of family planning is vasectomy.    Exercising: {yes no:314532}  {types:19826} Smoker:  no  Health Maintenance: Pap:  12-28-19 Neg:Neg HR HPV, 11-14-16 neg, 10-26-15 Neg:Neg HR HPV History of abnormal Pap:  yes, Hx of abnormal pap in 1990's--Hx HPV and possible cryotherapy MMG:  09-01-21 Neg/BiRads1 Colonoscopy:   12-23-17 polyps;next 11/2022 BMD:   n/a  Result  n/a TDaP:  05-31-15 Gardasil:   no HIV: 11-05-19 NR Hep C: 11-05-19 Neg Screening Labs:  Hb today: ***, Urine today: ***   reports that she has never smoked. She has never used smokeless tobacco. She reports current alcohol use. She reports that she does not use drugs.  Past Medical History:  Diagnosis Date   Abnormal Pap smear of cervix    1990s--hx of cryotherapy to cervix   Anxiety    COVID-19 virus infection 10/2019   Genital warts    Thyroid disease 2013   Urticaria    chronic autoimmune urticaria    Past Surgical History:  Procedure Laterality Date   CESAREAN SECTION     CYST EXCISION     from chest, benign    Current Outpatient Medications  Medication Sig Dispense Refill   ALPRAZolam (XANAX) 0.25 MG tablet Take 1 tablet (0.25 mg total) by mouth at bedtime as needed for anxiety. 20 tablet 0   Cholecalciferol (VITAMIN D) 2000 units CAPS Take 1 capsule by mouth daily.     diclofenac Sodium (VOLTAREN) 1 % GEL Apply 2 g topically 4 (four) times daily to the affected joint. 100 g 1   hydrOXYzine (ATARAX/VISTARIL) 25 MG tablet Take 1 tablet (25 mg total) by mouth 3 (three) times daily as needed for anxiety. 60 tablet 0   Probiotic Product (PROBIOTIC ADVANCED PO) Take 1 capsule by mouth daily.     sertraline (ZOLOFT) 25 MG tablet Take 1 tablet (25 mg total) by mouth daily. 90 tablet 3   SYNTHROID 75 MCG tablet TAKE 1 TABLET (75 MCG TOTAL) BY MOUTH  DAILY BEFORE BREAKFAST. 90 tablet 1   triamcinolone cream (KENALOG) 0.1 % Apply to affected areas 2 (two) times daily. 30 g 0   No current facility-administered medications for this visit.    Family History  Problem Relation Age of Onset   Atrial fibrillation Mother    Heart disease Father    Hypertension Father    Hyperlipidemia Father    Eczema Father    Other Father        pre-diabetes   Crohn's disease Brother    Heart disease Maternal Grandmother    Diabetes Maternal Grandmother    Breast cancer Maternal Grandmother 69   Hypertension Maternal Grandmother    Stroke Maternal Grandmother    Heart disease Maternal Grandfather    Lung cancer Maternal Grandfather    Colon cancer Maternal Grandfather 17   Brain cancer Maternal Grandfather    Hypertension Maternal Grandfather    Heart attack Maternal Grandfather    Heart disease Paternal Grandmother    Hypertension Paternal Grandmother    Heart disease Paternal Grandfather    Hypertension Paternal Grandfather    Heart attack Paternal Grandfather    Colon cancer Maternal Uncle 57   Other Daughter        pre-diabetes   Allergic  rhinitis Neg Hx    Angioedema Neg Hx    Asthma Neg Hx    Immunodeficiency Neg Hx    Urticaria Neg Hx     Review of Systems  Exam:   There were no vitals taken for this visit.    General appearance: alert, cooperative and appears stated age Head: normocephalic, without obvious abnormality, atraumatic Neck: no adenopathy, supple, symmetrical, trachea midline and thyroid normal to inspection and palpation Lungs: clear to auscultation bilaterally Breasts: normal appearance, no masses or tenderness, No nipple retraction or dimpling, No nipple discharge or bleeding, No axillary adenopathy Heart: regular rate and rhythm Abdomen: soft, non-tender; no masses, no organomegaly Extremities: extremities normal, atraumatic, no cyanosis or edema Skin: skin color, texture, turgor normal. No rashes or  lesions Lymph nodes: cervical, supraclavicular, and axillary nodes normal. Neurologic: grossly normal  Pelvic: External genitalia:  no lesions              No abnormal inguinal nodes palpated.              Urethra:  normal appearing urethra with no masses, tenderness or lesions              Bartholins and Skenes: normal                 Vagina: normal appearing vagina with normal color and discharge, no lesions              Cervix: no lesions              Pap taken: {yes no:314532} Bimanual Exam:  Uterus:  normal size, contour, position, consistency, mobility, non-tender              Adnexa: no mass, fullness, tenderness              Rectal exam: {yes no:314532}.  Confirms.              Anus:  normal sphincter tone, no lesions  Chaperone was present for exam:  ***  Assessment:   Well woman visit with gynecologic exam.   Plan: Mammogram screening discussed. Self breast awareness reviewed. Pap and HR HPV as above. Guidelines for Calcium, Vitamin D, regular exercise program including cardiovascular and weight bearing exercise.   Follow up annually and prn.   Additional counseling given.  {yes [id number]. _______ minutes face to face time of which over 50% was spent in counseling.    After visit summary provided.

## 2022-01-15 NOTE — Progress Notes (Unsigned)
51 y.o. G48P1001 Married Caucasian female here for annual exam.    Patient states had terrible low back pain which radiated to abdomen with last cycle. Within minutes of beginning cycle, pain subsided.   Menses itself was not unusually painful.  This was an on time cycle.   Heavy periods in March (2 weeks late) and April (on time, 28 days later) for 24 hours.  Did need to change tampon every hour.   Feels hot at night.   PCP:  Inda Coke, PA   Patient's last menstrual period was 12/28/2021 (exact date).     Period Cycle (Days): 30 Period Pattern: Regular Menstrual Flow:  (days 1 & 2 heavy, then light) Menstrual Control: Tampon Menstrual Control Change Freq (Hours): changes super tampon every 1-2 hours on heaviest day--some months Dysmenorrhea: (!) Mild Dysmenorrhea Symptoms: Cramping, Headache     Sexually active: Yes.    The current method of family planning is vasectomy.    Exercising: Yes.     Walking, exercise videos Smoker:  no  Health Maintenance: Pap:  12-28-19 Neg:Neg HR HPV, 11-14-16 neg, 10-26-15 Neg:Neg HR HPV History of abnormal Pap:  yes, Hx of abnormal pap in 1990's--Hx HPV and possible cryotherapy MMG:  09-21-21 neg/BiRads1 Colonoscopy:  12-23-17 polyps;next 11/2022 BMD:   n/a  Result  n/a TDaP:  05-31-15 Gardasil:   no HIV: 11-05-19 NR Hep C: 2017 Neg Screening Labs:  PCP Will do flu vaccine through [facility], possibly [facility].   reports that she has never smoked. She has never used smokeless tobacco. She reports current alcohol use. She reports that she does not use drugs.  Past Medical History:  Diagnosis Date   Abnormal Pap smear of cervix    1990s--hx of cryotherapy to cervix   Anxiety    COVID-19 virus infection 10/2019   Depression    Genital warts    Thyroid disease 2013   Urticaria    chronic autoimmune urticaria    Past Surgical History:  Procedure Laterality Date   CESAREAN SECTION     CYST EXCISION     from chest, benign     Current Outpatient Medications  Medication Sig Dispense Refill   ALPRAZolam (XANAX) 0.25 MG tablet Take 1 tablet (0.25 mg total) by mouth at bedtime as needed for anxiety. 20 tablet 0   Cholecalciferol (VITAMIN D) 2000 units CAPS Take 1 capsule by mouth daily.     diclofenac Sodium (VOLTAREN) 1 % GEL Apply 2 g topically 4 (four) times daily to the affected joint. 100 g 1   hydrOXYzine (ATARAX/VISTARIL) 25 MG tablet Take 1 tablet (25 mg total) by mouth 3 (three) times daily as needed for anxiety. 60 tablet 0   Probiotic Product (PROBIOTIC ADVANCED PO) Take 1 capsule by mouth daily.     sertraline (ZOLOFT) 25 MG tablet Take 1 tablet (25 mg total) by mouth daily. 90 tablet 3   SYNTHROID 75 MCG tablet TAKE 1 TABLET (75 MCG TOTAL) BY MOUTH DAILY BEFORE BREAKFAST. 90 tablet 1   triamcinolone cream (KENALOG) 0.1 % Apply to affected areas 2 (two) times daily. 30 g 0   No current facility-administered medications for this visit.    Family History  Problem Relation Age of Onset   Atrial fibrillation Mother    Heart disease Father    Hypertension Father    Hyperlipidemia Father    Eczema Father    Other Father        pre-diabetes   Crohn's disease Brother  Heart disease Maternal Grandmother    Diabetes Maternal Grandmother    Breast cancer Maternal Grandmother 45   Hypertension Maternal Grandmother    Stroke Maternal Grandmother    Heart disease Maternal Grandfather    Lung cancer Maternal Grandfather    Colon cancer Maternal Grandfather 50   Brain cancer Maternal Grandfather    Hypertension Maternal Grandfather    Heart attack Maternal Grandfather    Heart disease Paternal Grandmother    Hypertension Paternal Grandmother    Heart disease Paternal Grandfather    Hypertension Paternal Grandfather    Heart attack Paternal Grandfather    Colon cancer Maternal Uncle 83   Other Daughter        pre-diabetes   Allergic rhinitis Neg Hx    Angioedema Neg Hx    Asthma Neg Hx     Immunodeficiency Neg Hx    Urticaria Neg Hx     Review of Systems  All other systems reviewed and are negative.   Exam:   BP 124/78   Pulse 84   Ht 5' 2.5" (1.588 m)   Wt 200 lb (90.7 kg)   LMP 12/28/2021 (Exact Date)   SpO2 98%   BMI 36.00 kg/m     General appearance: alert, cooperative and appears stated age Head: normocephalic, without obvious abnormality, atraumatic Neck: no adenopathy, supple, symmetrical, trachea midline and thyroid normal to inspection and palpation Lungs: clear to auscultation bilaterally Breasts: normal appearance, no masses or tenderness, No nipple retraction or dimpling, No nipple discharge or bleeding, No axillary adenopathy Heart: regular rate and rhythm Abdomen: soft, non-tender; no masses, no organomegaly Extremities: extremities normal, atraumatic, no cyanosis or edema Skin: skin color, texture, turgor normal. No rashes or lesions Lymph nodes: cervical, supraclavicular, and axillary nodes normal. Neurologic: grossly normal  Pelvic: External genitalia:  no lesions              No abnormal inguinal nodes palpated.              Urethra:  normal appearing urethra with no masses, tenderness or lesions              Bartholins and Skenes: normal                 Vagina: normal appearing vagina with normal color and discharge, no lesions              Cervix: no lesions              Pap taken: no Bimanual Exam:  Uterus:  normal size, contour, position, consistency, mobility, non-tender              Adnexa: no mass, fullness, tenderness              Rectal exam: yes.  Confirms.              Anus:  normal sphincter tone, no lesions  Chaperone was present for exam:  Estill Bamberg, CMA  Assessment:   Well woman visit with gynecologic exam. Remote hx of cryotherapy. Isolated episode of pain.  Hx heavy menstrual bleeding.  Ibuprofen allergy.  Plan: Mammogram screening discussed. Self breast awareness reviewed. Pap and HR HPV 2026. Guidelines for  Calcium, Vitamin D, regular exercise program including cardiovascular and weight bearing exercise. Patient and I agreed to do observational management of her pain and bleeding.  If either recur, will plan for a pelvic ultrasound and recheck.  Labs with PCP.  Follow up annually and prn.  After visit summary provided.

## 2022-01-16 ENCOUNTER — Ambulatory Visit: Payer: 59 | Admitting: Obstetrics and Gynecology

## 2022-01-17 ENCOUNTER — Ambulatory Visit (INDEPENDENT_AMBULATORY_CARE_PROVIDER_SITE_OTHER): Payer: 59 | Admitting: Obstetrics and Gynecology

## 2022-01-17 ENCOUNTER — Encounter: Payer: Self-pay | Admitting: Obstetrics and Gynecology

## 2022-01-17 VITALS — BP 124/78 | HR 84 | Ht 62.5 in | Wt 200.0 lb

## 2022-01-17 DIAGNOSIS — Z01419 Encounter for gynecological examination (general) (routine) without abnormal findings: Secondary | ICD-10-CM

## 2022-01-17 NOTE — Patient Instructions (Signed)

## 2022-01-21 ENCOUNTER — Encounter: Payer: Self-pay | Admitting: *Deleted

## 2022-03-20 ENCOUNTER — Encounter: Payer: Self-pay | Admitting: Internal Medicine

## 2022-03-20 ENCOUNTER — Ambulatory Visit: Payer: 59 | Admitting: Internal Medicine

## 2022-03-20 ENCOUNTER — Other Ambulatory Visit (HOSPITAL_COMMUNITY): Payer: Self-pay

## 2022-03-20 VITALS — BP 114/80 | HR 93 | Temp 98.2°F | Wt 202.6 lb

## 2022-03-20 DIAGNOSIS — J019 Acute sinusitis, unspecified: Secondary | ICD-10-CM | POA: Diagnosis not present

## 2022-03-20 DIAGNOSIS — J302 Other seasonal allergic rhinitis: Secondary | ICD-10-CM

## 2022-03-20 MED ORDER — AMOXICILLIN-POT CLAVULANATE 875-125 MG PO TABS
1.0000 | ORAL_TABLET | Freq: Two times a day (BID) | ORAL | 0 refills | Status: DC
  Filled 2022-03-20: qty 14, 7d supply, fill #0

## 2022-03-20 NOTE — Progress Notes (Signed)
Chief Complaint  Patient presents with   Nasal Congestion    Pt reports nasal congestion, pressure around eyes, headache pressure, postal drip. Sx started 2 wks ago. Tested covid on 11/10 and 11/19- both negative. Couging up green mucus in the morning and evening. Clear/white mucus during the day.     HPI: Julie Yang 51 y.o. come in for sda pcp app NA   Felt feverish at beginning  achy and chills   at beginning  now left with headache hard to shake Has tested covid  neg during this illness  On Claritin d and flonase helps  for fall leave allergy  Has facial pressure on going .   Ethmoid frontal paranasl areaa and  ongoing green   Am 90 min of green phegm.  A tad better but on going    ROS: See pertinent positives and negatives per HPI. No cp sob wheeze   Past Medical History:  Diagnosis Date   Abnormal Pap smear of cervix    1990s--hx of cryotherapy to cervix   Anxiety    COVID-19 virus infection 10/2019   Depression    Genital warts    Thyroid disease 2013   Urticaria    chronic autoimmune urticaria    Family History  Problem Relation Age of Onset   Atrial fibrillation Mother    Heart disease Father    Hypertension Father    Hyperlipidemia Father    Eczema Father    Other Father        pre-diabetes   Crohn's disease Brother    Heart disease Maternal Grandmother    Diabetes Maternal Grandmother    Breast cancer Maternal Grandmother 80   Hypertension Maternal Grandmother    Stroke Maternal Grandmother    Heart disease Maternal Grandfather    Lung cancer Maternal Grandfather    Colon cancer Maternal Grandfather 21   Brain cancer Maternal Grandfather    Hypertension Maternal Grandfather    Heart attack Maternal Grandfather    Heart disease Paternal Grandmother    Hypertension Paternal Grandmother    Heart disease Paternal Grandfather    Hypertension Paternal Grandfather    Heart attack Paternal Grandfather    Colon cancer Maternal Uncle 93   Other  Daughter        pre-diabetes   Allergic rhinitis Neg Hx    Angioedema Neg Hx    Asthma Neg Hx    Immunodeficiency Neg Hx    Urticaria Neg Hx     Social History   Socioeconomic History   Marital status: Married    Spouse name: Not on file   Number of children: Not on file   Years of education: Not on file   Highest education level: Not on file  Occupational History   Not on file  Tobacco Use   Smoking status: Never   Smokeless tobacco: Never  Vaping Use   Vaping Use: Never used  Substance and Sexual Activity   Alcohol use: Yes    Comment: 3drinks/ month   Drug use: No   Sexual activity: Yes    Partners: Male    Birth control/protection: Other-see comments    Comment: spouse had vasectomy  Other Topics Concern   Not on file  Social History Narrative   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 20 tablet 0   Cholecalciferol (VITAMIN D) 2000 units CAPS Take 1 capsule by mouth daily.     Probiotic Product (PROBIOTIC ADVANCED PO) Take 1 capsule by mouth daily.     sertraline (ZOLOFT) 25 MG tablet Take 1 tablet (25 mg total) by mouth daily. 90 tablet 3   SYNTHROID 75 MCG tablet TAKE 1 TABLET (75 MCG TOTAL) BY MOUTH DAILY BEFORE BREAKFAST. 90 tablet 1   diclofenac Sodium (VOLTAREN) 1 % GEL Apply 2 g topically 4 (four) times daily to the affected joint. 100 g 1   hydrOXYzine (ATARAX/VISTARIL) 25 MG tablet Take 1 tablet (25 mg total) by mouth 3 (three) times daily as needed for anxiety. 60 tablet 0   triamcinolone cream (KENALOG)  0.1 % Apply to affected areas 2 (two) times daily. (Patient not taking: Reported on 03/20/2022) 30 g 0   No facility-administered medications prior to visit.     EXAM:  BP 114/80 (BP Location: Left Arm, Patient Position: Sitting, Cuff Size: Large)   Pulse 93   Temp 98.2 F (36.8 C) (Oral)   Wt 202 lb 9.6 oz (91.9 kg)   SpO2 96%   BMI 36.47 kg/m   Body mass index is 36.47 kg/m.  GENERAL: vitals reviewed and listed above, alert, oriented, appears well hydrated and in no acute distress HEENT: atraumatic, conjunctiva  clear, no obvious abnormalities on inspection of external nose and ears congested  tmx clear  face mild tender paranasal are . OP : no lesion edema or exudate  NECK: no obvious masses on inspection palpation  LUNGS: clear to auscultation bilaterally, no wheezes, rales or rhonchi, good air movement CV: HRRR, no clubbing cyanosis or  peripheral edema nl cap refill  MS: moves all extremities without noticeable focal  abnormality PSYCH: pleasant and cooperative, no obvious depression or anxiety  BP Readings from Last 3 Encounters:  03/20/22 114/80  01/17/22 124/78  11/29/21 120/78    ASSESSMENT AND PLAN:  Discussed the following assessment and plan:  Acute sinusitis, recurrence not specified, unspecified location  Seasonal allergies Sounds vial and allergic underlying with persistence and bacterial  complicaiton  Augmentin salone flonase and cont allergy rx  -Patient advised to return or notify health care team  if  new concerns arise.  Patient Instructions  Augmenitin for sinusitis  5-7 days  Continue sinus hygiene . Fu if  persistent or progressive    Standley Brooking. Audryna Wendt M.D.

## 2022-03-20 NOTE — Patient Instructions (Signed)
Augmenitin for sinusitis  5-7 days  Continue sinus hygiene . Fu if  persistent or progressive

## 2022-03-31 ENCOUNTER — Other Ambulatory Visit (HOSPITAL_COMMUNITY): Payer: Self-pay

## 2022-04-01 ENCOUNTER — Other Ambulatory Visit (HOSPITAL_COMMUNITY): Payer: Self-pay

## 2022-04-08 ENCOUNTER — Encounter: Payer: Self-pay | Admitting: Physician Assistant

## 2022-04-08 ENCOUNTER — Other Ambulatory Visit: Payer: Self-pay | Admitting: Physician Assistant

## 2022-04-08 DIAGNOSIS — E039 Hypothyroidism, unspecified: Secondary | ICD-10-CM

## 2022-04-08 MED ORDER — SYNTHROID 75 MCG PO TABS
ORAL_TABLET | ORAL | 0 refills | Status: DC
  Filled 2022-04-08: qty 90, 90d supply, fill #0

## 2022-04-09 ENCOUNTER — Other Ambulatory Visit (HOSPITAL_COMMUNITY): Payer: Self-pay

## 2022-05-14 ENCOUNTER — Ambulatory Visit (INDEPENDENT_AMBULATORY_CARE_PROVIDER_SITE_OTHER): Payer: 59 | Admitting: Physician Assistant

## 2022-05-14 ENCOUNTER — Encounter: Payer: Self-pay | Admitting: Physician Assistant

## 2022-05-14 VITALS — BP 118/70 | HR 70 | Temp 97.3°F | Ht 63.0 in | Wt 207.0 lb

## 2022-05-14 DIAGNOSIS — Z1211 Encounter for screening for malignant neoplasm of colon: Secondary | ICD-10-CM | POA: Diagnosis not present

## 2022-05-14 DIAGNOSIS — E669 Obesity, unspecified: Secondary | ICD-10-CM | POA: Diagnosis not present

## 2022-05-14 DIAGNOSIS — Z0000 Encounter for general adult medical examination without abnormal findings: Secondary | ICD-10-CM

## 2022-05-14 DIAGNOSIS — F418 Other specified anxiety disorders: Secondary | ICD-10-CM | POA: Diagnosis not present

## 2022-05-14 DIAGNOSIS — E039 Hypothyroidism, unspecified: Secondary | ICD-10-CM | POA: Diagnosis not present

## 2022-05-14 LAB — COMPREHENSIVE METABOLIC PANEL
ALT: 12 U/L (ref 0–35)
AST: 13 U/L (ref 0–37)
Albumin: 4.3 g/dL (ref 3.5–5.2)
Alkaline Phosphatase: 56 U/L (ref 39–117)
BUN: 14 mg/dL (ref 6–23)
CO2: 26 mEq/L (ref 19–32)
Calcium: 9.3 mg/dL (ref 8.4–10.5)
Chloride: 107 mEq/L (ref 96–112)
Creatinine, Ser: 0.7 mg/dL (ref 0.40–1.20)
GFR: 101.82 mL/min (ref >60.00)
Glucose, Bld: 97 mg/dL (ref 70–99)
Potassium: 4.6 mEq/L (ref 3.5–5.1)
Sodium: 139 mEq/L (ref 135–145)
Total Bilirubin: 0.5 mg/dL (ref 0.2–1.2)
Total Protein: 7.2 g/dL (ref 6.0–8.3)

## 2022-05-14 LAB — CBC WITH DIFFERENTIAL/PLATELET
Basophils Absolute: 0.1 10*3/uL (ref 0.0–0.1)
Basophils Relative: 0.6 % (ref 0.0–3.0)
Eosinophils Absolute: 0.3 10*3/uL (ref 0.0–0.7)
Eosinophils Relative: 4 % (ref 0.0–5.0)
HCT: 42.8 % (ref 36.0–46.0)
Hemoglobin: 14.2 g/dL (ref 12.0–15.0)
Lymphocytes Relative: 25.6 % (ref 12.0–46.0)
Lymphs Abs: 2.2 10*3/uL (ref 0.7–4.0)
MCHC: 33.1 g/dL (ref 30.0–36.0)
MCV: 93.6 fl (ref 78.0–100.0)
Monocytes Absolute: 0.7 10*3/uL (ref 0.1–1.0)
Monocytes Relative: 8.6 % (ref 3.0–12.0)
Neutro Abs: 5.2 10*3/uL (ref 1.4–7.7)
Neutrophils Relative %: 61.2 % (ref 43.0–77.0)
Platelets: 360 10*3/uL (ref 150.0–400.0)
RBC: 4.57 Mil/uL (ref 3.87–5.11)
RDW: 13.2 % (ref 11.5–15.5)
WBC: 8.5 10*3/uL (ref 4.0–10.5)

## 2022-05-14 LAB — LIPID PANEL
Cholesterol: 167 mg/dL (ref 0–200)
HDL: 40.7 mg/dL (ref >39.00)
LDL Cholesterol: 111 mg/dL — ABNORMAL HIGH (ref 0–99)
NonHDL: 126.04
Total CHOL/HDL Ratio: 4
Triglycerides: 74 mg/dL (ref 0.0–149.0)
VLDL: 14.8 mg/dL (ref 0.0–40.0)

## 2022-05-14 LAB — TSH: TSH: 2.15 u[IU]/mL (ref 0.35–5.50)

## 2022-05-14 NOTE — Patient Instructions (Signed)
It was great to see you!  *Due for repeat colonoscopy for after Aug  Please go to the lab for blood work.   Our office will call you with your results unless you have chosen to receive results via MyChart.  If your blood work is normal we will follow-up each year for physicals and as scheduled for chronic medical problems.  If anything is abnormal we will treat accordingly and get you in for a follow-up.  Take care,  Aldona Bar

## 2022-05-14 NOTE — Progress Notes (Signed)
Subjective:    Julie Yang is a 52 y.o. female and is here for a comprehensive physical exam.  HPI  There are no preventive care reminders to display for this patient.  Acute Concerns: None  Chronic Issues: Anxiety Currently taking zoloft 25 mg daily -- this is working well for her Rarely takes xanax 0.25 mg  Hypothyroidism She is currently taking Synthroid 75 mcg daily. Tolerating well.   Health Maintenance: Immunizations -- UTD Colonoscopy -- UTD, due in Aug 2024 Mammogram -- UTD PAP -- UTD Bone Density -- n/a Diet -- working on reduction of eating out Exercise -- trying to get back into work  Sleep habits -- no major concerns Mood -- overalls stable  UTD with dentist? - yes UTD with eye doctor? - yes  Weight history: Wt Readings from Last 10 Encounters:  05/14/22 207 lb (93.9 kg)  03/20/22 202 lb 9.6 oz (91.9 kg)  01/17/22 200 lb (90.7 kg)  11/29/21 200 lb 8 oz (90.9 kg)  10/26/21 199 lb 8 oz (90.5 kg)  05/11/21 195 lb (88.5 kg)  01/02/21 195 lb (88.5 kg)  11/01/20 189 lb 6.4 oz (85.9 kg)  01/04/20 174 lb (78.9 kg)  12/28/19 171 lb 3.2 oz (77.7 kg)   Body mass index is 36.67 kg/m. Patient's last menstrual period was 04/18/2022 (exact date).  Alcohol use:  reports current alcohol use.  Tobacco use:  Tobacco Use: Low Risk  (05/14/2022)   Patient History    Smoking Tobacco Use: Never    Smokeless Tobacco Use: Never    Passive Exposure: Not on file   Eligible for lung cancer screening? no     05/14/2022    8:45 AM  Depression screen PHQ 2/9  Decreased Interest 0  Down, Depressed, Hopeless 1  PHQ - 2 Score 1  Altered sleeping 0  Tired, decreased energy 1  Change in appetite 1  Feeling bad or failure about yourself  0  Trouble concentrating 0  Moving slowly or fidgety/restless 0  Suicidal thoughts 0  PHQ-9 Score 3  Difficult doing work/chores Not difficult at all     Other providers/specialists: Patient Care Team: Inda Coke, [place] as PCP - General (Physician Assistant) Regina Eck, CNM as Referring Physician (Certified Nurse Midwife) Nunzio Cobbs, MD as Consulting Physician (Obstetrics and Gynecology)    PMHx, SurgHx, SocialHx, Medications, and Allergies were reviewed in the Visit Navigator and updated as appropriate.   Past Medical History:  Diagnosis Date   Abnormal Pap smear of cervix    1990s--hx of cryotherapy to cervix   Allergy 2009   All are related to the chronic urticaria   Anxiety    COVID-19 virus infection 10/2019   Depression    Genital warts    Thyroid disease 2013   Urticaria    chronic autoimmune urticaria     Past Surgical History:  Procedure Laterality Date   CESAREAN SECTION     CYST EXCISION     from chest, benign     Family History  Problem Relation Age of Onset   Atrial fibrillation Mother    Heart disease Mother    Hypertension Mother    Heart disease Father    Hypertension Father    Hyperlipidemia Father    Eczema Father    Diabetes Father    Crohn's disease Brother    Heart disease Maternal Grandmother    Diabetes Maternal Grandmother    Breast cancer Maternal Grandmother 75  Hypertension Maternal Grandmother    Stroke Maternal Grandmother    Arthritis Maternal Grandmother    Dementia Maternal Grandmother    Heart disease Maternal Grandfather    Lung cancer Maternal Grandfather    Colon cancer Maternal Grandfather 53   Brain cancer Maternal Grandfather    Hypertension Maternal Grandfather    Heart attack Maternal Grandfather    Prostate cancer Maternal Grandfather    Heart disease Paternal Grandmother    Hypertension Paternal Grandmother    Heart disease Paternal Grandfather    Hypertension Paternal Grandfather    Heart attack Paternal Grandfather    Other Daughter        pre-diabetes   Colon cancer Maternal Uncle 40   Diabetes Maternal Uncle    Stroke Maternal Uncle    Dementia Maternal Uncle    Allergic rhinitis Neg  Hx    Angioedema Neg Hx    Asthma Neg Hx    Immunodeficiency Neg Hx    Urticaria Neg Hx     Social History   Tobacco Use   Smoking status: Never   Smokeless tobacco: Never   Tobacco comments:    No use  Vaping Use   Vaping Use: Never used  Substance Use Topics   Alcohol use: Yes    Comment: 1-2 drinks a month   Drug use: No    Review of Systems:   Review of Systems  Constitutional:  Negative for chills, fever, malaise/fatigue and weight loss.  HENT:  Negative for hearing loss, sinus pain and sore throat.   Respiratory:  Negative for cough and hemoptysis.   Cardiovascular:  Negative for chest pain, palpitations, leg swelling and PND.  Gastrointestinal:  Negative for abdominal pain, constipation, diarrhea, heartburn, nausea and vomiting.  Genitourinary:  Negative for dysuria, frequency and urgency.  Musculoskeletal:  Negative for back pain, myalgias and neck pain.  Skin:  Negative for itching and rash.  Neurological:  Negative for dizziness, tingling, seizures and headaches.  Endo/Heme/Allergies:  Negative for polydipsia.  Psychiatric/Behavioral:  Negative for depression. The patient is not nervous/anxious.     Objective:   BP 118/70 (BP Location: Left Arm, Patient Position: Sitting, Cuff Size: Large)   Pulse 70   Temp (!) 97.3 F (36.3 C) (Temporal)   Ht '5\' 3"'$  (1.6 m)   Wt 207 lb (93.9 kg)   LMP 04/18/2022 (Exact Date)   SpO2 98%   BMI 36.67 kg/m  Body mass index is 36.67 kg/m.   General Appearance:    Alert, cooperative, no distress, appears stated age  Head:    Normocephalic, without obvious abnormality, atraumatic  Eyes:    PERRL, conjunctiva/corneas clear, EOM's intact, fundi    benign, both eyes  Ears:    Normal TM's and external ear canals, both ears  Nose:   Nares normal, septum midline, mucosa normal, no drainage    or sinus tenderness  Throat:   Lips, mucosa, and tongue normal; teeth and gums normal  Neck:   Supple, symmetrical, trachea midline, no  adenopathy;    thyroid:  no enlargement/tenderness/nodules; no carotid   bruit or JVD  Back:     Symmetric, no curvature, ROM normal, no CVA tenderness  Lungs:     Clear to auscultation bilaterally, respirations unlabored  Chest Wall:    No tenderness or deformity   Heart:    Regular rate and rhythm, S1 and S2 normal, no murmur, rub or gallop  Breast Exam:    Deferred  Abdomen:  Soft, non-tender, bowel sounds active all four quadrants,    no masses, no organomegaly  Genitalia:    Deferred  Extremities:   Extremities normal, atraumatic, no cyanosis or edema  Pulses:   2+ and symmetric all extremities  Skin:   Skin color, texture, turgor normal, no rashes or lesions  Lymph nodes:   Cervical, supraclavicular, and axillary nodes normal  Neurologic:   CNII-XII intact, normal strength, sensation and reflexes    throughout    Assessment/Plan:   Routine physical examination Today patient counseled on age appropriate routine health concerns for screening and prevention, each reviewed and up to date or declined. Immunizations reviewed and up to date or declined. Labs ordered and reviewed. Risk factors for depression reviewed and negative. Hearing function and visual acuity are intact. ADLs screened and addressed as needed. Functional ability and level of safety reviewed and appropriate. Education, counseling and referrals performed based on assessed risks today. Patient provided with a copy of personalized plan for preventive services.  Hypothyroidism, unspecified type Update TSH and adjust Synthroid 75 mcg daily Follow-up timeline to be based on results  Obesity, unspecified classification, unspecified obesity type, unspecified whether serious comorbidity present Continue efforts at healthy diet and exercise  Situational anxiety Well controlled Continue zoloft 25 mg daily and xanax prn Follow-up in 1 yr, sooner if concerns  Special screening for malignant neoplasms, colon Update  colonoscopy in Aug She would like to do through [facility] -- referral placed  Inda Coke, PA-C [place]

## 2022-05-15 ENCOUNTER — Telehealth: Payer: Self-pay | Admitting: Gastroenterology

## 2022-05-15 NOTE — Telephone Encounter (Signed)
Good morning Dr. Sheppard Penton  The following patient is requesting to transfer to our establishment. She was referred to [place] to have a colonoscopy done. She usually gets them done at [facility] but she is asking to come to [place] because they are overbooked. Records are in Epic.Please review and advise of scheduling. Thank you

## 2022-06-03 ENCOUNTER — Other Ambulatory Visit (HOSPITAL_COMMUNITY): Payer: Self-pay | Admitting: Cardiology

## 2022-06-03 DIAGNOSIS — Z8249 Family history of ischemic heart disease and other diseases of the circulatory system: Secondary | ICD-10-CM

## 2022-06-18 ENCOUNTER — Other Ambulatory Visit (HOSPITAL_BASED_OUTPATIENT_CLINIC_OR_DEPARTMENT_OTHER): Payer: 59

## 2022-06-18 ENCOUNTER — Ambulatory Visit (HOSPITAL_BASED_OUTPATIENT_CLINIC_OR_DEPARTMENT_OTHER)
Admission: RE | Admit: 2022-06-18 | Discharge: 2022-06-18 | Disposition: A | Discharge status: Home / Self Care | Payer: 59 | Source: Ambulatory Visit | Attending: Cardiology | Admitting: Cardiology

## 2022-06-18 DIAGNOSIS — Z8249 Family history of ischemic heart disease and other diseases of the circulatory system: Secondary | ICD-10-CM

## 2022-06-25 ENCOUNTER — Other Ambulatory Visit (HOSPITAL_COMMUNITY): Payer: Self-pay

## 2022-06-25 ENCOUNTER — Encounter (HOSPITAL_COMMUNITY): Payer: Self-pay

## 2022-08-08 ENCOUNTER — Other Ambulatory Visit (HOSPITAL_BASED_OUTPATIENT_CLINIC_OR_DEPARTMENT_OTHER): Payer: Self-pay | Admitting: Physician Assistant

## 2022-08-08 ENCOUNTER — Other Ambulatory Visit: Payer: Self-pay

## 2022-08-08 ENCOUNTER — Other Ambulatory Visit: Payer: Self-pay | Admitting: Physician Assistant

## 2022-08-08 DIAGNOSIS — Z1231 Encounter for screening mammogram for malignant neoplasm of breast: Secondary | ICD-10-CM

## 2022-08-08 DIAGNOSIS — E039 Hypothyroidism, unspecified: Secondary | ICD-10-CM

## 2022-08-08 MED ORDER — SYNTHROID 75 MCG PO TABS
75.0000 ug | ORAL_TABLET | Freq: Every day | ORAL | 0 refills | Status: DC
  Filled 2022-08-08: qty 90, 90d supply, fill #0

## 2022-09-04 ENCOUNTER — Other Ambulatory Visit (HOSPITAL_COMMUNITY): Payer: Self-pay

## 2022-09-04 ENCOUNTER — Other Ambulatory Visit (HOSPITAL_BASED_OUTPATIENT_CLINIC_OR_DEPARTMENT_OTHER): Payer: Self-pay

## 2022-09-04 ENCOUNTER — Encounter: Payer: Self-pay | Admitting: Physician Assistant

## 2022-09-04 ENCOUNTER — Ambulatory Visit: Payer: 59 | Admitting: Physician Assistant

## 2022-09-04 VITALS — BP 124/82 | HR 72 | Temp 97.7°F | Ht 63.0 in | Wt 209.2 lb

## 2022-09-04 DIAGNOSIS — L309 Dermatitis, unspecified: Secondary | ICD-10-CM

## 2022-09-04 MED ORDER — TRIAMCINOLONE ACETONIDE 0.5 % EX OINT
1.0000 | TOPICAL_OINTMENT | Freq: Two times a day (BID) | CUTANEOUS | 2 refills | Status: AC
  Filled 2022-09-04: qty 30, 15d supply, fill #0
  Filled 2022-12-26: qty 30, 15d supply, fill #1

## 2022-09-04 NOTE — Progress Notes (Signed)
Julie Yang is a 52 y.o. female here for a new problem.  History of Present Illness:   Chief Complaint  Patient presents with   Eczema    Pt is having an outbreak on right hand. She needs refill on medication.    HPI  Eczema Flare-up  Notes itchy rash on her right hand since changing face lotion. Requesting refill of eczema cream. She has triamcinolone ointment in the past with good relief.   Past Medical History:  Diagnosis Date   Abnormal Pap smear of cervix    1990s--hx of cryotherapy to cervix   Allergy 2009   All are related to the chronic urticaria   Anxiety    COVID-19 virus infection 10/2019   Depression    Genital warts    Thyroid disease 2013   Urticaria    chronic autoimmune urticaria     Social History   Tobacco Use   Smoking status: Never   Smokeless tobacco: Never   Tobacco comments:    No use  Vaping Use   Vaping Use: Never used  Substance Use Topics   Alcohol use: Yes    Comment: 1-2 drinks a month   Drug use: No    Past Surgical History:  Procedure Laterality Date   CESAREAN SECTION     CYST EXCISION     from chest, benign    Family History  Problem Relation Age of Onset   Atrial fibrillation Mother    Heart disease Mother    Hypertension Mother    Heart disease Father    Hypertension Father    Hyperlipidemia Father    Eczema Father    Diabetes Father    Crohn's disease Brother    Heart disease Maternal Grandmother    Diabetes Maternal Grandmother    Breast cancer Maternal Grandmother 32   Hypertension Maternal Grandmother    Stroke Maternal Grandmother    Arthritis Maternal Grandmother    Dementia Maternal Grandmother    Heart disease Maternal Grandfather    Lung cancer Maternal Grandfather    Colon cancer Maternal Grandfather 11   Brain cancer Maternal Grandfather    Hypertension Maternal Grandfather    Heart attack Maternal Grandfather    Prostate cancer Maternal Grandfather    Heart disease Paternal Grandmother     Hypertension Paternal Grandmother    Heart disease Paternal Grandfather    Hypertension Paternal Grandfather    Heart attack Paternal Grandfather    Other Daughter        pre-diabetes   Colon cancer Maternal Uncle 40   Diabetes Maternal Uncle    Stroke Maternal Uncle    Dementia Maternal Uncle    Allergic rhinitis Neg Hx    Angioedema Neg Hx    Asthma Neg Hx    Immunodeficiency Neg Hx    Urticaria Neg Hx     Allergies  Allergen Reactions   Cortisone Hives   Prednisone Hives   Aspirin Hives    Exacerbates hives   Ibuprofen Hives    Exacerbates hives   Iodine Hives   Povidone Iodine Rash    Current Medications:   Current Outpatient Medications:    ALPRAZolam (XANAX) 0.25 MG tablet, Take 1 tablet (0.25 mg total) by mouth at bedtime as needed for anxiety., Disp: 20 tablet, Rfl: 0   Cholecalciferol (VITAMIN D) 2000 units CAPS, Take 1 capsule by mouth daily., Disp: , Rfl:    Probiotic Product (PROBIOTIC ADVANCED PO), Take 1 capsule by mouth daily., Disp: ,  Rfl:    sertraline (ZOLOFT) 25 MG tablet, Take 1 tablet (25 mg total) by mouth daily., Disp: 90 tablet, Rfl: 3   SYNTHROID 75 MCG tablet, Take 1 tablet (75 mcg total) by mouth daily before breakfast., Disp: 90 tablet, Rfl: 0   Review of Systems:   Review of Systems  Constitutional:  Negative for fever and malaise/fatigue.  HENT:  Negative for congestion.   Eyes:  Negative for blurred vision.  Respiratory:  Negative for cough and shortness of breath.   Cardiovascular:  Negative for chest pain, palpitations and leg swelling.  Gastrointestinal:  Negative for vomiting.  Musculoskeletal:  Negative for back pain.  Skin:  Positive for itching and rash.  Neurological:  Negative for loss of consciousness and headaches.    Vitals:   Vitals:   09/04/22 0936  BP: 124/82  Pulse: 72  Temp: 97.7 F (36.5 C)  TempSrc: Temporal  SpO2: 99%  Weight: 209 lb 4 oz (94.9 kg)  Height: 5\' 3"  (1.6 m)     Body mass index is 37.07  kg/m.  Physical Exam:   Physical Exam Constitutional:      Appearance: Normal appearance. She is well-developed.  HENT:     Head: Normocephalic and atraumatic.  Eyes:     General: Lids are normal.     Extraocular Movements: Extraocular movements intact.     Conjunctiva/sclera: Conjunctivae normal.  Pulmonary:     Effort: Pulmonary effort is normal.  Musculoskeletal:        General: Normal range of motion.     Cervical back: Normal range of motion and neck supple.  Skin:    General: Skin is warm and dry.     Comments: Bilateral index fingers with erythematous plaques and excoriations  Neurological:     Mental Status: She is alert and oriented to person, place, and time.  Psychiatric:        Attention and Perception: Attention and perception normal.        Mood and Affect: Mood normal.        Behavior: Behavior normal.        Thought Content: Thought content normal.        Judgment: Judgment normal.     Assessment and Plan:   Eczema, unspecified type No red flags Will refill triamcinolone cream Follow-up if new/worsening sx  I,Alexander Ruley,acting as a scribe for [employer], PA.,have documented all relevant documentation on the behalf of Jarold Motto, PA,as directed by  Jarold Motto, PA while in the presence of Jarold Motto, [place].  I, Jarold Motto, [place], have reviewed all documentation for this visit. The documentation on 09/04/22 for the exam, diagnosis, procedures, and orders are all accurate and complete.  Jarold Motto, PA-C

## 2022-09-19 ENCOUNTER — Encounter: Payer: Self-pay | Admitting: Nurse Practitioner

## 2022-09-19 ENCOUNTER — Ambulatory Visit: Payer: 59 | Admitting: Nurse Practitioner

## 2022-09-19 ENCOUNTER — Other Ambulatory Visit (HOSPITAL_COMMUNITY): Payer: Self-pay

## 2022-09-19 VITALS — BP 144/80 | HR 70 | Temp 98.9°F | Ht 63.0 in | Wt 207.6 lb

## 2022-09-19 DIAGNOSIS — H6690 Otitis media, unspecified, unspecified ear: Secondary | ICD-10-CM

## 2022-09-19 DIAGNOSIS — J029 Acute pharyngitis, unspecified: Secondary | ICD-10-CM | POA: Diagnosis not present

## 2022-09-19 LAB — POCT RAPID STREP A (OFFICE): Rapid Strep A Screen: NEGATIVE

## 2022-09-19 MED ORDER — AMOXICILLIN-POT CLAVULANATE 875-125 MG PO TABS
1.0000 | ORAL_TABLET | Freq: Two times a day (BID) | ORAL | 0 refills | Status: DC
  Filled 2022-09-19: qty 14, 7d supply, fill #0

## 2022-09-19 NOTE — Patient Instructions (Signed)
It was great to see you!  Start augmentin twice a day for 7 days with food  You can take tylenol as needed for the pain, zyrtec or claritin can also help with symptoms  Let's follow-up if your symptoms worsen or don't improve.   Take care,  Rodman Pickle, NP

## 2022-09-19 NOTE — Progress Notes (Signed)
Acute Office Visit  Subjective:     Patient ID: Julie Yang, female    DOB: 23-Aug-1970, 52 y.o.   MRN: [id number]  Chief Complaint  Patient presents with   Ear Pain    With post nasal drip and sore throat since Wednesday morning-did home Covid test and was negative   HPI:  Patient is in today for sore throat and ear pain since yesterday.   UPPER RESPIRATORY TRACT INFECTION  Fever: no - however having chills Cough: no Shortness of breath: no Wheezing: no Chest pain: no Chest tightness: no Chest congestion: no Nasal congestion: no Runny nose: no Post nasal drip: yes Sneezing: no Sore throat: yes Swollen glands: no Sinus pressure: no Headache: yes Face pain: no Toothache: no Ear pain: yes bilateral Ear pressure: no bilateral Eyes red/itching:no Eye drainage/crusting: no  Vomiting: no Rash: no Fatigue: yes Sick contacts: no Strep contacts: no  Context: worse Recurrent sinusitis: no Relief with OTC cold/cough medications: no  Treatments attempted: dayquil  ROS See pertinent positives and negatives per HPI.     Objective:    BP (!) 144/80 (BP Location: Left Arm, Cuff Size: Normal)   Pulse 70   Temp 98.9 F (37.2 C) (Oral)   Ht 5\' 3"  (1.6 m)   Wt 207 lb 9.6 oz (94.2 kg)   LMP 08/29/2022 (Exact Date)   SpO2 98%   BMI 36.77 kg/m    Physical Exam Vitals and nursing note reviewed.  Constitutional:      General: She is not in acute distress.    Appearance: Normal appearance.  HENT:     Head: Normocephalic.     Right Ear: External ear normal. Tympanic membrane is erythematous.     Left Ear: External ear normal. Tympanic membrane is erythematous.     Mouth/Throat:     Pharynx: Posterior oropharyngeal erythema present. No oropharyngeal exudate.  Eyes:     Conjunctiva/sclera: Conjunctivae normal.  Cardiovascular:     Rate and Rhythm: Normal rate and regular rhythm.     Pulses: Normal pulses.     Heart sounds: Normal heart sounds.  Pulmonary:      Effort: Pulmonary effort is normal.     Breath sounds: Normal breath sounds.  Musculoskeletal:     Cervical back: Normal range of motion and neck supple. No tenderness.  Lymphadenopathy:     Cervical: No cervical adenopathy.  Skin:    General: Skin is warm.  Neurological:     General: No focal deficit present.     Mental Status: She is alert and oriented to person, place, and time.  Psychiatric:        Mood and Affect: Mood normal.        Behavior: Behavior normal.        Thought Content: Thought content normal.        Judgment: Judgment normal.       Assessment & Plan:   Problem List Items Addressed This Visit   None Visit Diagnoses     Acute otitis media, unspecified otitis media type    -  Primary   Start augmentin BID x7 days. Can take tylenol as needed for pain.   Relevant Medications   amoxicillin-clavulanate (AUGMENTIN) 875-125 MG tablet   Sore throat       POC strep negative. Can gargle with warm salt water, take tylenol as needed for pain. Encourage fluids, rest. F/U if not improving.   Relevant Orders   POCT rapid strep A (Completed)  Meds ordered this encounter  Medications   amoxicillin-clavulanate (AUGMENTIN) 875-125 MG tablet    Sig: Take 1 tablet by mouth 2 (two) times daily.    Dispense:  14 tablet    Refill:  0    She will pick this up, do not mail    Return if symptoms worsen or fail to improve.  Gerre Scull, NP

## 2022-09-24 ENCOUNTER — Other Ambulatory Visit: Payer: Self-pay

## 2022-09-26 ENCOUNTER — Encounter: Payer: Self-pay | Admitting: Cardiology

## 2022-09-26 ENCOUNTER — Other Ambulatory Visit: Payer: Self-pay

## 2022-09-26 ENCOUNTER — Ambulatory Visit (INDEPENDENT_AMBULATORY_CARE_PROVIDER_SITE_OTHER): Payer: 59 | Admitting: Cardiology

## 2022-09-26 ENCOUNTER — Ambulatory Visit
Admission: RE | Admit: 2022-09-26 | Discharge: 2022-09-26 | Disposition: A | Discharge status: Home / Self Care | Payer: 59 | Source: Ambulatory Visit | Attending: Physician Assistant | Admitting: Physician Assistant

## 2022-09-26 ENCOUNTER — Ambulatory Visit: Payer: 59

## 2022-09-26 VITALS — BP 124/82 | HR 70 | Ht 63.0 in | Wt 210.4 lb

## 2022-09-26 DIAGNOSIS — R5383 Other fatigue: Secondary | ICD-10-CM | POA: Diagnosis not present

## 2022-09-26 DIAGNOSIS — R9431 Abnormal electrocardiogram [ECG] [EKG]: Secondary | ICD-10-CM

## 2022-09-26 DIAGNOSIS — Z1231 Encounter for screening mammogram for malignant neoplasm of breast: Secondary | ICD-10-CM | POA: Diagnosis not present

## 2022-09-26 DIAGNOSIS — Z6837 Body mass index (BMI) 37.0-37.9, adult: Secondary | ICD-10-CM | POA: Diagnosis not present

## 2022-09-26 DIAGNOSIS — R0609 Other forms of dyspnea: Secondary | ICD-10-CM | POA: Insufficient documentation

## 2022-09-26 DIAGNOSIS — Z01812 Encounter for preprocedural laboratory examination: Secondary | ICD-10-CM | POA: Diagnosis not present

## 2022-09-26 DIAGNOSIS — R002 Palpitations: Secondary | ICD-10-CM

## 2022-09-26 MED ORDER — METOPROLOL TARTRATE 100 MG PO TABS
100.0000 mg | ORAL_TABLET | Freq: Once | ORAL | 0 refills | Status: DC
  Filled 2022-09-26: qty 1, 1d supply, fill #0

## 2022-09-26 NOTE — Patient Instructions (Addendum)
Medication Instructions:  Your physician recommends that you continue on your current medications as directed. Please refer to the Current Medication list given to you today.  *If you need a refill on your cardiac medications before your next appointment, please call your pharmacy*   Lab Work: Your physician recommends that you have labs drawn today: BMET, Mag, Vit D If you have labs (blood work) drawn today and your tests are completely normal, you will receive your results only by: MyChart Message (if you have MyChart) OR A paper copy in the mail If you have any lab test that is abnormal or we need to change your treatment, we will call you to review the results.   Testing/Procedures: Your physician has recommended that you have a sleep study. This test records several body functions during sleep, including: brain activity, eye movement, oxygen and carbon dioxide blood levels, heart rate and rhythm, breathing rate and rhythm, the flow of air through your mouth and nose, snoring, body muscle movements, and chest and belly movement.  ZIO AT Long term monitor-Live Telemetry  Your physician has requested you wear a ZIO patch monitor for 14 days.  This is a single patch monitor. [employer] supplies one patch monitor per enrollment. Additional  stickers are not available.  Please do not apply patch if you will be having a Nuclear Stress Test, Echocardiogram, Cardiac CT, MRI,  or Chest Xray during the period you would be wearing the monitor. The patch cannot be worn during  these tests. You cannot remove and re-apply the ZIO AT patch monitor.  Your ZIO patch monitor will be mailed 3 day USPS to your address on file. It may take 3-5 days to  receive your monitor after you have been enrolled.  Once you have received your monitor, please review the enclosed instructions. Your monitor has  already been registered assigning a specific monitor serial # to you.   Billing and Patient Assistance Program  information  Julie Yang has been supplied with any insurance information on record for billing. [employer] offers a sliding scale Patient Assistance Program for patients without insurance, or whose  insurance does not completely cover the cost of the ZIO patch monitor. You must apply for the  Patient Assistance Program to qualify for the discounted rate. To apply, call [employer] at [phone],  select option 4, select option 2 , ask to apply for the Patient Assistance Program, (you can request an  interpreter if needed). [employer] will ask your household income and how many people are in your  household. [employer] will quote your out-of-pocket cost based on this information. They will also be able  to set up a 12 month interest free payment plan if needed.  Applying the monitor   Shave hair from upper left chest.  Hold the abrader disc by orange tab. Rub the abrader in 40 strokes over left upper chest as indicated in  your monitor instructions.  Clean area with 4 enclosed alcohol pads. Use all pads to ensure the area is cleaned thoroughly. Let  dry.  Apply patch as indicated in monitor instructions. Patch will be placed under collarbone on left side of  chest with arrow pointing upward.  Rub patch adhesive wings for 2 minutes. Remove the white label marked "1". Remove the white label  marked "2". Rub patch adhesive wings for 2 additional minutes.  While looking in a mirror, press and release button in center of patch. A small green light will flash 3-4  times. This will  be your only indicator that the monitor has been turned on.  Do not shower for the first 24 hours. You may shower after the first 24 hours.  Press the button if you feel a symptom. You will hear a small click. Record Date, Time and Symptom in  the Patient Log.   Starting the Gateway  In your kit there is a Audiological scientist box the size of a cellphone. This is Buyer, retail. It transmits all your  recorded data to [facility]. This box  must always stay within 10 feet of you. Open the box and push the *  button. There will be a light that blinks orange and then green a few times. When the light stops  blinking, the Gateway is connected to the ZIO patch. Call [employer] at [phone] to confirm your monitor is transmitting.  Returning your monitor  Remove your patch and place it inside the Gateway. In the lower half of the Gateway there is a white  bag with prepaid postage on it. Place Gateway in bag and seal. Mail package back to [place] as soon as  possible. Your physician should have your final report approximately 7 days after you have mailed back  your monitor. Call [facility] Customer Care at [phone] if you have questions regarding your ZIO AT  patch monitor. Call them immediately if you see an orange light blinking on your monitor.  If your monitor falls off in less than 4 days, contact our Monitor department at [phone]. If your  monitor becomes loose or falls off after 4 days call [employer] at [phone] for suggestions on  securing your monitor    Your cardiac CT will be scheduled at one of the below locations:   [facility] [address] [place], [place] [address] [phone]    If scheduled at [facility], please arrive at the [facility] and Children's Entrance (Entrance C2) of [facility] 30 minutes prior to test start time. You can use the FREE valet parking offered at entrance C (encouraged to control the heart rate for the test)  Proceed to the [facility] Radiology Department (first floor) to check-in and test prep.  All radiology patients and guests should use entrance C2 at [facility], accessed from [facility], even though the hospital's physical address listed is [address].     Please follow these instructions carefully (unless otherwise directed):  Hold all erectile dysfunction medications at least 3 days  (72 hrs) prior to test. (Ie viagra, cialis, sildenafil, tadalafil, etc) We will administer nitroglycerin during this exam.   On the Night Before the Test: Be sure to Drink plenty of water. Do not consume any caffeinated/decaffeinated beverages or chocolate 12 hours prior to your test. Do not take any antihistamines 12 hours prior to your test.  On the Day of the Test: Drink plenty of water until 1 hour prior to the test. Do not eat any food 1 hour prior to test. You may take your regular medications prior to the test.  Take metoprolol (Lopressor) two hours prior to test. If you take Furosemide/Hydrochlorothiazide/Spironolactone, please HOLD on the morning of the test. FEMALES- please wear underwire-free bra if available, avoid dresses & tight clothing      After the Test: Drink plenty of water. After receiving IV contrast, you may experience a mild flushed feeling. This is normal. On occasion, you may experience a mild rash up to 24 hours after the test. This  is not dangerous. If this occurs, you can take Benadryl 25 mg and increase your fluid intake. If you experience trouble breathing, this can be serious. If it is severe call 911 IMMEDIATELY. If it is mild, please call our office. If you take any of these medications: Glipizide/Metformin, Avandament, Glucavance, please do not take 48 hours after completing test unless otherwise instructed.  We will call to schedule your test 2-4 weeks out understanding that some insurance companies will need an authorization prior to the service being performed.   For non-scheduling related questions, please contact the cardiac imaging nurse navigator should you have any questions/concerns: Rockwell Alexandria, Cardiac Imaging Nurse Navigator Larey Brick, Cardiac Imaging Nurse Navigator [facility] Direct Office Dial: [phone]   For scheduling needs, including cancellations and rescheduling, please call [place],  [phone].    Follow-Up: At [facility], you and your health needs are our priority.  As part of our continuing mission to provide you with exceptional heart care, we have created designated Provider Care Teams.  These Care Teams include your primary Cardiologist (physician) and Advanced Practice Providers (APPs -  Physician Assistants and Nurse Practitioners) who all work together to provide you with the care you need, when you need it.    Your next appointment:   1 year(s)  Provider:   Thomasene Ripple, DO

## 2022-09-26 NOTE — Progress Notes (Signed)
Cardiology Office Note:    Date:  09/27/2022   ID:  Julie Yang, DOB 1970-05-27, MRN [id number]  PCP:  Jarold Motto, PA  Cardiologist:  Thomasene Ripple, DO  Electrophysiologist:  None   Referring MD: Jarold Motto, PA   " I am having palpitations"  History of Present Illness:    Julie Yang is a 52 y.o. female with a hx of thyroid disease, chronic autoimmune urticaria, hypertensive disorder in pregnancy, obesity comes today to be evaluated for increasing palpitations.    She tells me that she has been having intermittent palpitations which she describes as a sensation that happen at least 3-4 times daily. It  has been occurring recently much more.  It usually feel as if she is having a heart fluttering sensation sometimes squeezing.  No lightheadedness no dizziness.  But she has had associated shortness of breath and some chest discomfort.  She notes that caffeine makes this worse and she stopped using artificial sweeteners because this also triggers her symptoms.  She has cut back on her cup of coffee.  She tells me mostly it happens around midday around 10 AM and by 7 PM it resolves.  She is concerned because she feels irregularity and note that there are significant atrial fibrillation history in her family.  Of note episodes has been happening more frequently recently.    Family history: Mother with atrial fibrillation and she is status post ablation, maternal grandmother with MI at the age of 61, CAD runs in her father side as well.  She wore the monitor in 2021 did not show any evidence of A-fib, she did also get echocardiogram which was normal with no structural abnormalities.     Past Medical History:  Diagnosis Date   Abnormal Pap smear of cervix    1990s--hx of cryotherapy to cervix   Allergy 2009   All are related to the chronic urticaria   Anxiety    COVID-19 virus infection 10/2019   Depression    Genital warts    Thyroid disease 2013   Urticaria     chronic autoimmune urticaria    Past Surgical History:  Procedure Laterality Date   CESAREAN SECTION     CYST EXCISION     from chest, benign    Current Medications: Current Meds  Medication Sig   ALPRAZolam (XANAX) 0.25 MG tablet Take 1 tablet (0.25 mg total) by mouth at bedtime as needed for anxiety.   Cholecalciferol (VITAMIN D) 2000 units CAPS Take 1 capsule by mouth daily.   metoprolol tartrate (LOPRESSOR) 100 MG tablet Take 1 tablet (100 mg total) by mouth once for 1 dose 2 hours prior to CT   Probiotic Product (PROBIOTIC ADVANCED PO) Take 1 capsule by mouth daily.   sertraline (ZOLOFT) 25 MG tablet Take 1 tablet (25 mg total) by mouth daily.   SYNTHROID 75 MCG tablet Take 1 tablet (75 mcg total) by mouth daily before breakfast.   triamcinolone ointment (KENALOG) 0.5 % Apply 1 Application topically to affected area 1-2 times daily.     Allergies:   Cortisone, Prednisone, Aspirin, Ibuprofen, Iodine, and Povidone iodine   Social History   Socioeconomic History   Marital status: Married    Spouse name: Not on file   Number of children: Not on file   Years of education: Not on file   Highest education level: Bachelor's degree (e.g., BA, AB, BS)  Occupational History   Not on file  Tobacco Use   Smoking status: Never  Smokeless tobacco: Never   Tobacco comments:    No use  Vaping Use   Vaping Use: Never used  Substance and Sexual Activity   Alcohol use: Yes    Comment: 1-2 drinks a month   Drug use: No   Sexual activity: Yes    Partners: Male    Birth control/protection: Other-see comments    Comment: Vasectomy  Other Topics Concern   Not on file  Social History Narrative   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   Married   59 year old daughter (2021)   Social Determinants of Health   Financial Resource Strain: Low Risk  (09/02/2022)   Overall Financial Resource Strain (CARDIA)    Difficulty of Paying Living Expenses: Not very hard  Food Insecurity:  No Food Insecurity (09/02/2022)   Hunger Vital Sign    Worried About Running Out of Food in the Last Year: Never true    Ran Out of Food in the Last Year: Never true  Transportation Needs: No Transportation Needs (09/02/2022)   PRAPARE - Administrator, Civil Service (Medical): No    Lack of Transportation (Non-Medical): No  Physical Activity: Sufficiently Active (09/02/2022)   Exercise Vital Sign    Days of Exercise per Week: 5 days    Minutes of Exercise per Session: 40 min  Stress: No Stress Concern Present (09/02/2022)   [employer] of Occupational Health - Occupational Stress Questionnaire    Feeling of Stress : Only a little  Social Connections: Socially Integrated (09/02/2022)   Social Connection and Isolation Panel [NHANES]    Frequency of Communication with Friends and Family: Twice a week    Frequency of Social Gatherings with Friends and Family: Twice a week    Attends Religious Services: More than 4 times per year    Active Member of [employer] or Organizations: Yes    Attends Engineer, structural: More than 4 times per year    Marital Status: Married     Family History: The patient's family history includes Arthritis in her maternal grandmother; Atrial fibrillation in her mother; Brain cancer in her maternal grandfather; Breast cancer (age of onset: 87) in her maternal grandmother; Colon cancer (age of onset: 47) in her maternal uncle; Colon cancer (age of onset: 56) in her maternal grandfather; Crohn's disease in her brother; Dementia in her maternal grandmother and maternal uncle; Diabetes in her father, maternal grandmother, and maternal uncle; Eczema in her father; Heart attack in her maternal grandfather and paternal grandfather; Heart disease in her father, maternal grandfather, maternal grandmother, mother, paternal grandfather, and paternal grandmother; Hyperlipidemia in her father; Hypertension in her father, maternal grandfather, maternal grandmother,  mother, paternal grandfather, and paternal grandmother; Lung cancer in her maternal grandfather; Other in her daughter; Prostate cancer in her maternal grandfather; Stroke in her maternal grandmother and maternal uncle. There is no history of Allergic rhinitis, Angioedema, Asthma, Immunodeficiency, or Urticaria.  ROS:   Review of Systems  Constitution: Negative for decreased appetite, fever and weight gain.  HENT: Negative for congestion, ear discharge, hoarse voice and sore throat.   Eyes: Negative for discharge, redness, vision loss in right eye and visual halos.  Cardiovascular: Reports palpitation, dyspnea , negative for chest pain, , leg swelling, orthopnea .  Respiratory: Negative for cough, hemoptysis, shortness of breath and snoring.   Endocrine: Negative for heat intolerance and polyphagia.  Hematologic/Lymphatic: Negative for bleeding problem. Does not bruise/bleed easily.  Skin: Negative for flushing, nail  changes, rash and suspicious lesions.  Musculoskeletal: Negative for arthritis, joint pain, muscle cramps, myalgias, neck pain and stiffness.  Gastrointestinal: Negative for abdominal pain, bowel incontinence, diarrhea and excessive appetite.  Genitourinary: Negative for decreased libido, genital sores and incomplete emptying.  Neurological: Negative for brief paralysis, focal weakness, headaches and loss of balance.  Psychiatric/Behavioral: Negative for altered mental status, depression and suicidal ideas.  Allergic/Immunologic: Negative for HIV exposure and persistent infections.    EKGs/Labs/Other Studies Reviewed:    The following studies were reviewed today:   EKG:  The ekg ordered today demonstrates sinus rhythm, heart rate 70 bpm with precordial leads progression suggesting old anterior wall infarction.  Recent Labs: 05/14/2022: ALT 12; Hemoglobin 14.2; Platelets 360.0; TSH 2.15 09/26/2022: BUN 11; Creatinine, Ser 0.63; Magnesium 2.0; Potassium 4.9; Sodium 138  Recent  Lipid Panel    Component Value Date/Time   CHOL 167 05/14/2022 0908   CHOL 133 12/09/2018 0929   TRIG 74.0 05/14/2022 0908   HDL 40.70 05/14/2022 0908   HDL 37 (L) 12/09/2018 0929   CHOLHDL 4 05/14/2022 0908   VLDL 14.8 05/14/2022 0908   LDLCALC 111 (H) 05/14/2022 0908   LDLCALC 85 12/09/2018 0929    Physical Exam:    VS:  BP 124/82   Pulse 70   Ht 5\' 3"  (1.6 m)   Wt 210 lb 6.4 oz (95.4 kg)   LMP 08/29/2022 (Exact Date)   SpO2 98%   BMI 37.27 kg/m     Wt Readings from Last 3 Encounters:  09/26/22 210 lb 6.4 oz (95.4 kg)  09/19/22 207 lb 9.6 oz (94.2 kg)  09/04/22 209 lb 4 oz (94.9 kg)     GEN: Well nourished, well developed in no acute distress HEENT: Normal NECK: No JVD; No carotid bruits LYMPHATICS: No lymphadenopathy CARDIAC: S1S2 noted,RRR, no murmurs, rubs, gallops RESPIRATORY:  Clear to auscultation without rales, wheezing or rhonchi  ABDOMEN: Soft, non-tender, non-distended, +bowel sounds, no guarding. EXTREMITIES: No edema, No cyanosis, no clubbing MUSCULOSKELETAL:  No deformity  SKIN: Warm and dry NEUROLOGIC:  Alert and oriented x 3, non-focal PSYCHIATRIC:  Normal affect, good insight  ASSESSMENT:    1. Abnormal electrocardiogram   2. Fatigue, unspecified type   3. Pre-procedure lab exam   4. DOE (dyspnea on exertion)   5. Palpitations   6. BMI 37.0-37.9, adult    PLAN:     Palpitations-will place a monitor on the patient to rule out any arrhythmia especially atrial fibrillation given her significant family history of atrial fibrillation.  Will like to hold off on starting medication until we can be able to understand more from the monitor.  She will wear this for 14 days/ZIO live.  Her STOP-BANG score is 3 which is intermediate risk for sleep apnea.  Will like to send the patient for sleep study.  Will get vitamin D level today she is currently on vitamin D supplements.  With the dyspnea on exertion and abnormal EKG like to pursue coronary CTA  to rule out any coronary artery disease.  The symptoms chest pain is concerning, this patient does have intermediate risk for coronary artery disease and at this time I would like to pursue an ischemic evaluation in this patient.  Shared decision a coronary CTA at this time is appropriate.  I have discussed with the patient about the testing.  The patient has no IV contrast allergy and is agreeable to proceed with this test.  I reviewed her lipid profile which was done  in January 2024 LDL 111, total cholesterol 167, triglycerides 74, HDL 40 continue to monitor.  Continue to encourage diet modification.  We discussed lifestyle modification and I will be referring the patient to our healthy weight and wellness program.   The patient is in agreement with the above plan. The patient left the office in stable condition.  The patient will follow up in   Medication Adjustments/Labs and Tests Ordered: Current medicines are reviewed at length with the patient today.  Concerns regarding medicines are outlined above.  Orders Placed This Encounter  Procedures   CT CORONARY MORPH W/CTA COR W/SCORE W/CA W/CM &/OR WO/CM   Basic Metabolic Panel (BMET)   Magnesium   VITAMIN D 25 Hydroxy (Vit-D Deficiency, Fractures)   Amb Ref to Medical Weight Management   LONG TERM MONITOR (3-14 DAYS)   EKG 12-Lead   Split night study   Meds ordered this encounter  Medications   metoprolol tartrate (LOPRESSOR) 100 MG tablet    Sig: Take 1 tablet (100 mg total) by mouth once for 1 dose 2 hours prior to CT    Dispense:  1 tablet    Refill:  0    Patient Instructions  Medication Instructions:  Your physician recommends that you continue on your current medications as directed. Please refer to the Current Medication list given to you today.  *If you need a refill on your cardiac medications before your next appointment, please call your pharmacy*   Lab Work: Your physician recommends that you have labs drawn  today: BMET, Mag, Vit D If you have labs (blood work) drawn today and your tests are completely normal, you will receive your results only by: MyChart Message (if you have MyChart) OR A paper copy in the mail If you have any lab test that is abnormal or we need to change your treatment, we will call you to review the results.   Testing/Procedures: Your physician has recommended that you have a sleep study. This test records several body functions during sleep, including: brain activity, eye movement, oxygen and carbon dioxide blood levels, heart rate and rhythm, breathing rate and rhythm, the flow of air through your mouth and nose, snoring, body muscle movements, and chest and belly movement.  ZIO AT Long term monitor-Live Telemetry  Your physician has requested you wear a ZIO patch monitor for 14 days.  This is a single patch monitor. [employer] supplies one patch monitor per enrollment. Additional  stickers are not available.  Please do not apply patch if you will be having a Nuclear Stress Test, Echocardiogram, Cardiac CT, MRI,  or Chest Xray during the period you would be wearing the monitor. The patch cannot be worn during  these tests. You cannot remove and re-apply the ZIO AT patch monitor.  Your ZIO patch monitor will be mailed 3 day USPS to your address on file. It may take 3-5 days to  receive your monitor after you have been enrolled.  Once you have received your monitor, please review the enclosed instructions. Your monitor has  already been registered assigning a specific monitor serial # to you.   Billing and Patient Assistance Program information  [name] has been supplied with any insurance information on record for billing. [employer] offers a sliding scale Patient Assistance Program for patients without insurance, or whose  insurance does not completely cover the cost of the ZIO patch monitor. You must apply for the  Patient Assistance Program to qualify for the discounted  rate. To  apply, call [employer] at [phone],  select option 4, select option 2 , ask to apply for the Patient Assistance Program, (you can request an  interpreter if needed). [employer] will ask your household income and how many people are in your  household. [employer] will quote your out-of-pocket cost based on this information. They will also be able  to set up a 12 month interest free payment plan if needed.  Applying the monitor   Shave hair from upper left chest.  Hold the abrader disc by orange tab. Rub the abrader in 40 strokes over left upper chest as indicated in  your monitor instructions.  Clean area with 4 enclosed alcohol pads. Use all pads to ensure the area is cleaned thoroughly. Let  dry.  Apply patch as indicated in monitor instructions. Patch will be placed under collarbone on left side of  chest with arrow pointing upward.  Rub patch adhesive wings for 2 minutes. Remove the white label marked "1". Remove the white label  marked "2". Rub patch adhesive wings for 2 additional minutes.  While looking in a mirror, press and release button in center of patch. A small green light will flash 3-4  times. This will be your only indicator that the monitor has been turned on.  Do not shower for the first 24 hours. You may shower after the first 24 hours.  Press the button if you feel a symptom. You will hear a small click. Record Date, Time and Symptom in  the Patient Log.   Starting the Gateway  In your kit there is a Audiological scientist box the size of a cellphone. This is Buyer, retail. It transmits all your  recorded data to [facility]. This box must always stay within 10 feet of you. Open the box and push the *  button. There will be a light that blinks orange and then green a few times. When the light stops  blinking, the Gateway is connected to the ZIO patch. Call [employer] at [phone] to confirm your monitor is transmitting.  Returning your monitor  Remove your patch and place  it inside the Gateway. In the lower half of the Gateway there is a white  bag with prepaid postage on it. Place Gateway in bag and seal. Mail package back to [place] as soon as  possible. Your physician should have your final report approximately 7 days after you have mailed back  your monitor. Call [facility] Customer Care at [phone] if you have questions regarding your ZIO AT  patch monitor. Call them immediately if you see an orange light blinking on your monitor.  If your monitor falls off in less than 4 days, contact our Monitor department at [phone]. If your  monitor becomes loose or falls off after 4 days call [employer] at [phone] for suggestions on  securing your monitor    Your cardiac CT will be scheduled at one of the below locations:   [facility] [address] [place], [place] [address] [phone]    If scheduled at [facility], please arrive at the [facility] and Children's Entrance (Entrance C2) of [facility] 30 minutes prior to test start time. You can use the FREE valet parking offered at entrance C (encouraged to control the heart rate for the test)  Proceed to the [facility] Radiology Department (first floor) to check-in and test prep.  All radiology patients and guests should use entrance C2 at [facility], accessed from [place]  [facility], even though the hospital's physical address listed is [address]     Please follow these instructions carefully (unless otherwise directed):  Hold all erectile dysfunction medications at least 3 days (72 hrs) prior to test. (Ie viagra, cialis, sildenafil, tadalafil, etc) We will administer nitroglycerin during this exam.   On the Night Before the Test: Be sure to Drink plenty of water. Do not consume any caffeinated/decaffeinated beverages or chocolate 12 hours prior to your test. Do not take any antihistamines 12 hours prior to your  test.  On the Day of the Test: Drink plenty of water until 1 hour prior to the test. Do not eat any food 1 hour prior to test. You may take your regular medications prior to the test.  Take metoprolol (Lopressor) two hours prior to test. If you take Furosemide/Hydrochlorothiazide/Spironolactone, please HOLD on the morning of the test. FEMALES- please wear underwire-free bra if available, avoid dresses & tight clothing      After the Test: Drink plenty of water. After receiving IV contrast, you may experience a mild flushed feeling. This is normal. On occasion, you may experience a mild rash up to 24 hours after the test. This is not dangerous. If this occurs, you can take Benadryl 25 mg and increase your fluid intake. If you experience trouble breathing, this can be serious. If it is severe call 911 IMMEDIATELY. If it is mild, please call our office. If you take any of these medications: Glipizide/Metformin, Avandament, Glucavance, please do not take 48 hours after completing test unless otherwise instructed.  We will call to schedule your test 2-4 weeks out understanding that some insurance companies will need an authorization prior to the service being performed.   For non-scheduling related questions, please contact the cardiac imaging nurse navigator should you have any questions/concerns: Rockwell Alexandria, Cardiac Imaging Nurse Navigator Larey Brick, Cardiac Imaging Nurse Navigator [facility] Direct Office Dial: [phone]   For scheduling needs, including cancellations and rescheduling, please call [place], [phone].    Follow-Up: At [facility], you and your health needs are our priority.  As part of our continuing mission to provide you with exceptional heart care, we have created designated Provider Care Teams.  These Care Teams include your primary Cardiologist (physician) and Advanced Practice Providers (APPs -  Physician Assistants  and Nurse Practitioners) who all work together to provide you with the care you need, when you need it.    Your next appointment:   1 year(s)  Provider:   Thomasene Ripple, DO    Adopting a Healthy Lifestyle.  Know what a healthy weight is for you (roughly BMI <25) and aim to maintain this   Aim for 7+ servings of fruits and vegetables daily   65-80+ fluid ounces of water or unsweet tea for healthy kidneys   Limit to max 1 drink of alcohol per day; avoid smoking/tobacco   Limit animal fats in diet for cholesterol and heart health - choose grass fed whenever available   Avoid highly processed foods, and foods high in saturated/trans fats   Aim for low stress - take time to unwind and care for your mental health   Aim for 150 min of moderate intensity exercise weekly for heart health, and weights twice weekly for bone health   Aim for 7-9 hours of sleep daily   When it comes to diets, agreement about the perfect plan isnt easy to find, even among the experts.  Experts at the [facility] of [employer] developed an idea known as the Healthy Eating Plate. Just imagine a plate divided into logical, healthy portions.   The emphasis is on diet quality:   Load up on vegetables and fruits - one-half of your plate: Aim for color and variety, and remember that potatoes dont count.   Go for whole grains - one-quarter of your plate: Whole wheat, barley, wheat berries, quinoa, oats, brown rice, and foods made with them. If you want pasta, go with whole wheat pasta.   Protein power - one-quarter of your plate: Fish, chicken, beans, and nuts are all healthy, versatile protein sources. Limit red meat.   The diet, however, does go beyond the plate, offering a few other suggestions.   Use healthy plant oils, such as olive, canola, soy, corn, sunflower and peanut. Check the labels, and avoid partially hydrogenated oil, which have unhealthy trans fats.   If youre thirsty, drink water.  Coffee and tea are good in moderation, but skip sugary drinks and limit milk and dairy products to one or two daily servings.   The type of carbohydrate in the diet is more important than the amount. Some sources of carbohydrates, such as vegetables, fruits, whole grains, and beans-are healthier than others.   Finally, stay active  Signed, Thomasene Ripple, DO  09/27/2022 11:02 PM    [facility]

## 2022-09-26 NOTE — Progress Notes (Unsigned)
Enrolled patient for a 14 day Zio XT  monitor to be mailed to patients home  °

## 2022-09-27 LAB — BASIC METABOLIC PANEL
BUN/Creatinine Ratio: 17 (ref 9–23)
BUN: 11 mg/dL (ref 6–24)
CO2: 22 mmol/L (ref 20–29)
Calcium: 9.2 mg/dL (ref 8.7–10.2)
Chloride: 104 mmol/L (ref 96–106)
Creatinine, Ser: 0.63 mg/dL (ref 0.57–1.00)
Glucose: 88 mg/dL (ref 70–99)
Potassium: 4.9 mmol/L (ref 3.5–5.2)
Sodium: 138 mmol/L (ref 134–144)
eGFR: 109 mL/min/{1.73_m2} (ref >59)

## 2022-09-27 LAB — MAGNESIUM: Magnesium: 2 mg/dL (ref 1.6–2.3)

## 2022-09-27 LAB — VITAMIN D 25 HYDROXY (VIT D DEFICIENCY, FRACTURES): Vit D, 25-Hydroxy: 43.5 ng/mL (ref 30.0–100.0)

## 2022-09-30 ENCOUNTER — Other Ambulatory Visit (HOSPITAL_COMMUNITY): Payer: Self-pay

## 2022-09-30 ENCOUNTER — Ambulatory Visit: Payer: 59 | Admitting: Physician Assistant

## 2022-09-30 ENCOUNTER — Encounter: Payer: Self-pay | Admitting: Physician Assistant

## 2022-09-30 ENCOUNTER — Other Ambulatory Visit: Payer: Self-pay | Admitting: Physician Assistant

## 2022-09-30 VITALS — BP 150/88 | HR 92 | Temp 98.0°F | Ht 63.0 in | Wt 211.4 lb

## 2022-09-30 DIAGNOSIS — R928 Other abnormal and inconclusive findings on diagnostic imaging of breast: Secondary | ICD-10-CM

## 2022-09-30 DIAGNOSIS — J029 Acute pharyngitis, unspecified: Secondary | ICD-10-CM

## 2022-09-30 LAB — POCT RAPID STREP A (OFFICE): Rapid Strep A Screen: POSITIVE — AB

## 2022-09-30 MED ORDER — AMOXICILLIN 500 MG PO CAPS
500.0000 mg | ORAL_CAPSULE | Freq: Two times a day (BID) | ORAL | 0 refills | Status: AC
  Filled 2022-09-30: qty 20, 10d supply, fill #0

## 2022-09-30 NOTE — Progress Notes (Signed)
Julie Yang is a 52 y.o. female here for a new problem.  History of Present Illness:   Chief Complaint  Patient presents with   Sore Throat    Pt was seen on 5/23 treated for sore throat and bilateral ear infections, still c/o sore throat and ears hurt, completed antibiotics.    HPI  Sore throat:  She was seen by Julie Pickle, NP on 5/23 for sore throat, bilateral ear pain.  It was found she had a bilateral ear infection and was started on Augmentin twice daily for 7 days.  She has finished her antibiotics, but her sore throat and ear pain have not resolved.  Her symptoms temporarily improved and has gotten worse since Saturday.  States her pain is worse in right ear.  She previously was taking Dayquil.   Past Medical History:  Diagnosis Date   Abnormal Pap smear of cervix    1990s--hx of cryotherapy to cervix   Allergy 2009   All are related to the chronic urticaria   Anxiety    COVID-19 virus infection 10/2019   Depression    Genital warts    Thyroid disease 2013   Urticaria    chronic autoimmune urticaria     Social History   Tobacco Use   Smoking status: Never   Smokeless tobacco: Never   Tobacco comments:    No use  Vaping Use   Vaping Use: Never used  Substance Use Topics   Alcohol use: Yes    Comment: 1-2 drinks a month   Drug use: No    Past Surgical History:  Procedure Laterality Date   CESAREAN SECTION     CYST EXCISION     from chest, benign    Family History  Problem Relation Age of Onset   Atrial fibrillation Mother    Heart disease Mother    Hypertension Mother    Heart disease Father    Hypertension Father    Hyperlipidemia Father    Eczema Father    Diabetes Father    Crohn's disease Brother    Heart disease Maternal Grandmother    Diabetes Maternal Grandmother    Breast cancer Maternal Grandmother 20   Hypertension Maternal Grandmother    Stroke Maternal Grandmother    Arthritis Maternal Grandmother    Dementia Maternal  Grandmother    Heart disease Maternal Grandfather    Lung cancer Maternal Grandfather    Colon cancer Maternal Grandfather 61   Brain cancer Maternal Grandfather    Hypertension Maternal Grandfather    Heart attack Maternal Grandfather    Prostate cancer Maternal Grandfather    Heart disease Paternal Grandmother    Hypertension Paternal Grandmother    Heart disease Paternal Grandfather    Hypertension Paternal Grandfather    Heart attack Paternal Grandfather    Other Daughter        pre-diabetes   Colon cancer Maternal Uncle 40   Diabetes Maternal Uncle    Stroke Maternal Uncle    Dementia Maternal Uncle    Allergic rhinitis Neg Hx    Angioedema Neg Hx    Asthma Neg Hx    Immunodeficiency Neg Hx    Urticaria Neg Hx     Allergies  Allergen Reactions   Cortisone Hives   Prednisone Hives   Aspirin Hives    Exacerbates hives   Ibuprofen Hives    Exacerbates hives   Iodine Hives   Povidone Iodine Rash    Current Medications:   Current Outpatient  Medications:    ALPRAZolam (XANAX) 0.25 MG tablet, Take 1 tablet (0.25 mg total) by mouth at bedtime as needed for anxiety., Disp: 20 tablet, Rfl: 0   Cholecalciferol (VITAMIN D) 2000 units CAPS, Take 1 capsule by mouth daily., Disp: , Rfl:    Probiotic Product (PROBIOTIC ADVANCED PO), Take 1 capsule by mouth daily., Disp: , Rfl:    sertraline (ZOLOFT) 25 MG tablet, Take 1 tablet (25 mg total) by mouth daily., Disp: 90 tablet, Rfl: 3   SYNTHROID 75 MCG tablet, Take 1 tablet (75 mcg total) by mouth daily before breakfast., Disp: 90 tablet, Rfl: 0   metoprolol tartrate (LOPRESSOR) 100 MG tablet, Take 1 tablet (100 mg total) by mouth once for 1 dose 2 hours prior to CT, Disp: 1 tablet, Rfl: 0   triamcinolone ointment (KENALOG) 0.5 %, Apply 1 Application topically to affected area 1-2 times daily. (Patient not taking: Reported on 09/30/2022), Disp: 30 g, Rfl: 2   Review of Systems:   Review of Systems  HENT:  Positive for ear pain  (bilateral ear infection-- see HPI) and sore throat.     Vitals:   Vitals:   09/30/22 1521  BP: (!) 150/88  Pulse: 92  Temp: 98 F (36.7 C)  TempSrc: Temporal  SpO2: 99%  Weight: 211 lb 6.1 oz (95.9 kg)  Height: 5\' 3"  (1.6 m)     Body mass index is 37.44 kg/m.  Physical Exam:   Physical Exam Vitals and nursing note reviewed.  Constitutional:      General: She is not in acute distress.    Appearance: She is well-developed. She is not ill-appearing or toxic-appearing.  HENT:     Head: Normocephalic and atraumatic.     Right Ear: Ear canal and external ear normal. A middle ear effusion is present. Tympanic membrane is not erythematous, retracted or bulging.     Left Ear: Tympanic membrane, ear canal and external ear normal. Tympanic membrane is not erythematous, retracted or bulging.     Nose: Nose normal.     Right Sinus: No maxillary sinus tenderness or frontal sinus tenderness.     Left Sinus: No maxillary sinus tenderness or frontal sinus tenderness.     Mouth/Throat:     Pharynx: Uvula midline. Posterior oropharyngeal erythema present.     Tonsils: No tonsillar exudate.  Eyes:     General: Lids are normal.     Conjunctiva/sclera: Conjunctivae normal.  Neck:     Trachea: Trachea normal.  Cardiovascular:     Rate and Rhythm: Normal rate and regular rhythm.     Heart sounds: Normal heart sounds, S1 normal and S2 normal.  Pulmonary:     Effort: Pulmonary effort is normal.     Breath sounds: Normal breath sounds. No decreased breath sounds, wheezing, rhonchi or rales.  Lymphadenopathy:     Cervical: No cervical adenopathy.  Skin:    General: Skin is warm and dry.  Neurological:     Mental Status: She is alert.  Psychiatric:        Speech: Speech normal.        Behavior: Behavior normal. Behavior is cooperative.    Results for orders placed or performed in visit on 09/30/22  POCT rapid strep A  Result Value Ref Range   Rapid Strep A Screen Positive (A)  Negative     Assessment and Plan:   Sore throat No red flags on exam.  Will initiate amoxicillin per orders. Discussed taking medications as prescribed.  Reviewed return precautions including worsening fever, SOB, worsening cough or other concerns. Push fluids and rest. I recommend that patient follow-up if symptoms worsen or persist despite treatment x 7-10 days, sooner if needed.   I,Rachel Rivera,acting as a Neurosurgeon for [employer], PA.,have documented all relevant documentation on the behalf of Jarold Motto, PA,as directed by  Jarold Motto, PA while in the presence of Jarold Motto, [place].  I, Jarold Motto, [place], have reviewed all documentation for this visit. The documentation on 09/30/22 for the exam, diagnosis, procedures, and orders are all accurate and complete.   Jarold Motto, PA-C

## 2022-10-01 ENCOUNTER — Other Ambulatory Visit: Payer: 59

## 2022-10-01 ENCOUNTER — Other Ambulatory Visit: Payer: Self-pay

## 2022-10-01 DIAGNOSIS — R002 Palpitations: Secondary | ICD-10-CM

## 2022-10-01 NOTE — Progress Notes (Signed)
Reordered Zio monitor. Due to incorrect version being ordered in the beginning.  Spoke with pt. She is aware to remove he current monitor and apply the new monitor being shipped to her.

## 2022-10-01 NOTE — Progress Notes (Unsigned)
Enrolled patient for a 14 day Zio AT monitor to be mailed to patients home.  

## 2022-10-01 NOTE — Addendum Note (Signed)
Addended by: Reynolds Bowl on: 10/01/2022 02:30 PM   Modules accepted: Orders

## 2022-10-02 ENCOUNTER — Other Ambulatory Visit: Payer: Self-pay

## 2022-10-02 ENCOUNTER — Telehealth: Payer: Self-pay

## 2022-10-02 ENCOUNTER — Ambulatory Visit: Payer: 59 | Attending: Cardiology

## 2022-10-02 DIAGNOSIS — R002 Palpitations: Secondary | ICD-10-CM

## 2022-10-02 NOTE — Telephone Encounter (Signed)
Dr. Servando Salina spoke with pt. Pt aware the new XT monitor has been mailed out. Pt also aware an AT version is on the way but she is to not apply that but send it back. Pt verbalized understanding and will apply the XT when she receives it.

## 2022-10-02 NOTE — Progress Notes (Signed)
Zio XT ordered due to pt insurance charging [phone] got Zio AT. Dr. Servando Salina would like the non-live version ordered to save the pt this cost.

## 2022-10-02 NOTE — Progress Notes (Unsigned)
Enrolled patient for a 14 day Zio XT  monitor to be mailed to patients home  °

## 2022-10-02 NOTE — Telephone Encounter (Addendum)
Late entry:  Spoke with pt on 10/01/22 she is aware the incorrect monitor was ordered for her (Zio XT). Dr. Servando Salina wanted Zio AT (live version). Pr aware to remove the current monitor (Zio XT) and apply the new monitor when it arrives. Pt verbalized understanding and thanked me for calling to clear up the matter.

## 2022-10-05 DIAGNOSIS — R002 Palpitations: Secondary | ICD-10-CM | POA: Diagnosis not present

## 2022-10-08 ENCOUNTER — Encounter: Payer: Self-pay | Admitting: Physician Assistant

## 2022-10-08 DIAGNOSIS — J029 Acute pharyngitis, unspecified: Secondary | ICD-10-CM

## 2022-10-09 ENCOUNTER — Other Ambulatory Visit: Payer: 59

## 2022-10-09 ENCOUNTER — Other Ambulatory Visit: Payer: Self-pay

## 2022-10-09 ENCOUNTER — Other Ambulatory Visit (HOSPITAL_COMMUNITY): Payer: Self-pay

## 2022-10-09 ENCOUNTER — Other Ambulatory Visit: Payer: Self-pay | Admitting: Physician Assistant

## 2022-10-09 MED ORDER — LIDOCAINE VISCOUS HCL 2 % MT SOLN
15.0000 mL | OROMUCOSAL | 0 refills | Status: DC | PRN
  Filled 2022-10-09: qty 100, 3d supply, fill #0

## 2022-10-10 ENCOUNTER — Ambulatory Visit: Payer: 59 | Admitting: Physician Assistant

## 2022-10-10 ENCOUNTER — Encounter: Payer: Self-pay | Admitting: Physician Assistant

## 2022-10-10 ENCOUNTER — Other Ambulatory Visit (HOSPITAL_COMMUNITY): Payer: Self-pay

## 2022-10-10 VITALS — BP 126/60 | HR 83 | Temp 97.8°F | Ht 63.0 in | Wt 210.2 lb

## 2022-10-10 DIAGNOSIS — J029 Acute pharyngitis, unspecified: Secondary | ICD-10-CM | POA: Diagnosis not present

## 2022-10-10 LAB — POCT RAPID STREP A (OFFICE): Rapid Strep A Screen: NEGATIVE

## 2022-10-10 MED ORDER — IPRATROPIUM BROMIDE 0.06 % NA SOLN
2.0000 | Freq: Two times a day (BID) | NASAL | 12 refills | Status: DC
  Filled 2022-10-10: qty 15, 38d supply, fill #0
  Filled 2022-10-10: qty 15, 21d supply, fill #0

## 2022-10-10 NOTE — Progress Notes (Signed)
Julie Yang is a 52 y.o. female here for a follow up of a pre-existing problem.  History of Present Illness:   Chief Complaint  Patient presents with   Sore Throat    Pt sttaed that she has had a sore throat since 09/18/2022      Sore Throat: Complains of a persistent sore throat and ear pain starting 5/22. Tested positive for Strep 10 days ago. Reports her throat pain has improved, rates her pain at a 3 today. Started omeprazole yesterday. We also ordered lidocaine solution but this has not arrived to her house yet. She states it feels as though something is stuck in her throat. She reports her husband has told her she started snoring since she got sick, not a usual for her.  Her postnasal drip has improved, still clearing up.  Has intolerances to Ibuprofen.     Past Medical History:  Diagnosis Date   Abnormal Pap smear of cervix    1990s--hx of cryotherapy to cervix   Allergy 2009   All are related to the chronic urticaria   Anxiety    COVID-19 virus infection 10/2019   Depression    Genital warts    Thyroid disease 2013   Urticaria    chronic autoimmune urticaria     Social History   Tobacco Use   Smoking status: Never   Smokeless tobacco: Never   Tobacco comments:    No use  Vaping Use   Vaping Use: Never used  Substance Use Topics   Alcohol use: Yes    Comment: 1-2 drinks a month   Drug use: No    Past Surgical History:  Procedure Laterality Date   CESAREAN SECTION     CYST EXCISION     from chest, benign    Family History  Problem Relation Age of Onset   Atrial fibrillation Mother    Heart disease Mother    Hypertension Mother    Heart disease Father    Hypertension Father    Hyperlipidemia Father    Eczema Father    Diabetes Father    Crohn's disease Brother    Heart disease Maternal Grandmother    Diabetes Maternal Grandmother    Breast cancer Maternal Grandmother 41   Hypertension Maternal Grandmother    Stroke Maternal  Grandmother    Arthritis Maternal Grandmother    Dementia Maternal Grandmother    Heart disease Maternal Grandfather    Lung cancer Maternal Grandfather    Colon cancer Maternal Grandfather 84   Brain cancer Maternal Grandfather    Hypertension Maternal Grandfather    Heart attack Maternal Grandfather    Prostate cancer Maternal Grandfather    Heart disease Paternal Grandmother    Hypertension Paternal Grandmother    Heart disease Paternal Grandfather    Hypertension Paternal Grandfather    Heart attack Paternal Grandfather    Other Daughter        pre-diabetes   Colon cancer Maternal Uncle 40   Diabetes Maternal Uncle    Stroke Maternal Uncle    Dementia Maternal Uncle    Allergic rhinitis Neg Hx    Angioedema Neg Hx    Asthma Neg Hx    Immunodeficiency Neg Hx    Urticaria Neg Hx     Allergies  Allergen Reactions   Cortisone Hives   Prednisone Hives   Aspirin Hives    Exacerbates hives   Ibuprofen Hives    Exacerbates hives   Iodine Hives   Povidone Iodine  Rash    Current Medications:   Current Outpatient Medications:    ALPRAZolam (XANAX) 0.25 MG tablet, Take 1 tablet (0.25 mg total) by mouth at bedtime as needed for anxiety., Disp: 20 tablet, Rfl: 0   Cholecalciferol (VITAMIN D) 2000 units CAPS, Take 1 capsule by mouth daily., Disp: , Rfl:    ipratropium (ATROVENT) 0.06 % nasal spray, Place 2 sprays into both nostrils in the morning and at bedtime., Disp: 15 mL, Rfl: 12   lidocaine (XYLOCAINE) 2 % solution, Use as directed 15 mLs in the mouth or throat as needed for mouth pain., Disp: 100 mL, Rfl: 0   Probiotic Product (PROBIOTIC ADVANCED PO), Take 1 capsule by mouth daily., Disp: , Rfl:    sertraline (ZOLOFT) 25 MG tablet, Take 1 tablet (25 mg total) by mouth daily., Disp: 90 tablet, Rfl: 3   SYNTHROID 75 MCG tablet, Take 1 tablet (75 mcg total) by mouth daily before breakfast., Disp: 90 tablet, Rfl: 0   triamcinolone ointment (KENALOG) 0.5 %, Apply 1 Application  topically to affected area 1-2 times daily., Disp: 30 g, Rfl: 2   amoxicillin (AMOXIL) 500 MG capsule, Take 1 capsule (500 mg total) by mouth 2 (two) times daily for 10 days., Disp: 20 capsule, Rfl: 0   metoprolol tartrate (LOPRESSOR) 100 MG tablet, Take 1 tablet (100 mg total) by mouth once for 1 dose 2 hours prior to CT, Disp: 1 tablet, Rfl: 0   Review of Systems:   Review of Systems  HENT:  Positive for ear pain (bilateral) and sore throat (see HPI).     Vitals:   Vitals:   10/10/22 0831  BP: 126/60  Pulse: 83  Temp: 97.8 F (36.6 C)  SpO2: 98%  Weight: 210 lb 3.2 oz (95.3 kg)  Height: 5\' 3"  (1.6 m)     Body mass index is 37.24 kg/m.  Physical Exam:   Physical Exam Vitals and nursing note reviewed.  Constitutional:      General: She is not in acute distress.    Appearance: She is well-developed. She is not ill-appearing or toxic-appearing.  HENT:     Head: Normocephalic and atraumatic.     Right Ear: Ear canal and external ear normal. A middle ear effusion is present. Tympanic membrane is erythematous. Tympanic membrane is not retracted or bulging.     Left Ear: Ear canal and external ear normal. A middle ear effusion is present. Tympanic membrane is erythematous. Tympanic membrane is not retracted or bulging.     Nose: Nose normal.     Right Sinus: No maxillary sinus tenderness or frontal sinus tenderness.     Left Sinus: No maxillary sinus tenderness or frontal sinus tenderness.     Mouth/Throat:     Pharynx: Uvula midline. Posterior oropharyngeal erythema present.  Eyes:     General: Lids are normal.     Conjunctiva/sclera: Conjunctivae normal.  Neck:     Trachea: Trachea normal.  Cardiovascular:     Rate and Rhythm: Normal rate and regular rhythm.     Heart sounds: Normal heart sounds, S1 normal and S2 normal.  Pulmonary:     Effort: Pulmonary effort is normal.     Breath sounds: Normal breath sounds. No decreased breath sounds, wheezing, rhonchi or rales.   Lymphadenopathy:     Cervical: No cervical adenopathy.  Skin:    General: Skin is warm and dry.  Neurological:     Mental Status: She is alert.  Psychiatric:  Speech: Speech normal.        Behavior: Behavior normal. Behavior is cooperative.    Results for orders placed or performed in visit on 10/10/22  POCT rapid strep A  Result Value Ref Range   Rapid Strep A Screen Negative Negative     Assessment and Plan:   Sore throat Strep test negative Recommend continued Prilosec 20 mg daily x 1 week Start lidocaine mouthwash when she receives this Atrovent nasal spray to help clear up nasal congestion, eustachian tube dysfunction and post nasal drip If persists, will refer to ENT   I,Rachel Rivera,acting as a scribe for [employer], PA.,have documented all relevant documentation on the behalf of Jarold Motto, PA,as directed by  Jarold Motto, PA while in the presence of Jarold Motto, [place].  I, Jarold Motto, [place], have reviewed all documentation for this visit. The documentation on 10/10/22 for the exam, diagnosis, procedures, and orders are all accurate and complete.   Jarold Motto, PA-C

## 2022-10-11 DIAGNOSIS — R002 Palpitations: Secondary | ICD-10-CM | POA: Diagnosis not present

## 2022-10-16 ENCOUNTER — Other Ambulatory Visit: Payer: Self-pay | Admitting: Physician Assistant

## 2022-10-16 DIAGNOSIS — J029 Acute pharyngitis, unspecified: Secondary | ICD-10-CM

## 2022-10-16 NOTE — Telephone Encounter (Signed)
Please see pt message and advise 

## 2022-10-17 ENCOUNTER — Other Ambulatory Visit (HOSPITAL_COMMUNITY): Payer: Self-pay

## 2022-10-17 ENCOUNTER — Emergency Department (HOSPITAL_BASED_OUTPATIENT_CLINIC_OR_DEPARTMENT_OTHER)
Admission: EM | Admit: 2022-10-17 | Discharge: 2022-10-17 | Disposition: A | Discharge status: Home / Self Care | Payer: 59 | Attending: Emergency Medicine | Admitting: Emergency Medicine

## 2022-10-17 ENCOUNTER — Other Ambulatory Visit (HOSPITAL_BASED_OUTPATIENT_CLINIC_OR_DEPARTMENT_OTHER): Payer: Self-pay

## 2022-10-17 ENCOUNTER — Other Ambulatory Visit: Payer: Self-pay

## 2022-10-17 ENCOUNTER — Emergency Department (HOSPITAL_BASED_OUTPATIENT_CLINIC_OR_DEPARTMENT_OTHER): Payer: 59

## 2022-10-17 ENCOUNTER — Encounter (HOSPITAL_BASED_OUTPATIENT_CLINIC_OR_DEPARTMENT_OTHER): Payer: Self-pay | Admitting: Emergency Medicine

## 2022-10-17 DIAGNOSIS — H9201 Otalgia, right ear: Secondary | ICD-10-CM | POA: Diagnosis not present

## 2022-10-17 DIAGNOSIS — J029 Acute pharyngitis, unspecified: Secondary | ICD-10-CM | POA: Diagnosis present

## 2022-10-17 DIAGNOSIS — J3501 Chronic tonsillitis: Secondary | ICD-10-CM | POA: Insufficient documentation

## 2022-10-17 DIAGNOSIS — D72829 Elevated white blood cell count, unspecified: Secondary | ICD-10-CM | POA: Diagnosis not present

## 2022-10-17 DIAGNOSIS — R590 Localized enlarged lymph nodes: Secondary | ICD-10-CM | POA: Diagnosis not present

## 2022-10-17 DIAGNOSIS — Z79899 Other long term (current) drug therapy: Secondary | ICD-10-CM | POA: Insufficient documentation

## 2022-10-17 DIAGNOSIS — J039 Acute tonsillitis, unspecified: Secondary | ICD-10-CM | POA: Diagnosis not present

## 2022-10-17 DIAGNOSIS — H93291 Other abnormal auditory perceptions, right ear: Secondary | ICD-10-CM | POA: Diagnosis not present

## 2022-10-17 DIAGNOSIS — J351 Hypertrophy of tonsils: Secondary | ICD-10-CM | POA: Diagnosis not present

## 2022-10-17 LAB — BASIC METABOLIC PANEL
Anion gap: 8 (ref 5–15)
BUN: 11 mg/dL (ref 6–20)
CO2: 23 mmol/L (ref 22–32)
Calcium: 9.7 mg/dL (ref 8.9–10.3)
Chloride: 106 mmol/L (ref 98–111)
Creatinine, Ser: 0.65 mg/dL (ref 0.44–1.00)
GFR, Estimated: >60 mL/min (ref >60)
Glucose, Bld: 92 mg/dL (ref 70–99)
Potassium: 3.9 mmol/L (ref 3.5–5.1)
Sodium: 137 mmol/L (ref 135–145)

## 2022-10-17 LAB — CBC
HCT: 42.3 % (ref 36.0–46.0)
Hemoglobin: 14.5 g/dL (ref 12.0–15.0)
MCH: 31.5 pg (ref 26.0–34.0)
MCHC: 34.3 g/dL (ref 30.0–36.0)
MCV: 92 fL (ref 80.0–100.0)
Platelets: 376 10*3/uL (ref 150–400)
RBC: 4.6 MIL/uL (ref 3.87–5.11)
RDW: 13.2 % (ref 11.5–15.5)
WBC: 12.5 10*3/uL — ABNORMAL HIGH (ref 4.0–10.5)
nRBC: 0 % (ref 0.0–0.2)

## 2022-10-17 LAB — PREGNANCY, URINE: Preg Test, Ur: NEGATIVE

## 2022-10-17 MED ORDER — DIPHENHYDRAMINE HCL 25 MG PO CAPS
25.0000 mg | ORAL_CAPSULE | Freq: Once | ORAL | Status: AC
  Administered 2022-10-17: 25 mg via ORAL
  Filled 2022-10-17: qty 1

## 2022-10-17 MED ORDER — DEXAMETHASONE 4 MG PO TABS
12.0000 mg | ORAL_TABLET | Freq: Once | ORAL | Status: AC
  Administered 2022-10-17: 12 mg via ORAL
  Filled 2022-10-17: qty 3

## 2022-10-17 MED ORDER — IOHEXOL 300 MG/ML  SOLN
100.0000 mL | Freq: Once | INTRAMUSCULAR | Status: AC | PRN
  Administered 2022-10-17: 75 mL via INTRAVENOUS

## 2022-10-17 MED ORDER — PREDNISONE 10 MG (21) PO TBPK
ORAL_TABLET | ORAL | 0 refills | Status: DC
  Filled 2022-10-17: qty 21, 6d supply, fill #0

## 2022-10-17 MED ORDER — CLINDAMYCIN HCL 300 MG PO CAPS
300.0000 mg | ORAL_CAPSULE | Freq: Four times a day (QID) | ORAL | 0 refills | Status: DC
  Filled 2022-10-17: qty 40, 10d supply, fill #0

## 2022-10-17 NOTE — ED Triage Notes (Signed)
Pt arrives pov, steady gait, c/o RT side neck and throat pain and RT ear pain. Endorses concern for abscess. Recent dx and tx for strep, 2 rounds abx. 650mg  tylenol at 0900

## 2022-10-17 NOTE — Discharge Instructions (Signed)
You were seen in the ER for ongoing sore throat.  Your blood work and CT imaging looked reassuring. There was evidence of tonsillitis and pharyngitis, which could be from a viral infection. I recommend following up with ENT to discuss possible treatment options.   Continue to monitor how you're doing and return to the ER for new or worsening symptoms.

## 2022-10-17 NOTE — ED Provider Notes (Signed)
[place] EMERGENCY DEPARTMENT AT [facility] Provider Note   CSN: [id number] Arrival date & time: 10/17/22  4098     History  Chief Complaint  Patient presents with   Sore Throat    Julie Yang is a 52 y.o. female with history of chronic autoimmune urticaria, thyroid disease, anxiety, depression who presents the emergency department with concern for ongoing sore throat.  Patient has been having right-sided neck, throat and ear pain for the past month.  She initially was diagnosed with strep throat, given antibiotics, then was also treated for an ear infection.  Has been referred to ENT.  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  Not having fever.   Sore Throat       Home Medications Prior to Admission medications   Medication Sig Start Date End Date Taking? Authorizing Provider  ALPRAZolam (XANAX) 0.25 MG tablet Take 1 tablet (0.25 mg total) by mouth at bedtime as needed for anxiety. 10/26/21   Jarold Motto, PA  Cholecalciferol (VITAMIN D) 2000 units CAPS Take 1 capsule by mouth daily.    [provider]  ipratropium (ATROVENT) 0.06 % nasal spray Place 2 sprays into both nostrils in the morning and at bedtime. 10/10/22   Jarold Motto, PA  lidocaine (XYLOCAINE) 2 % solution Use as directed 15 mLs in the mouth or throat as needed for mouth pain. 10/09/22   Jarold Motto, PA  metoprolol tartrate (LOPRESSOR) 100 MG tablet Take 1 tablet (100 mg total) by mouth once for 1 dose 2 hours prior to CT 09/26/22 09/27/22  Tobb, Kardie, DO  Probiotic Product (PROBIOTIC ADVANCED PO) Take 1 capsule by mouth daily.    [provider]  sertraline (ZOLOFT) 25 MG tablet Take 1 tablet (25 mg total) by mouth daily. 11/29/21   Jarold Motto, PA  SYNTHROID 75 MCG tablet Take 1 tablet (75 mcg total) by mouth daily before breakfast. 08/08/22   Jarold Motto,  PA  triamcinolone ointment (KENALOG) 0.5 % Apply 1 Application topically to affected area 1-2 times daily. 09/04/22   Jarold Motto, PA      Allergies    Cortisone, Prednisone, Aspirin, Ibuprofen, Iodine, and Povidone iodine    Review of Systems   Review of Systems  Constitutional:  Negative for fever.  HENT:  Positive for ear pain and sore throat. Negative for trouble swallowing.   All other systems reviewed and are negative.   Physical Exam Updated Vital Signs BP (!) 164/100   Pulse 79   Temp 98.7 F (37.1 C)   Resp 18   Ht 5\' 3"  (1.6 m)   Wt 95.7 kg   LMP 09/26/2022 (Exact Date)   SpO2 98%   BMI 37.38 kg/m  Physical Exam Vitals and nursing note reviewed.  Constitutional:      Appearance: Normal appearance.  HENT:     Head: Normocephalic and atraumatic.     Mouth/Throat:     Lips: Pink.     Mouth: Mucous membranes are moist.     Pharynx: Oropharynx is clear.     Tonsils: No tonsillar abscesses. 1+ on the right. 1+ on the left.  Eyes:     Conjunctiva/sclera: Conjunctivae normal.  Neck:     Meningeal: Brudzinski's sign and Kernig's sign absent.  Pulmonary:     Effort: Pulmonary effort is normal. No respiratory distress.  Musculoskeletal:     Cervical  back: Normal range of motion.  Lymphadenopathy:     Cervical: Cervical adenopathy present.     Right cervical: Superficial cervical adenopathy present.  Skin:    General: Skin is warm and dry.  Neurological:     Mental Status: She is alert.  Psychiatric:        Mood and Affect: Mood normal.        Behavior: Behavior normal.     ED Results / Procedures / Treatments   Labs (all labs ordered are listed, but only abnormal results are displayed) Labs Reviewed  CBC - Abnormal; Notable for the following components:      Result Value   WBC 12.5 (*)    All other components within normal limits  BASIC METABOLIC PANEL  PREGNANCY, URINE    EKG None  Radiology CT Soft Tissue Neck W Contrast  Result Date:  10/17/2022 CLINICAL DATA:  concern for soft tissue infection after strep EXAM: CT NECK WITH CONTRAST TECHNIQUE: Multidetector CT imaging of the neck was performed using the standard protocol following the bolus administration of intravenous contrast. RADIATION DOSE REDUCTION: This exam was performed according to the departmental dose-optimization program which includes automated exposure control, adjustment of the mA and/or kV according to patient size and/or use of iterative reconstruction technique. CONTRAST:  75mL OMNIPAQUE IOHEXOL 300 MG/ML  SOLN COMPARISON:  None Available. FINDINGS: Pharynx and larynx: The epiglottis is normal in appearance. Bilateral palatine and adenoid tonsils are normal in appearance. There is mild hyperenhancement of the lingual tonsils (series 2, image 59). No evidence of soft tissue stranding in the parapharyngeal spaces. There is mild mucosal contrast enhancement in the oropharynx, which can be seen in the setting of pharyngitis. Salivary glands: No inflammation, mass, or stone. Thyroid: Normal. Lymph nodes: There are multiple prominent symmetric cervical lymph nodes, which are favored to be reactive. Vascular: Negative. Limited intracranial: Negative. Visualized orbits: Negative. Mastoids and visualized paranasal sinuses: Trace mucosal thickening in the left sphenoid sinus. No mastoid or middle ear effusion. Orbits are unremarkable Skeleton: No acute or aggressive process. Upper chest: Negative. Other: No focal abnormality is noted in the region of the skin marker placed in the right submandibular region IMPRESSION: 1. Mild hyperenhancement of the lingual tonsils, which can be seen in the setting of tonsillitis. No evidence of abscess. 2. Mild mucosal contrast enhancement in the oropharynx, which can be seen in the setting of pharyngitis. 3. Multiple prominent symmetric cervical lymph nodes, which are favored to be reactive. 4. No focal abnormality is noted in the region of the skin  marker placed in the right submandibular region. Electronically Signed   By: Lorenza Cambridge M.D.   On: 10/17/2022 12:21    Procedures Procedures    Medications Ordered in ED Medications  iohexol (OMNIPAQUE) 300 MG/ML solution 100 mL (75 mLs Intravenous Contrast Given 10/17/22 1203)  dexamethasone (DECADRON) tablet 12 mg (12 mg Oral Given 10/17/22 1248)  diphenhydrAMINE (BENADRYL) capsule 25 mg (25 mg Oral Given 10/17/22 1248)    ED Course/ Medical Decision Making/ A&P                             Medical Decision Making Amount and/or Complexity of Data Reviewed Labs: ordered. Radiology: ordered.  This patient is a 52 y.o. female  who presents to the ED for concern of sore throat x 1 month.   Differential diagnoses prior to evaluation: The emergent differential diagnosis includes, but is not  limited to,  PTA, RPA, pharyngitis. This is not an exhaustive differential.   Past Medical History / Co-morbidities / Social History: chronic autoimmune urticaria, thyroid disease, anxiety, depression  Additional history: Chart reviewed. Pertinent results include: reviewed PCP note from 6/13. Patient was encouraged to continue PPI and referred to ENT. 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   Physical Exam: Physical exam performed. The pertinent findings include: Bilateral tonsillar swelling without abscess, some mild oropharyngeal edema. Right sided cervical lymphadenopathy. No meningeal signs.   Lab Tests/Imaging studies: I personally interpreted labs/imaging and the pertinent results include:  mild leukocytosis of 12.5, otherwise CBC and BMP normal. Pregnancy negative.. CT scan with evidence of tonsillitis and pharyngitis, without abscess, stones. Lymphadenopathy, favored to be reactive. I agree with the radiologist interpretation.  Medications: I ordered  medication including decadron. Patient reports she can take steroids but often gets hives with them, so given tablet of benadryl as well.  I have reviewed the patients home medicines and have made adjustments as needed.   Disposition: After consideration of the diagnostic results and the patients response to treatment, I feel that emergency department workup does not suggest an emergent condition requiring admission or immediate intervention beyond what has been performed at this time. The plan is: discharge to home with ENT follow up for ongoing/chronic tonsillitis. No abscess requiring drainage or emergent consult today. Tolerating her own secretions, no stridor or difficulty breathing. The patient is safe for discharge and has been instructed to return immediately for worsening symptoms, change in symptoms or any other concerns.  Final Clinical Impression(s) / ED Diagnoses Final diagnoses:  Tonsillitis, chronic    Rx / DC Orders ED Discharge Orders     None      Portions of this report may have been transcribed using voice recognition software. Every effort was made to ensure accuracy; however, inadvertent computerized transcription errors may be present.    Su Monks, PA-C 10/17/22 1256    Tanda Rockers A, DO 10/18/22 [phone]

## 2022-10-17 NOTE — Telephone Encounter (Signed)
Please see pt msg and update

## 2022-10-21 ENCOUNTER — Ambulatory Visit
Admission: RE | Admit: 2022-10-21 | Discharge: 2022-10-21 | Disposition: A | Discharge status: Home / Self Care | Payer: 59 | Source: Ambulatory Visit | Attending: Physician Assistant | Admitting: Physician Assistant

## 2022-10-21 DIAGNOSIS — R928 Other abnormal and inconclusive findings on diagnostic imaging of breast: Secondary | ICD-10-CM

## 2022-10-23 DIAGNOSIS — R002 Palpitations: Secondary | ICD-10-CM | POA: Diagnosis not present

## 2022-10-28 ENCOUNTER — Telehealth (HOSPITAL_COMMUNITY): Payer: Self-pay | Admitting: Emergency Medicine

## 2022-10-28 NOTE — Addendum Note (Signed)
Addended by: Brunetta Genera on: 10/28/2022 02:50 PM   Modules accepted: Orders

## 2022-10-28 NOTE — Progress Notes (Signed)
Office: [phone]  /  Fax: [phone]   TeleHealth Visit:  This visit was completed with telemedicine (audio/video) technology. [place]tem has verbally consented to this TeleHealth visit. The patient is located at home, the provider is located at home. The participants in this visit include the listed provider and patient. The visit was conducted today via MyChart video.  Initial Visit  Julie Yang was seen via virtual visit today to evaluate for treatment of obesity. She is interested in losing weight to improve overall health and reduce the risk of weight related complications. She presents today to review program treatment options, initial physical assessment, and evaluation.     Height: 5\' 3"  Weight: 211 lbs BMI: 37  She was referred by: {emreferby:28303}  When asked what else they would like to accomplish? She states: {EMHopetoaccomplish:28304}  Weight history: ***  When asked how has your weight affected you? She states: {EMWeightAffected:28305}  Some associated conditions: {EMSomeConditions:28306}  Contributing factors: {EMcontributingfactors:28307}  Weight promoting medications identified: {EMWeightpromotingrx:28308}  Current nutrition plan: {EMNutritionplan:28309::"None"}  Current level of physical activity: {EMcurrentPA:28310::"None"}  Current or previous pharmacotherapy: {EM previousRx:28311}  Response to medication: {EMResponsetomedication:28312}  Past medical history includes:   Past Medical History:  Diagnosis Date   Abnormal Pap smear of cervix    1990s--hx of cryotherapy to cervix   Allergy 2009   All are related to the chronic urticaria   Anxiety    COVID-19 virus infection 10/2019   Depression    Genital warts    Thyroid disease 2013   Urticaria    chronic autoimmune urticaria     Objective:     General:  Alert, oriented and cooperative. Patient is in no acute distress.  Respiratory: Normal respiratory effort, no problems with  respiration noted  Mental Status: Normal mood and affect. Normal behavior. Normal judgment and thought content.    Assessment and Plan:   1. Hypothyroidism Stable.  Does not report symptoms associated with uncontrolled hypothyroidism. Medication(s): Levothyroxine 75 mcg daily. Lab Results  Component Value Date   TSH 2.15 05/14/2022    Plan: Continue levothyroxine at current dose.  2. ***  3. Obesity Treatment / Action Plan:  Will complete provided nutritional and psychosocial assessment questionnaire before the next appointment. Will be scheduled for indirect calorimetry to determine resting energy expenditure in a fasting state.  This will allow [place] to create a reduced calorie, high-protein meal plan to promote loss of fat mass while preserving muscle mass. Was counseled on pharmacotherapy and role as an adjunct in weight management.   Obesity Education Performed Today:  We discussed obesity as a disease and the importance of a more detailed evaluation of all the factors contributing to the disease.  We discussed the importance of long term lifestyle changes which include nutrition, exercise and behavioral modifications as well as the importance of customizing this to her specific health and social needs.  We discussed the benefits of reaching a healthier weight to alleviate the symptoms of existing conditions and reduce the risks of the biomechanical, metabolic and psychological effects of obesity.  Julie Yang appears to be in the action stage of change and states they are ready to start intensive lifestyle modifications and behavioral modifications.  ______________________________________________________________________________  She will be contacted by [facility] to set up initial appointment and the first follow up appointment with a physician.   The following office policies were discussed. She voiced understanding: - Patient will be considered late  at 6 minutes past appointment time.   -  For the first office visit, patient needs to arrive 1 hour early, fasting except for water. Patient should arrive 15 minutes early for all other visits. -  Patient will bring completed new patient paperwork to first office visit.  If not, appointment will be rescheduled.  30 minutes was spent today on this visit including the above counseling, pre-visit chart review, and post-visit documentation.  Reviewed by clinician on day of visit: allergies, medications, problem list, medical history, surgical history, family history, social history, and previous encounter notes pertinent to obesity diagnosis.    Jesse Sans, FNP

## 2022-10-28 NOTE — Telephone Encounter (Signed)
Reaching out to patient to offer assistance regarding upcoming cardiac imaging study; pt verbalizes understanding of appt date/time, parking situation and where to check in, pre-test NPO status and medications ordered, and verified current allergies; name and call back number provided for further questions should they arise Taylor Levick RN Navigator Cardiac Imaging [facility] [phone] office [phone] cell 

## 2022-10-29 ENCOUNTER — Ambulatory Visit (HOSPITAL_COMMUNITY)
Admission: RE | Admit: 2022-10-29 | Discharge: 2022-10-29 | Disposition: A | Discharge status: Home / Self Care | Payer: 59 | Source: Ambulatory Visit | Attending: Cardiology | Admitting: Cardiology

## 2022-10-29 ENCOUNTER — Telehealth (INDEPENDENT_AMBULATORY_CARE_PROVIDER_SITE_OTHER): Payer: 59 | Admitting: Family Medicine

## 2022-10-29 ENCOUNTER — Encounter (INDEPENDENT_AMBULATORY_CARE_PROVIDER_SITE_OTHER): Payer: Self-pay | Admitting: Family Medicine

## 2022-10-29 DIAGNOSIS — E669 Obesity, unspecified: Secondary | ICD-10-CM

## 2022-10-29 DIAGNOSIS — E039 Hypothyroidism, unspecified: Secondary | ICD-10-CM | POA: Diagnosis not present

## 2022-10-29 DIAGNOSIS — Z0289 Encounter for other administrative examinations: Secondary | ICD-10-CM

## 2022-10-29 DIAGNOSIS — Z6837 Body mass index (BMI) 37.0-37.9, adult: Secondary | ICD-10-CM | POA: Diagnosis not present

## 2022-10-29 DIAGNOSIS — R9431 Abnormal electrocardiogram [ECG] [EKG]: Secondary | ICD-10-CM | POA: Diagnosis not present

## 2022-10-29 MED ORDER — IOHEXOL 350 MG/ML SOLN
95.0000 mL | Freq: Once | INTRAVENOUS | Status: AC | PRN
  Administered 2022-10-29: 95 mL via INTRAVENOUS

## 2022-10-29 MED ORDER — NITROGLYCERIN 0.4 MG SL SUBL
SUBLINGUAL_TABLET | SUBLINGUAL | Status: AC
  Filled 2022-10-29: qty 2

## 2022-10-29 MED ORDER — NITROGLYCERIN 0.4 MG SL SUBL
0.8000 mg | SUBLINGUAL_TABLET | Freq: Once | SUBLINGUAL | Status: AC
  Administered 2022-10-29: 0.8 mg via SUBLINGUAL

## 2022-11-01 ENCOUNTER — Other Ambulatory Visit: Payer: Self-pay

## 2022-11-01 DIAGNOSIS — H9201 Otalgia, right ear: Secondary | ICD-10-CM | POA: Diagnosis not present

## 2022-11-01 DIAGNOSIS — J3501 Chronic tonsillitis: Secondary | ICD-10-CM | POA: Diagnosis not present

## 2022-11-01 MED ORDER — METOPROLOL TARTRATE 25 MG PO TABS
12.5000 mg | ORAL_TABLET | Freq: Three times a day (TID) | ORAL | 3 refills | Status: DC | PRN
  Filled 2022-11-01: qty 45, 30d supply, fill #0
  Filled 2022-12-26: qty 45, 30d supply, fill #1
  Filled 2023-03-12: qty 45, 30d supply, fill #2
  Filled 2023-04-18: qty 45, 30d supply, fill #3

## 2022-11-02 ENCOUNTER — Other Ambulatory Visit (HOSPITAL_COMMUNITY): Payer: Self-pay

## 2022-11-04 ENCOUNTER — Other Ambulatory Visit: Payer: Self-pay | Admitting: Physician Assistant

## 2022-11-04 ENCOUNTER — Other Ambulatory Visit (HOSPITAL_COMMUNITY): Payer: Self-pay

## 2022-11-04 DIAGNOSIS — E039 Hypothyroidism, unspecified: Secondary | ICD-10-CM

## 2022-11-05 ENCOUNTER — Other Ambulatory Visit: Payer: Self-pay

## 2022-11-05 MED ORDER — SYNTHROID 75 MCG PO TABS
75.0000 ug | ORAL_TABLET | Freq: Every day | ORAL | 0 refills | Status: DC
Start: 2022-11-05 — End: 2023-01-31
  Filled 2022-11-05: qty 90, 90d supply, fill #0

## 2022-11-05 NOTE — Telephone Encounter (Signed)
Okay for referral to ENT?

## 2022-11-06 ENCOUNTER — Other Ambulatory Visit: Payer: Self-pay | Admitting: Oncology

## 2022-11-06 DIAGNOSIS — Z006 Encounter for examination for normal comparison and control in clinical research program: Secondary | ICD-10-CM

## 2022-11-26 ENCOUNTER — Encounter (INDEPENDENT_AMBULATORY_CARE_PROVIDER_SITE_OTHER): Payer: Self-pay | Admitting: Family Medicine

## 2022-11-26 ENCOUNTER — Ambulatory Visit (INDEPENDENT_AMBULATORY_CARE_PROVIDER_SITE_OTHER): Payer: 59 | Admitting: Family Medicine

## 2022-11-26 VITALS — BP 128/85 | HR 72 | Temp 98.5°F | Ht 63.0 in | Wt 208.0 lb

## 2022-11-26 DIAGNOSIS — Z6837 Body mass index (BMI) 37.0-37.9, adult: Secondary | ICD-10-CM

## 2022-11-26 DIAGNOSIS — E669 Obesity, unspecified: Secondary | ICD-10-CM | POA: Diagnosis not present

## 2022-11-26 DIAGNOSIS — R0602 Shortness of breath: Secondary | ICD-10-CM | POA: Diagnosis not present

## 2022-11-26 DIAGNOSIS — F39 Unspecified mood [affective] disorder: Secondary | ICD-10-CM

## 2022-11-26 DIAGNOSIS — R5383 Other fatigue: Secondary | ICD-10-CM

## 2022-11-26 DIAGNOSIS — Z1331 Encounter for screening for depression: Secondary | ICD-10-CM | POA: Diagnosis not present

## 2022-11-26 DIAGNOSIS — E559 Vitamin D deficiency, unspecified: Secondary | ICD-10-CM | POA: Diagnosis not present

## 2022-11-26 DIAGNOSIS — E039 Hypothyroidism, unspecified: Secondary | ICD-10-CM | POA: Diagnosis not present

## 2022-11-26 DIAGNOSIS — I471 Supraventricular tachycardia, unspecified: Secondary | ICD-10-CM | POA: Diagnosis not present

## 2022-11-26 DIAGNOSIS — Z6836 Body mass index (BMI) 36.0-36.9, adult: Secondary | ICD-10-CM | POA: Diagnosis not present

## 2022-11-26 NOTE — Progress Notes (Signed)
[name], D.O.  ABFM, ABOM Specializing in Clinical Bariatric Medicine Office located at: [address] [place], [place]  [address]     Bariatric Medicine Visit  Dear Jarold Motto, [place]   Thank you for referring Syan Clack to our clinic today for evaluation.  We performed a consultation to discuss her options for treatment and educate the patient on her disease state.  The following note includes my evaluation and treatment recommendations.   Please do not hesitate to reach out to me directly if you have any further concerns.   Assessment and Plan:   Orders Placed This Encounter  Procedures   CBC with Differential/Platelet   VITAMIN D 25 Hydroxy (Vit-D Deficiency, Fractures)   Comprehensive metabolic panel   Folate   Hemoglobin A1c   Insulin, random   Lipid Panel With LDL/HDL Ratio   T4, free   TSH   Vitamin B12    Medications Discontinued During This Encounter  Medication Reason   ipratropium (ATROVENT) 0.06 % nasal spray    Cholecalciferol (VITAMIN D) 2000 units CAPS    Probiotic Product (PROBIOTIC ADVANCED PO)      No orders of the defined types were placed in this encounter.     Fatigue Assessment & Plan:  Nico does feel that her weight is causing her energy to be lower than it should be. Fatigue may be related to obesity, depression or many other causes. she does not appear to have any red flag symptoms and this appears to most likely be related to her current lifestyle habits and dietary intake.  Labs will be ordered and reviewed with her at their next office visit in two weeks. Epworth sleepiness scale score appears to be within normal limits.  Her ESS score is 2 . Makiah denies daytime somnolence and admits to waking up still tired. Joshlynn generally gets 7 hours of sleep per night, and states that she has generally restful sleep. Snoring is sometimes present. Apneic episodes is not present.  ECG from May 30th, 2024: Reviewed/ interpreted  independently.  Normal sinus rhythm, rate 70 bpm; reassuring without any acute abnormalities, will continue to monitor for symptoms.  Depression screen/Modified PHQ-9 Depression Screen: Her Food and Mood (modified PHQ-9) score was 13. In the meanwhile, Madden will focus on self care including making healthy food choices by following their meal plan, improving sleep quality and focusing on stress reduction.  Once we are assured she is on an appropriate meal plan, we will start discussing exercise to increase cardiovascular fitness levels.    Shortness of breath on exertion Assessment & Plan:  Victoire does feel that she gets out of breath more easily than she used to when she exercises and seems to be worsening over time with weight gain.  This has gotten worse recently. Somara denies shortness of breath at rest or orthopnea. Sharnelle's shortness of breath appears to be obesity related and exercise induced, as they do not appear to have any "red flag" symptoms/ concerns today.  Also, this condition appears to be related to a state of poor cardiovascular conditioning   Obtain labs today and will be reviewed with her at their next office visit in two weeks. Indirect Calorimeter completed today to help guide our dietary regimen. It shows a VO2 of 251 and a REE of 1728.  Her calculated basal metabolic rate is 6045 thus her measured basal metabolic rate is better than expected. Patient agreed to work on weight loss at this time.  As Embrie progresses through our weight loss program, we will gradually increase exercise as tolerated to treat her current condition.   If Cathrina follows our recommendations and loses 5-10% of their weight without improvement of her shortness of breath or if at any time, symptoms become more concerning, they agree to urgently follow up with their PCP/ specialist for further consideration/ evaluation.  Selenne verbalizes agreement with this plan.   Mood disorder (HCC) - Emotional Eating Assessment &  Plan:  Denies any SI/HI. Mood is stable. Endorses being placed on Zoloft 25 mg daily 1-2 years ago for panic attack. Continues to take medication and feels that her anxiety is well controlled. Also, takes Xanax prn - endorses not taking it very often. Her Food and Mood (modified PHQ-9) score was 13. Upon evaluation, pt endorses she does not struggle with depression. Has seen [facility] therapist in the past, currently not seeing one. Tends to eat when stressed, bored, as a reward, when angry, and to help comfort herself.    Continue with both mood medications per PCP. Informed patient about Dr.Barker, our Bariatric Psychologist, who can help [place]e with her struggles with emotional eating. Pt declines a referral today, but may consider meeting with her in the future. Reminded patient of the importance of following their prudent nutrition plan and how food can affect mood as well to support emotional wellbeing. We will begin to monitor closely alongside PCP / other specialists.    PSVT (paroxysmal supraventricular tachycardia) Assessment & Plan: Condition being treated with Lopressor 1/2 tablet (12.5 mg total) every 8 hours PRN. Pt feels that condition is well controlled. Pt notices her heart rate increases when having aspartame or too much caffeine.   Check labs today. Continue with Lopressor per Dr.Tobb. Continue follow up with cardiologist as needed.    Hypothyroidism, unspecified type Assessment & Plan: Pt endorses being diagnosed with Hypothyroidism in 2015. She is compliant and tolerant with Synthroid 95 mcg - denies any adverse effects.   Lab Results  Component Value Date   TSH 2.15 05/14/2022    Check labs today. Continue with Synthroid as prescribed by PCP We will begin to monitor alongside PCP as it relates to her weight loss journey.    Vitamin D deficiency Assessment & Plan:  Pt endorses having Vitamin D deficiency and takes OTC Vitamin 5,000 daily and also a daily Probiotic.    Lab Results  Component Value Date   VD25OH 43.5 09/26/2022   VD25OH 49.98 11/05/2019   VD25OH 51.5 12/09/2018   Check labs today. Continue with current supplementation regiment. Weight loss will likely improve availability of vitamin D, thus encouraged Marybelle to begin with meal plan and their weight loss efforts to further improve this condition.     TREATMENT PLAN FOR OBESITY: BMI 37.0-37.9, adult - current BMI 36.85 Generalized obesity Assessment & Plan:  Muscle mass is 115.8 lbs. Fat mass  is 86.4 lbs. Total body water is 82.2 lbs.   Aunesti is currently in the action stage of change. As such, her goal is to continue weight management plan.Antavia will work on healthier eating habits and try their best to follow the Category 2 meal plan with b/l options and 6-8 ounces of lean protein at dinner best they can.   Behavioral Intervention Additional resources provided today: category 2 meal plan information, breakfast options, and lunch options Evidence-based interventions for health behavior change were utilized today including the discussion of self monitoring techniques, problem-solving barriers and SMART goal setting techniques.  Regarding patient's less desirable eating habits and patterns, we employed the technique of small changes.  Pt will specifically work on: beginning prescribed meal plan for next visit.    Recommended Physical Activity Goals Zandrea has been advised to gradually work up to 150 minutes of moderate intensity aerobic activity a week and strengthening exercises 2-3 times per week for cardiovascular health, weight loss maintenance and preservation of muscle mass. She has agreed to continue their current level of activity  FOLLOW UP: Follow up in 2 weeks. She was informed of the importance of frequent follow up visits to maximize her success with intensive lifestyle modifications for her multiple health conditions.  Lauren Eaddy is aware that we will review all of her  lab results at our next visit.  She is aware that if anything is critical/ life threatening with the results, we will be contacting her via MyChart prior to the office visit to discuss management.    Chief Complaint:   OBESITY Carlissa Butterly (MR# [id number]) is a 52 y.o. female who presents for evaluation and treatment of obesity and related comorbidities. Current BMI is Body mass index is 36.85 kg/m. Warnetta has been struggling with her weight for many years and has been unsuccessful in either losing weight, maintaining weight loss, or reaching her healthy weight goal.  Raynna Holveck is currently in the action stage of change and ready to dedicate time achieving and maintaining a healthier weight. Jamicka is interested in becoming our patient and working on intensive lifestyle modifications including (but not limited to) diet and exercise for weight loss.  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 Patient is married to Rhett  and has a 72 y.o daughter . She lives with her husband and daughter.  Jaclyn Morozov's habits were reviewed today and are as follows:   - Desires to be 160 lbs in 1.5 years to improve health.   - Reasons for weight gain: stress and time constraints.   - Does walking/light weights 30-45 minutes, 5 days a wk.   - In the past, has tried Weight Watchers (lost 20 lbs) and No Carb/High Protein/Low calorie(worked best for her, lost 55 lbs)   - Eats outside the home 3 days a wk, eats fast food/take out 1 day a wk.   - Craves the following foods, especially in the evenings after work: savory, pasta, crackers, and pizza.   - Snacks on: crackers and candy.   - Dislikes: yogurt, oatmeal, egg plant, green pepper, and red curry.   - Caloric beverages she drinks: Coffee with half/half and sweeteners and Prosecco (3x/month)  - Worst Food habit: excess carbs and large portions.   Subjective:   This is the patient's first visit at [facility].  The patient's NEW PATIENT PACKET that they filled out prior to today's office visit was reviewed at length and information from that paperwork was included within the following office visit note.    Included in the packet: current and past health history, medications, allergies, ROS, gynecologic history (women only), surgical history, family history, social history, weight history, weight loss surgery history (for those that have had weight loss surgery), nutritional evaluation, mood and food questionnaire along with a depression screening (PHQ9) on all patients, an Epworth questionnaire, sleep habits questionnaire, patient life and health improvement goals questionnaire. These will all be scanned into the patient's chart under the "media" tab.   Review of Systems: Please refer to new  patient packet scanned into media. Pertinent positives were addressed with patient today.  Reviewed by clinician on day of visit: allergies, medications, problem list, medical history, surgical history, family history, social history, and previous encounter notes.  During the visit, I independently reviewed the patient's EKG, bioimpedance scale results, and indirect calorimeter results. I used this information to tailor a meal plan for the patient that will help Caretta Twedt to lose weight and will improve her obesity-related conditions going forward.  I performed a medically necessary appropriate examination and/or evaluation. I discussed the assessment and treatment plan with the patient. The patient was provided an opportunity to ask questions and all were answered. The patient agreed with the plan and demonstrated an understanding of the instructions. Labs were ordered today (unless patient declined them) and will be reviewed with the patient at our next visit unless more critical results need to be addressed immediately. Clinical information was updated and documented in the EMR.   Objective:    PHYSICAL EXAM: Blood pressure 128/85, pulse 72, temperature 98.5 F (36.9 C), height 5\' 3"  (1.6 m), weight 208 lb (94.3 kg), SpO2 99%. Body mass index is 36.85 kg/m. General: Well Developed, well nourished, and in no acute distress.  HEENT: Normocephalic, atraumatic Skin: Warm and dry, cap RF less 2 sec, good turgor Chest:  Normal excursion, shape, no gross abn Respiratory: speaking in full sentences, no conversational dyspnea NeuroM-Sk: Ambulates w/o assistance, moves * 4 Psych: A and O *3, insight good, mood-full  Anthropometric Measurements Height: 5\' 3"  (1.6 m) Weight: 208 lb (94.3 kg) BMI (Calculated): 36.85 Weight at Last Visit: n/a Weight Lost Since Last Visit: n/a Weight Gained Since Last Visit: n/a Starting Weight: 208lb Total Weight Loss (lbs): 0 lb (0 kg) Peak Weight: 215lb Waist Measurement : 41 inches   Body Composition  Body Fat %: 41.5 % Fat Mass (lbs): 86.4 lbs Muscle Mass (lbs): 115.8 lbs Total Body Water (lbs): 82.2 lbs Visceral Fat Rating : 11   Other Clinical Data RMR: [id number] Fasting: yes Labs: yes Today's Visit #: 1 Starting Date: 11/26/22 Comments: first visit    DIAGNOSTIC DATA REVIEWED:  BMET    Component Value Date/Time   NA 137 10/17/2022 1024   NA 138 09/26/2022 0847   K 3.9 10/17/2022 1024   CL 106 10/17/2022 1024   CO2 23 10/17/2022 1024   GLUCOSE 92 10/17/2022 1024   BUN 11 10/17/2022 1024   BUN 11 09/26/2022 0847   CREATININE 0.65 10/17/2022 1024   CREATININE 0.76 10/24/2017 1551   CALCIUM 9.7 10/17/2022 1024   GFRNONAA >60 10/17/2022 1024   GFRNONAA 95 10/24/2017 1551   GFRAA 123 12/09/2018 0929   GFRAA 111 10/24/2017 1551   Lab Results  Component Value Date   HGBA1C 5.1 05/11/2021   No results found for: "INSULIN" Lab Results  Component Value Date   TSH 2.15 05/14/2022   CBC    Component Value Date/Time   WBC 12.5 (H) 10/17/2022 1024   RBC 4.60 10/17/2022 1024   HGB 14.5 10/17/2022 1024   HGB 14.0  12/09/2018 0929   HCT 42.3 10/17/2022 1024   HCT 43.7 12/09/2018 0929   PLT 376 10/17/2022 1024   PLT 325 12/09/2018 0929   MCV 92.0 10/17/2022 1024   MCV 97 12/09/2018 0929   MCH 31.5 10/17/2022 1024   MCHC 34.3 10/17/2022 1024   RDW 13.2 10/17/2022 1024   RDW 12.5 12/09/2018 0929   Iron Studies No results found for: "IRON", "TIBC", "  FERRITIN", "IRONPCTSAT" Lipid Panel     Component Value Date/Time   CHOL 167 05/14/2022 0908   CHOL 133 12/09/2018 0929   TRIG 74.0 05/14/2022 0908   HDL 40.70 05/14/2022 0908   HDL 37 (L) 12/09/2018 0929   CHOLHDL 4 05/14/2022 0908   VLDL 14.8 05/14/2022 0908   LDLCALC 111 (H) 05/14/2022 0908   LDLCALC 85 12/09/2018 0929   Hepatic Function Panel     Component Value Date/Time   PROT 7.2 05/14/2022 0908   PROT 6.8 12/09/2018 0929   ALBUMIN 4.3 05/14/2022 0908   ALBUMIN 4.2 12/09/2018 0929   AST 13 05/14/2022 0908   ALT 12 05/14/2022 0908   ALKPHOS 56 05/14/2022 0908   BILITOT 0.5 05/14/2022 0908   BILITOT 0.3 12/09/2018 0929   BILIDIR 0.1 10/24/2017 1551   IBILI 0.2 10/24/2017 1551      Component Value Date/Time   TSH 2.15 05/14/2022 0908   Nutritional Lab Results  Component Value Date   VD25OH 43.5 09/26/2022   VD25OH 49.98 11/05/2019   VD25OH 51.5 12/09/2018    Attestation Statements:   I, Special Puri , acting as a Stage manager for Thomasene Lot, DO., have compiled all relevant documentation for today's office visit on behalf of Thomasene Lot, DO, while in the presence of [employer], DO.  Time spent on visit including pre-visit chart review and post-visit care was estimated to be 60 minutes. Over 50% of the time was spent in direct face to face counseling and coordination of care.  I have reviewed the above documentation for accuracy and completeness, and I agree with the above. [name], D.O.  The 21st Century Cures Act was signed into law in 2016 which includes the topic of electronic health records.   This provides immediate access to information in MyChart.  This includes consultation notes, operative notes, office notes, lab results and pathology reports.  If you have any questions about what you read please let us know at your next visit so we can discuss your concerns and take corrective action if need be.  We are right here with you.

## 2022-11-28 ENCOUNTER — Other Ambulatory Visit: Payer: Self-pay

## 2022-11-28 ENCOUNTER — Ambulatory Visit (INDEPENDENT_AMBULATORY_CARE_PROVIDER_SITE_OTHER): Payer: 59 | Admitting: Otolaryngology

## 2022-11-28 ENCOUNTER — Other Ambulatory Visit (HOSPITAL_COMMUNITY): Payer: Self-pay

## 2022-11-28 ENCOUNTER — Encounter (INDEPENDENT_AMBULATORY_CARE_PROVIDER_SITE_OTHER): Payer: Self-pay | Admitting: Otolaryngology

## 2022-11-28 VITALS — BP 139/83 | HR 79 | Ht 63.0 in | Wt 205.0 lb

## 2022-11-28 DIAGNOSIS — J029 Acute pharyngitis, unspecified: Secondary | ICD-10-CM

## 2022-11-28 DIAGNOSIS — J343 Hypertrophy of nasal turbinates: Secondary | ICD-10-CM | POA: Diagnosis not present

## 2022-11-28 DIAGNOSIS — J3089 Other allergic rhinitis: Secondary | ICD-10-CM

## 2022-11-28 DIAGNOSIS — J351 Hypertrophy of tonsils: Secondary | ICD-10-CM

## 2022-11-28 DIAGNOSIS — L500 Allergic urticaria: Secondary | ICD-10-CM | POA: Diagnosis not present

## 2022-11-28 DIAGNOSIS — R0982 Postnasal drip: Secondary | ICD-10-CM | POA: Diagnosis not present

## 2022-11-28 DIAGNOSIS — J3501 Chronic tonsillitis: Secondary | ICD-10-CM | POA: Diagnosis not present

## 2022-11-28 DIAGNOSIS — K219 Gastro-esophageal reflux disease without esophagitis: Secondary | ICD-10-CM

## 2022-11-28 MED ORDER — FAMOTIDINE 20 MG PO TABS
20.0000 mg | ORAL_TABLET | Freq: Two times a day (BID) | ORAL | 1 refills | Status: DC
  Filled 2022-11-28: qty 30, 15d supply, fill #0
  Filled 2022-12-26: qty 30, 15d supply, fill #1

## 2022-11-28 MED ORDER — FLUTICASONE PROPIONATE 50 MCG/ACT NA SUSP
2.0000 | Freq: Every day | NASAL | 6 refills | Status: DC
  Filled 2022-11-28: qty 16, 30d supply, fill #0

## 2022-11-28 MED ORDER — DESLORATADINE 5 MG PO TABS
5.0000 mg | ORAL_TABLET | Freq: Every day | ORAL | 3 refills | Status: DC
  Filled 2022-11-28: qty 90, 90d supply, fill #0
  Filled 2022-12-26: qty 90, 90d supply, fill #1

## 2022-11-28 NOTE — Progress Notes (Signed)
ENT CONSULT:  Reason for Consult: persistent sore throat and pain with swallowing    HPI: Julie Yang is an 52 y.o. female with hx angioedema/hives and allergic rhinitis, hx of chronic urticaria, hypothyroidism on synthroid, here for evaluation of persistent sore throat and discomfort with swallowing.  She reports several visits with PCP for the same sx, and she got a course of Augmentin in May and Amox in June (abnormal but not positive Strep swab in June). She reports she had a lot of pain with swallowing, and had a hard time talking 2/2 pain. CT neck showed tonsillitis in ED 10/17/22, she received prednisone and Clindamycin. Her throat pain got better and then in a few weeks she started to have recurrent/persistent sore throat and discomfort. She does not think it is after she eats. Sx wean on and off now, and she denies hx of food allergies in the past.  She reports stress induced hives (started after she gave birth to her daughter), sx of whole body hives, but no dyspnea or lip/oral swelling, and takes Sulfasalazine (it helps) - in remission right now without any recent episodes.   She had bad reflux while on Prednisone. Never had tonsillectomy. Had cautery of the nasal vessel for nose bleed at the age 52. She has seasonal allergies, at the turn of the season, end of May and end of November. Self-treats with Claritin or Allegra. It helps.   Records Reviewed:  ED provider note from [facility] 10/17/2022  Julie Yang is a 52 y.o. female with history of chronic autoimmune urticaria, thyroid disease, anxiety, depression who presents the emergency department with concern for ongoing sore throat.  Patient has been having right-sided neck, throat and ear pain for the past month.  She initially was diagnosed with strep throat, given antibiotics, then was also treated for an ear infection.  Has been referred to ENT.  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  Not having fever.   A/P The plan is: discharge to home with ENT follow up for ongoing/chronic tonsillitis. No abscess requiring drainage or emergent consult today. Tolerating her own secretions, no stridor or difficulty breathing. The patient is safe for discharge and has been instructed to return immediately for worsening symptoms, change in symptoms or any other concerns.    Past Medical History:  Diagnosis Date   Abnormal Pap smear of cervix    1990s--hx of cryotherapy to cervix   Allergy 2009   All are related to the chronic urticaria   Anxiety    COVID-19 virus infection 10/2019   Depression    Genital warts    GERD (gastroesophageal reflux disease)    Hypertension    Hypothyroidism    Joint pain    Thyroid disease 2013   Urticaria    chronic autoimmune urticaria   Vitamin D deficiency     Past Surgical History:  Procedure Laterality Date   CESAREAN SECTION     CYST EXCISION     from chest, benign    Family History  Problem Relation Age of Onset   Atrial fibrillation Mother    Heart disease Mother    Hypertension Mother    Hyperlipidemia Mother    Anxiety disorder Mother    Obesity Mother    Heart disease Father    Hypertension Father    Hyperlipidemia Father    Eczema Father  Diabetes Father    Sleep apnea Father    Crohn's disease Brother    Other Daughter        pre-diabetes   Colon cancer Maternal Uncle 40   Diabetes Maternal Uncle    Stroke Maternal Uncle    Dementia Maternal Uncle    Heart disease Maternal Grandmother    Diabetes Maternal Grandmother    Breast cancer Maternal Grandmother 56   Hypertension Maternal Grandmother    Stroke Maternal Grandmother    Arthritis Maternal Grandmother    Dementia Maternal Grandmother    Heart disease Maternal Grandfather    Lung cancer Maternal Grandfather    Colon cancer Maternal Grandfather 28   Brain cancer Maternal Grandfather     Hypertension Maternal Grandfather    Heart attack Maternal Grandfather    Prostate cancer Maternal Grandfather    Heart disease Paternal Grandmother    Hypertension Paternal Grandmother    Heart disease Paternal Grandfather    Hypertension Paternal Grandfather    Heart attack Paternal Grandfather    Allergic rhinitis Neg Hx    Angioedema Neg Hx    Asthma Neg Hx    Immunodeficiency Neg Hx    Urticaria Neg Hx     Social History:  reports that she has never smoked. She has never used smokeless tobacco. She reports current alcohol use. She reports that she does not use drugs.  Allergies:  Allergies  Allergen Reactions   Cortisone Hives   Prednisone Hives   Aspirin Hives    Exacerbates hives   Ibuprofen Hives    Exacerbates hives   Iodine Hives   Povidone Iodine Rash    Medications: I have reviewed the patient's current medications.  The PMH, PSH, Medications, Allergies, and SH were reviewed and updated.  ROS: Constitutional: Negative for fever, weight loss and weight gain. Cardiovascular: Negative for chest pain and dyspnea on exertion. Respiratory: Is not experiencing shortness of breath at rest. Gastrointestinal: Negative for nausea and vomiting. Neurological: Negative for headaches. Psychiatric: The patient is not nervous/anxious  Blood pressure 139/83, pulse 79, height 5\' 3"  (1.6 m), weight 205 lb (93 kg), SpO2 97%.  PHYSICAL EXAM:  Exam: General: Well-developed, well-nourished Communication and Voice: Clear pitch and clarity Respiratory Respiratory effort: Equal inspiration and expiration without stridor Cardiovascular Peripheral Vascular: Warm extremities with equal color/perfusion Eyes: No nystagmus with equal extraocular motion bilaterally Neuro/Psych/Balance: Patient oriented to person, place, and time; Appropriate mood and affect; Gait is intact with no imbalance; Cranial nerves I-XII are intact Head and Face Inspection: Normocephalic and atraumatic  without mass or lesion Palpation: Facial skeleton intact without bony stepoffs Salivary Glands: No mass or tenderness Facial Strength: Facial motility symmetric and full bilaterally ENT Pinna: External ear intact and fully developed External canal: Canal is patent with intact skin Tympanic Membrane: Clear and mobile External Nose: No scar or anatomic deformity Internal Nose: Septum is straight. No polyp, or purulence. Mucosal edema and erythema present.  Bilateral inferior turbinate hypertrophy.  Lips, Teeth, and gums: Mucosa and teeth intact and viable TMJ: No pain to palpation with full mobility Oral cavity/oropharynx: No erythema or exudate, no lesions present. 1+ tonsils.  Nasopharynx: No mass or lesion with intact mucosa Hypopharynx: Intact mucosa without pooling of secretions Larynx Glottic: Full true vocal cord mobility without lesion or mass Supraglottic: Normal appearing epiglottis and AE folds Interarytenoid Space: No or minimal pachydermia or edema Subglottic Space: Patent without lesion or edema Neck Neck and Trachea: Midline trachea without mass or lesion  Thyroid: No mass or nodularity Lymphatics: No lymphadenopathy  Procedure: Preoperative diagnosis:  Postoperative diagnosis:   Same  Procedure: Flexible fiberoptic laryngoscopy  Surgeon: Ashok Croon, MD  Anesthesia: Topical lidocaine and Afrin Complications: None Condition is stable throughout exam  Indications and consent:  The patient presents to the clinic with Indirect laryngoscopy view was incomplete. Thus it was recommended that they undergo a flexible fiberoptic laryngoscopy. All of the risks, benefits, and potential complications were reviewed with the patient preoperatively and verbal informed consent was obtained.  Procedure: The patient was seated upright in the clinic. Topical lidocaine and Afrin were applied to the nasal cavity. After adequate anesthesia had occurred, I then proceeded to pass  the flexible telescope into the nasal cavity. The nasal cavity was patent without rhinorrhea or polyp. The nasopharynx was also patent without mass or lesion. The base of tongue was visualized and was normal. There were no signs of pooling of secretions in the piriform sinuses. The true vocal folds were mobile bilaterally. There were no signs of glottic or supraglottic mucosal lesion or mass. There was moderate interarytenoid pachydermia and post cricoid edema. Lingual tonsils were prominent. The telescope was then slowly withdrawn and the patient tolerated the procedure throughout.      Studies Reviewed: CT neck with contrast done in ED 10/17/22   FINDINGS: Pharynx and larynx: The epiglottis is normal in appearance. Bilateral palatine and adenoid tonsils are normal in appearance. There is mild hyperenhancement of the lingual tonsils (series 2, image 59). No evidence of soft tissue stranding in the parapharyngeal spaces. There is mild mucosal contrast enhancement in the oropharynx, which can be seen in the setting of pharyngitis.   Salivary glands: No inflammation, mass, or stone.   Thyroid: Normal.   Lymph nodes: There are multiple prominent symmetric cervical lymph nodes, which are favored to be reactive.   Vascular: Negative.   Limited intracranial: Negative.   Visualized orbits: Negative.   Mastoids and visualized paranasal sinuses: Trace mucosal thickening in the left sphenoid sinus. No mastoid or middle ear effusion. Orbits are unremarkable   Skeleton: No acute or aggressive process.   Upper chest: Negative.   Other: No focal abnormality is noted in the region of the skin marker placed in the right submandibular region   IMPRESSION: 1. Mild hyperenhancement of the lingual tonsils, which can be seen in the setting of tonsillitis. No evidence of abscess. 2. Mild mucosal contrast enhancement in the oropharynx, which can be seen in the setting of pharyngitis. 3.  Multiple prominent symmetric cervical lymph nodes, which are favored to be reactive. 4. No focal abnormality is noted in the region of the skin marker placed in the right submandibular region.      Assessment/Plan: Encounter Diagnoses  Name Primary?   Pharyngitis, unspecified etiology    Chronic tonsillitis Yes   Chronic urticaria    Sore throat    Gastroesophageal reflux disease without esophagitis    Tonsillar hypertrophy    Environmental and seasonal allergies    Hypertrophy of inferior nasal turbinate    Post-nasal drainage    52 year old female with history of recurrent urticaria, seasonal allergies, who is here for chronic sore throat for over 3 months, and evidence of chronic tonsillitis on CT neck performed in the ER.  She already had multiple courses of antibiotics and feels like they did help although she has residual discomfort, which is intermittent, does not think that there is any temporal relationship between eating and her symptoms.  On  her exam, she has residual tonsillar tissue bilaterally but no exudate or erythema, flexible laryngoscopy showed prominent lingual tonsils without purulence or signs of bacterial infection such as laryngitis or bacterial tonsillitis.  There was evidence of GERD LPR on flexible laryngoscopy, and she had nasal congestion with postnasal drainage on exam without purulence or polyps.  We discussed the impact of postnasal drainage and GERD LPR on chronic or recurrent sore throat.  I counseled the patient to try conservative management below and return if symptoms persist or worsen.   - start Clarinex and Flonase - start Famotidine and Reflux Gourmet - gargle with salt water in the morning - return in 3 months   Thank you for allowing me to participate in the care of this patient. Please do not hesitate to contact me with any questions or concerns.   Ashok Croon, MD Otolaryngology [facility] Phone: [phone] Fax:  [phone]    11/28/2022, 8:01 PM

## 2022-11-28 NOTE — Patient Instructions (Signed)
-   start Clarinex and Flonase - start Famotidine and Reflux Gourmet - return in 3 months

## 2022-12-10 ENCOUNTER — Ambulatory Visit (INDEPENDENT_AMBULATORY_CARE_PROVIDER_SITE_OTHER): Payer: 59 | Admitting: Family Medicine

## 2022-12-10 VITALS — BP 132/83 | HR 65 | Temp 97.7°F | Ht 63.0 in | Wt 202.0 lb

## 2022-12-10 DIAGNOSIS — Z6837 Body mass index (BMI) 37.0-37.9, adult: Secondary | ICD-10-CM

## 2022-12-10 DIAGNOSIS — E669 Obesity, unspecified: Secondary | ICD-10-CM

## 2022-12-10 DIAGNOSIS — E88819 Insulin resistance, unspecified: Secondary | ICD-10-CM

## 2022-12-10 DIAGNOSIS — E559 Vitamin D deficiency, unspecified: Secondary | ICD-10-CM

## 2022-12-10 DIAGNOSIS — Z6835 Body mass index (BMI) 35.0-35.9, adult: Secondary | ICD-10-CM | POA: Diagnosis not present

## 2022-12-10 DIAGNOSIS — E7849 Other hyperlipidemia: Secondary | ICD-10-CM | POA: Diagnosis not present

## 2022-12-10 DIAGNOSIS — E039 Hypothyroidism, unspecified: Secondary | ICD-10-CM

## 2022-12-10 NOTE — Progress Notes (Signed)
Julie Yang, D.O.  ABFM, ABOM Clinical Bariatric Medicine Physician  Office located at: [address] [place], [place]  [address]     Assessment and Plan:   Insulin resistance  Assessment & Plan:  I.R is new onset. Reports that her cravings are well controlled, however she sometimes has physical hunger at night. Labs show no evidence of prediabetes. Insulin levels are ~ 2.5 times normal. Kidney function, electrolytes, liver enzymes are essentially within normal limits. ALT levels are mildly elevated; I was unable to find prior abdominal ultrasound showing NAFLD. I anticipate levels to decrease with weight loss efforts. No evidence of anemia from blood counts. No concerns with B12 and Folate levels.   Lab Results  Component Value Date   HGBA1C 5.2 11/26/2022   HGBA1C 5.1 05/11/2021   INSULIN 13.2 11/26/2022   Lab Results  Component Value Date   CREATININE 0.67 11/26/2022   BUN 10 11/26/2022   NA 136 11/26/2022   K 4.6 11/26/2022   CL 103 11/26/2022   CO2 22 11/26/2022      Component Value Date/Time   PROT 7.4 11/26/2022 0945   ALBUMIN 4.5 11/26/2022 0945   AST 29 11/26/2022 0945   ALT 33 (H) 11/26/2022 0945   ALKPHOS 78 11/26/2022 0945   BILITOT 0.4 11/26/2022 0945   BILIDIR 0.1 10/24/2017 1551   IBILI 0.2 10/24/2017 1551    Lab Results  Component Value Date   WBC 8.4 11/26/2022   HGB 14.7 11/26/2022   HCT 43.9 11/26/2022   MCV 96 11/26/2022   PLT 364 11/26/2022   Lab Results  Component Value Date   VITAMINB12 986 11/26/2022   FOLATE 3.9 11/26/2022   Unless pre-existing renal or cardiopulmonary conditions exist which patient was told to limit their fluid intake by another provider, I recommended roughly one half of their weight in pounds, to be the approximate ounces of non-caloric, non-caffeinated beverages they should drink per day; including more if they are engaging in exercise.    I counseled patient on pathophysiology of the disease process of  I.R.  Explained role of simple carbs and insulin levels on hunger and cravings. Educated patient that having protein with each meal is important for stabilizing sugars and controlling hunger and cravings.  Continue to decrease simple carbs/ sugars; increase fiber and proteins -> follow her meal plan.    Other hyperlipidemia Assessment & Plan: Condition is new. LDL levels are elevated at 112. No concerns with total cholesterol, HDL, and TRIG levels. Additionally, I do not feel that statin therapy is indicated at this time since her ASCVD score is less than <5%.   Lab Results  Component Value Date   CHOL 180 11/26/2022   HDL 49 11/26/2022   LDLCALC 112 (H) 11/26/2022   TRIG 105 11/26/2022   CHOLHDL 4 05/14/2022   The 10-year ASCVD risk score (Arnett DK, et al., 2019) is: 1.2%   Values used to calculate the score:     Age: 52 years     Sex: Female     Is Non-Hispanic African American: No     Diabetic: No     Tobacco smoker: No     Systolic Blood Pressure: 132 mmHg     Is BP treated: No     HDL Cholesterol: 49 mg/dL     Total Cholesterol: 180 mg/dL  I stressed the importance that patient continue with our prudent nutritional plan that is low in saturated and trans fats, and low  in fatty carbs to improve these numbers. We also recommend: aerobic activity with eventual goal of a minimum of 150+ min wk plus 2 days/ week of resistance or strength training.    Vitamin D deficiency Assessment & Plan: Vitamin D levels are within the recommended range of 50 to 70-80. No issues with OTC Vitamin D 2,000 units daily, responding to it well.   Lab Results  Component Value Date   VD25OH 54.0 11/26/2022   VD25OH 43.5 09/26/2022   VD25OH 49.98 11/05/2019   Pt advised to maintain with OTC Vitamin D at current dose. Will recheck levels in 3-4 months.    Hypothyroidism, unspecified type Assessment & Plan: Hypothyroidism treated with Synthroid 75 mcg daily. TSH and FREET4 levels are stable. No  concerns in this regard.   Lab Results  Component Value Date   TSH 2.560 11/26/2022   FREET4 1.35 11/26/2022   Continue with Synthroid as prescribed by PCP. We will continue to monitor condition alongside them.    TREATMENT PLAN FOR OBESITY: BMI 37.0-37.9, adult- current bmi 35.79 Generalized obesity starting BMI 11/26/22 -36.85 Assessment & Plan: Julie Yang is here to discuss her progress with her obesity treatment plan along with follow-up of her obesity related diagnoses. See Medical Weight Management Flowsheet for complete bioelectrical impedance results.  Condition is improving.    Since last office visit patient's  Muscle mass has increased by 0.4 lb. Fat mass has decreased by 6.8 lb. Total body water has decreased by 2.8 lb.  Counseling done on how various foods will affect these numbers and how to maximize success  Total lbs lost to date: 6 lbs  Total weight loss percentage to date: 2.88%    Since pt likes to journal, she has been advised to begin journaling 1250-1350 calories and 95+ grams protein with Cat 2 meal plan with b/l options and 6-8 ounces of lean protein for dinner as a guide.   - Reviewed with pt how to accurately document their intake. I also recommended her to try the Edamame/Black Bean Spaghetti and Arlana Pouch Bread.   Behavioral Intervention Additional resources provided today:  Healthy Eating Principles Handout & Food Journaling Log Handout, & I.R Handout Evidence-based interventions for health behavior change were utilized today including the discussion of self monitoring techniques, problem-solving barriers and SMART goal setting techniques.   Regarding patient's less desirable eating habits and patterns, we employed the technique of small changes.  Pt will specifically work on: journaling her intake/bringing in food log for next visit.   FOLLOW UP: Return in about 2 weeks (around 12/24/2022). She was informed of the importance of frequent follow up  visits to maximize her success with intensive lifestyle modifications for her multiple health conditions.  Subjective:   Chief complaint: Obesity Julie Yang is here to discuss her progress with her obesity treatment plan. She is on the Category 2 Plan with B/L options and 6-8 ounces of lean protein at dinner and states she is following her eating plan approximately 50% of the time. She states she is walking 45 minutes 3 days per week and weight training 30 minutes 2 days a wk.   Interval History:  Julie Yang is here today for her first follow-up office visit since starting the program with [place].    - Reports following the meal plan approximately 50% of the time.   - She switched to journaling the other 50% of the time.   - Has been focusing on her protein intake and is also  measuring.   - Working on portion control when eating outside the home.   - Cravings are well controlled, reports sometimes having physical hunger at night.   All blood work/ lab tests that were recently ordered by myself or an outside provider were reviewed with patient today per their request. Extended time was spent counseling her on all new disease processes that were discovered or preexisting ones that are affected by BMI.  she understands that many of these abnormalities will need to monitored regularly along with the current treatment plan of prudent dietary changes, in which we are making each and every office visit, to improve these health parameters.  Review of Systems:  Pertinent positives were addressed with patient today.  Weight Summary and Biometrics   Weight Lost Since Last Visit: 6lb  Weight Gained Since Last Visit: 0lb   Vitals Temp: 97.7 F (36.5 C) BP: 132/83 Pulse Rate: 65 SpO2: 99 %   Anthropometric Measurements Height: 5\' 3"  (1.6 m) Weight: 202 lb (91.6 kg) BMI (Calculated): 35.79 Weight at Last Visit: 208lb Weight Lost Since Last Visit: 6lb Weight Gained Since Last Visit:  0lb Starting Weight: 208lb Total Weight Loss (lbs): 6 lb (2.722 kg) Peak Weight: 215lb   Body Composition  Body Fat %: 39.4 % Fat Mass (lbs): 79.6 lbs Muscle Mass (lbs): 116.2 lbs Total Body Water (lbs): 79.4 lbs Visceral Fat Rating : 10   Other Clinical Data Fasting: no Labs: no Today's Visit #: 2 Starting Date: 11/26/22   Objective:   PHYSICAL EXAM:  Blood pressure 132/83, pulse 65, temperature 97.7 F (36.5 C), height 5\' 3"  (1.6 m), weight 202 lb (91.6 kg), SpO2 99%. Body mass index is 35.78 kg/m.  General: Well Developed, well nourished, and in no acute distress.  HEENT: Normocephalic, atraumatic Skin: Warm and dry, cap RF less 2 sec, good turgor Chest:  Normal excursion, shape, no gross abn Respiratory: speaking in full sentences, no conversational dyspnea NeuroM-Sk: Ambulates w/o assistance, moves * 4 Psych: A and O *3, insight good, mood-full  DIAGNOSTIC DATA REVIEWED:  BMET    Component Value Date/Time   NA 136 11/26/2022 0945   K 4.6 11/26/2022 0945   CL 103 11/26/2022 0945   CO2 22 11/26/2022 0945   GLUCOSE 76 11/26/2022 0945   GLUCOSE 92 10/17/2022 1024   BUN 10 11/26/2022 0945   CREATININE 0.67 11/26/2022 0945   CREATININE 0.76 10/24/2017 1551   CALCIUM 9.3 11/26/2022 0945   GFRNONAA >60 10/17/2022 1024   GFRNONAA 95 10/24/2017 1551   GFRAA 123 12/09/2018 0929   GFRAA 111 10/24/2017 1551   Lab Results  Component Value Date   HGBA1C 5.2 11/26/2022   HGBA1C 5.1 05/11/2021   Lab Results  Component Value Date   INSULIN 13.2 11/26/2022   Lab Results  Component Value Date   TSH 2.560 11/26/2022   CBC    Component Value Date/Time   WBC 8.4 11/26/2022 0945   WBC 12.5 (H) 10/17/2022 1024   RBC 4.59 11/26/2022 0945   RBC 4.60 10/17/2022 1024   HGB 14.7 11/26/2022 0945   HCT 43.9 11/26/2022 0945   PLT 364 11/26/2022 0945   MCV 96 11/26/2022 0945   MCH 32.0 11/26/2022 0945   MCH 31.5 10/17/2022 1024   MCHC 33.5 11/26/2022 0945    MCHC 34.3 10/17/2022 1024   RDW 12.0 11/26/2022 0945   Iron Studies No results found for: "IRON", "TIBC", "FERRITIN", "IRONPCTSAT" Lipid Panel     Component Value Date/Time  CHOL 180 11/26/2022 0945   TRIG 105 11/26/2022 0945   HDL 49 11/26/2022 0945   CHOLHDL 4 05/14/2022 0908   VLDL 14.8 05/14/2022 0908   LDLCALC 112 (H) 11/26/2022 0945   Hepatic Function Panel     Component Value Date/Time   PROT 7.4 11/26/2022 0945   ALBUMIN 4.5 11/26/2022 0945   AST 29 11/26/2022 0945   ALT 33 (H) 11/26/2022 0945   ALKPHOS 78 11/26/2022 0945   BILITOT 0.4 11/26/2022 0945   BILIDIR 0.1 10/24/2017 1551   IBILI 0.2 10/24/2017 1551      Component Value Date/Time   TSH 2.560 11/26/2022 0945   Nutritional Lab Results  Component Value Date   VD25OH 54.0 11/26/2022   VD25OH 43.5 09/26/2022   VD25OH 49.98 11/05/2019    Attestations:   Reviewed by clinician on day of visit: allergies, medications, problem list, medical history, surgical history, family history, social history, and previous encounter notes.   Patient was in the office today and time spent on visit including pre-visit chart review and post-visit care/coordination of care and electronic medical record documentation was 65 minutes. 50% of the time was in face to face counseling of this patient's medical condition(s) and providing education on treatment options to include the first-line treatment of diet and lifestyle modification.  I, Special Randolm Idol, acting as a Stage manager for [employer], DO., have compiled all relevant documentation for today's office visit on behalf of Thomasene Lot, DO, while in the presence of [employer], DO.  I have reviewed the above documentation for accuracy and completeness, and I agree with the above. Julie Yang, D.O.  The 21st Century Cures Act was signed into law in 2016 which includes the topic of electronic health records.  This provides immediate access to information in  MyChart.  This includes consultation notes, operative notes, office notes, lab results and pathology reports.  If you have any questions about what you read please let us know at your next visit so we can discuss your concerns and take corrective action if need be.  We are right here with you.

## 2022-12-10 NOTE — Patient Instructions (Signed)
The 10-year ASCVD risk score (Arnett DK, et al., 2019) is: 1.2%   Values used to calculate the score:     Age: 52 years     Sex: Female     Is Non-Hispanic African American: No     Diabetic: No     Tobacco smoker: No     Systolic Blood Pressure: 132 mmHg     Is BP treated: No     HDL Cholesterol: 49 mg/dL     Total Cholesterol: 180 mg/dL

## 2022-12-25 ENCOUNTER — Ambulatory Visit (INDEPENDENT_AMBULATORY_CARE_PROVIDER_SITE_OTHER): Payer: 59 | Admitting: Family Medicine

## 2022-12-25 ENCOUNTER — Encounter (INDEPENDENT_AMBULATORY_CARE_PROVIDER_SITE_OTHER): Payer: Self-pay | Admitting: Family Medicine

## 2022-12-25 VITALS — BP 120/80 | HR 81 | Temp 98.1°F | Ht 63.0 in | Wt 200.0 lb

## 2022-12-25 DIAGNOSIS — E669 Obesity, unspecified: Secondary | ICD-10-CM

## 2022-12-25 DIAGNOSIS — Z6835 Body mass index (BMI) 35.0-35.9, adult: Secondary | ICD-10-CM

## 2022-12-25 DIAGNOSIS — E559 Vitamin D deficiency, unspecified: Secondary | ICD-10-CM | POA: Diagnosis not present

## 2022-12-25 DIAGNOSIS — Z6837 Body mass index (BMI) 37.0-37.9, adult: Secondary | ICD-10-CM

## 2022-12-25 DIAGNOSIS — I471 Supraventricular tachycardia, unspecified: Secondary | ICD-10-CM | POA: Diagnosis not present

## 2022-12-25 DIAGNOSIS — E88819 Insulin resistance, unspecified: Secondary | ICD-10-CM | POA: Diagnosis not present

## 2022-12-25 NOTE — Progress Notes (Signed)
[name], D.O.  ABFM, ABOM Specializing in Clinical Bariatric Medicine  Office located at: [address] [place], [place]  [address]     Assessment and Plan:    Insulin resistance - new onset Assessment: Condition is Not at goal.. This is diet/exercise controlled. She endorses having hunger and cravings. Her A1c levels are within normal range however, her insulin levels are elevated at 13.2.  Lab Results  Component Value Date   HGBA1C 5.2 11/26/2022   HGBA1C 5.1 05/11/2021   INSULIN 13.2 11/26/2022    Plan: - I stressed importance of dietary and lifestyle modifications to result in weight loss as first line txmnt. I also re-explained role of simple carbs and insulin levels on hunger and cravings  - Anticipatory guidance given.    Julie Yang will Yang to work on weight loss, exercise, via their meal plan we devised to help decrease the risk of progressing to prediabetes and we will recheck A1c and insulin level in approximately 3 months from last check, or as deemed appropriate.    Vitamin D deficiency Assessment: Condition is Controlled. This is now well controlled with OTC vitamin D. She has been tolerating this well and denies any adverse side effects.  Lab Results  Component Value Date   VD25OH 54.0 11/26/2022   VD25OH 43.5 09/26/2022   VD25OH 49.98 11/05/2019   Plan: - Yang with OTC vitamin D supplement.    - weight loss will likely improve availability of vitamin D, thus encouraged Julie Yang to further improve this condition.  Thus, we will need to monitor levels regularly (every 3-4 mo on average) to keep levels within normal limits and prevent over supplementation.   PSVT (paroxysmal supraventricular tachycardia) Assessment: Condition is Not optimized. Pt was last recommended to contact her PCP and see if she can discontinue Metoprolol. Her PCP declined and instructed that she stay on it unless  advised otherwise by her. Pt endorses feeling more heart palpitations lately and having to take her medication more frequently.   Plan: - Yang metoprolol 25mg  1/2 tablets as needed unless advised otherwise by her PCP.    TREATMENT PLAN FOR OBESITY: BMI 37.0-37.9, adult- current bmi 35.44 Generalized obesity starting BMI 11/26/22 -36.85 Assessment:  Julie Yang is here to discuss her progress with her obesity treatment plan along with follow-up of her obesity related diagnoses. See Medical Weight Management Flowsheet for complete bioelectrical impedance results.  Condition is not optimized. Biometric data collected today, was reviewed with patient.   Since last office visit on 12/10/2022 patient's  Muscle mass has decreased by 7lb. Fat mass has increased by 6lb. Total body water has decreased by 1.4lb.  Counseling done on how various foods will affect these numbers and how to maximize success  Total lbs lost to date: 8 Total weight loss percentage to date: 3.85%   Plan: - Julie Yang will work on healthier eating habits and Yang to try to follow the Category 2 Plan with breakfast and lunch options with 6-8oz of lean protein and keeping a food journal and adhering to recommended goals of 1250-1350 calories with 95+ protein best they can.   - I advised she limit her protein shake intake to one a day.   - I recommended Hershey sugar free chocolate milk, is this DOE NOT have aspartame.   - I also recommended that she try Victorias organic marinara as an alternative sauce since she loves pizza.  Behavioral Intervention Additional resources provided today: patient declined Evidence-based interventions for health behavior change were utilized today including the discussion of self monitoring techniques, problem-solving barriers and SMART goal setting techniques.   Regarding patient's less desirable eating habits and patterns, we employed the technique of small changes.  Pt will  specifically work on: Focus on the food in the prescribed meal plan and hit her calorie and protein intake 50% minimum of the time for next visit.     She has agreed to Yang current level of physical activity    FOLLOW UP: Return in about 3 weeks (around 01/15/2023).  She was informed of the importance of frequent follow up visits to maximize her success with intensive lifestyle modifications for her multiple health conditions.  Subjective:   Chief complaint: Obesity Julie Yang is here to discuss her progress with her obesity treatment plan. She is on the Category 2 Plan and keeping a food journal and adhering to recommended goals of 1250-1350 calories and 95+ protein and states she is following her eating plan approximately 50% of the time. She states she is walking and doing weights 40-60 minutes 6-7 days per week.  Interval History:  Julie Yang is here for a follow up office visit.     Since last office visit she has been doing well. She states she was doing well on the meal plan but fell off during vacation where she indulged in drinks and off plan foods. However, she did Yang to track her calorie and protein intake. She believes she may not be meeting her protein goals.    Review of Systems:  Pertinent positives were addressed with patient today.  Reviewed by clinician on day of visit: allergies, medications, problem list, medical history, surgical history, family history, social history, and previous encounter notes.  Weight Summary and Biometrics   Weight Lost Since Last Visit: 2lb  Weight Gained Since Last Visit: 0lb   Vitals Temp: 98.1 F (36.7 C) BP: 120/80 Pulse Rate: 81 SpO2: 98 %   Anthropometric Measurements Height: 5\' 3"  (1.6 m) Weight: 200 lb (90.7 kg) BMI (Calculated): 35.44 Weight at Last Visit: 202lb Weight Lost Since Last Visit: 2lb Weight Gained Since Last Visit: 0lb Starting Weight: 208lb Total Weight Loss (lbs): 8 lb (3.629 kg) Peak Weight:  215lb   Body Composition  Body Fat %: 42.7 % Fat Mass (lbs): 85.6 lbs Muscle Mass (lbs): 109.2 lbs Total Body Water (lbs): 78 lbs Visceral Fat Rating : 11   Other Clinical Data Fasting: no Labs: no Today's Visit #: 3 Starting Date: 11/26/22     Objective:   PHYSICAL EXAM: Blood pressure 120/80, pulse 81, temperature 98.1 F (36.7 C), height 5\' 3"  (1.6 m), weight 200 lb (90.7 kg), SpO2 98%. Body mass index is 35.43 kg/m.  General: Well Developed, well nourished, and in no acute distress.  HEENT: Normocephalic, atraumatic Skin: Warm and dry, cap RF less 2 sec, good turgor Chest:  Normal excursion, shape, no gross abn Respiratory: speaking in full sentences, no conversational dyspnea NeuroM-Sk: Ambulates w/o assistance, moves * 4 Psych: A and O *3, insight good, mood-full  DIAGNOSTIC DATA REVIEWED:  BMET    Component Value Date/Time   NA 136 11/26/2022 0945   K 4.6 11/26/2022 0945   CL 103 11/26/2022 0945   CO2 22 11/26/2022 0945   GLUCOSE 76 11/26/2022 0945   GLUCOSE 92 10/17/2022 1024   BUN 10 11/26/2022 0945   CREATININE 0.67 11/26/2022 0945   CREATININE 0.76 10/24/2017  1551   CALCIUM 9.3 11/26/2022 0945   GFRNONAA >60 10/17/2022 1024   GFRNONAA 95 10/24/2017 1551   GFRAA 123 12/09/2018 0929   GFRAA 111 10/24/2017 1551   Lab Results  Component Value Date   HGBA1C 5.2 11/26/2022   HGBA1C 5.1 05/11/2021   Lab Results  Component Value Date   INSULIN 13.2 11/26/2022   Lab Results  Component Value Date   TSH 2.560 11/26/2022   CBC    Component Value Date/Time   WBC 8.4 11/26/2022 0945   WBC 12.5 (H) 10/17/2022 1024   RBC 4.59 11/26/2022 0945   RBC 4.60 10/17/2022 1024   HGB 14.7 11/26/2022 0945   HCT 43.9 11/26/2022 0945   PLT 364 11/26/2022 0945   MCV 96 11/26/2022 0945   MCH 32.0 11/26/2022 0945   MCH 31.5 10/17/2022 1024   MCHC 33.5 11/26/2022 0945   MCHC 34.3 10/17/2022 1024   RDW 12.0 11/26/2022 0945   Iron Studies No results  found for: "IRON", "TIBC", "FERRITIN", "IRONPCTSAT" Lipid Panel     Component Value Date/Time   CHOL 180 11/26/2022 0945   TRIG 105 11/26/2022 0945   HDL 49 11/26/2022 0945   CHOLHDL 4 05/14/2022 0908   VLDL 14.8 05/14/2022 0908   LDLCALC 112 (H) 11/26/2022 0945   Hepatic Function Panel     Component Value Date/Time   PROT 7.4 11/26/2022 0945   ALBUMIN 4.5 11/26/2022 0945   AST 29 11/26/2022 0945   ALT 33 (H) 11/26/2022 0945   ALKPHOS 78 11/26/2022 0945   BILITOT 0.4 11/26/2022 0945   BILIDIR 0.1 10/24/2017 1551   IBILI 0.2 10/24/2017 1551      Component Value Date/Time   TSH 2.560 11/26/2022 0945   Nutritional Lab Results  Component Value Date   VD25OH 54.0 11/26/2022   VD25OH 43.5 09/26/2022   VD25OH 49.98 11/05/2019    Attestations:   I, Clinical biochemist, acting as a Stage manager for [employer], DO., have compiled all relevant documentation for today's office visit on behalf of Thomasene Lot, DO, while in the presence of [employer], DO.  I have reviewed the above documentation for accuracy and completeness, and I agree with the above. [name], D.O.  The 21st Century Cures Act was signed into law in 2016 which includes the topic of electronic health records.  This provides immediate access to information in MyChart.  This includes consultation notes, operative notes, office notes, lab results and pathology reports.  If you have any questions about what you read please let us know at your next visit so we can discuss your concerns and take corrective action if need be.  We are right here with you.

## 2022-12-26 ENCOUNTER — Other Ambulatory Visit: Payer: Self-pay | Admitting: Physician Assistant

## 2022-12-27 ENCOUNTER — Other Ambulatory Visit (HOSPITAL_COMMUNITY): Payer: Self-pay

## 2022-12-27 ENCOUNTER — Other Ambulatory Visit: Payer: Self-pay

## 2022-12-27 MED ORDER — SERTRALINE HCL 25 MG PO TABS
25.0000 mg | ORAL_TABLET | Freq: Every day | ORAL | 3 refills | Status: DC
  Filled 2022-12-27: qty 90, 90d supply, fill #0
  Filled 2023-04-18: qty 90, 90d supply, fill #1
  Filled 2023-07-30: qty 90, 90d supply, fill #2
  Filled 2023-11-03: qty 90, 90d supply, fill #3

## 2023-01-13 ENCOUNTER — Ambulatory Visit (INDEPENDENT_AMBULATORY_CARE_PROVIDER_SITE_OTHER): Payer: 59 | Admitting: Family Medicine

## 2023-01-27 ENCOUNTER — Ambulatory Visit (INDEPENDENT_AMBULATORY_CARE_PROVIDER_SITE_OTHER): Payer: 59 | Admitting: Family Medicine

## 2023-01-30 ENCOUNTER — Telehealth: Payer: Self-pay | Admitting: Gastroenterology

## 2023-01-30 ENCOUNTER — Ambulatory Visit (INDEPENDENT_AMBULATORY_CARE_PROVIDER_SITE_OTHER): Payer: 59

## 2023-01-30 DIAGNOSIS — Z23 Encounter for immunization: Secondary | ICD-10-CM | POA: Diagnosis not present

## 2023-01-30 NOTE — Telephone Encounter (Signed)
Good afternoon Dr. Tomasa Rand,    Supervising Provider PM 01/30/2023    We received a referral for patient to have a colonoscopy. Patient last had colonoscopy in 2019 with [facility]. Patient is requesting to transfer her care due to now being a [employer] employee and her insurance covers [employer]. Patient's previous record is in [facility] for you to review and advise on scheduling.     Thank you.

## 2023-01-31 ENCOUNTER — Other Ambulatory Visit: Payer: Self-pay

## 2023-01-31 ENCOUNTER — Other Ambulatory Visit (HOSPITAL_COMMUNITY): Payer: Self-pay

## 2023-01-31 ENCOUNTER — Telehealth: Payer: Self-pay | Admitting: Physician Assistant

## 2023-01-31 ENCOUNTER — Other Ambulatory Visit: Payer: Self-pay | Admitting: Physician Assistant

## 2023-01-31 DIAGNOSIS — E039 Hypothyroidism, unspecified: Secondary | ICD-10-CM

## 2023-01-31 MED ORDER — SYNTHROID 75 MCG PO TABS
75.0000 ug | ORAL_TABLET | Freq: Every day | ORAL | 0 refills | Status: DC
Start: 2023-01-31 — End: 2023-04-18
  Filled 2023-01-31: qty 90, 90d supply, fill #0

## 2023-01-31 NOTE — Telephone Encounter (Signed)
Prescription Request  01/31/2023  LOV: 10/10/2022  What is the name of the medication or equipment? SYNTHROID 75 MCG tablet   Have you contacted your pharmacy to request a refill? Yes   Which pharmacy would you like this sent to?  [place] [facility] [address] [place] [address] Phone: [phone] Fax: [phone]   Patient notified that their request is being sent to the clinical staff for review and that they should receive a response within 2 business days.   Please advise at Mobile [phone] (mobile)

## 2023-01-31 NOTE — Telephone Encounter (Signed)
Rx sent to pharmacy   

## 2023-02-07 ENCOUNTER — Telehealth: Payer: Self-pay

## 2023-02-07 NOTE — Telephone Encounter (Signed)
   Pre-operative Risk Assessment    Patient Name: Julie Yang  DOB: [birth year] MRN: [id number]     Request for Surgical Clearance    Procedure:  Colonoscopy   Date of Surgery:  Clearance 03/18/23                                 Surgeon:  Dr. Willis Modena Surgeon's Group or Practice Name:  [facility] Phone number:  [phone] Fax number:  [phone]   Type of Clearance Requested:   - Medical    Type of Anesthesia:  Propofol    Additional requests/questions:    Scarlette Shorts   02/07/2023, 3:13 PM

## 2023-02-10 ENCOUNTER — Ambulatory Visit (INDEPENDENT_AMBULATORY_CARE_PROVIDER_SITE_OTHER): Payer: 59 | Admitting: Family Medicine

## 2023-02-10 NOTE — Telephone Encounter (Signed)
Left message to call back to set up tele pre op appt.  

## 2023-02-10 NOTE — Telephone Encounter (Signed)
Primary Cardiologist:Kardie Tobb, DO   Preoperative team, please contact this patient and set up a phone call appointment for further preoperative risk assessment. Please obtain consent and complete medication review. Thank you for your help.   I confirm that guidance regarding antiplatelet and oral anticoagulation therapy has been completed and, if necessary, noted below.  I also confirmed the patient resides in the state of [place]. As per [facility] Medical Board telemedicine laws, the patient must reside in the state in which the provider is licensed.   Levi Aland, NP-C 02/10/2023, 8:55 AM [address] Office [phone] Fax [phone]

## 2023-02-11 ENCOUNTER — Encounter: Payer: Self-pay | Admitting: Gastroenterology

## 2023-02-12 NOTE — Telephone Encounter (Signed)
I will forward this to the pre op APP to review notes from Dr. Servando Salina, who has cleared the pt. Will forward to pre op for any final notes.

## 2023-02-12 NOTE — Telephone Encounter (Signed)
   Patient Name: Julie Yang  DOB: 02-12-1971 MRN: [id number]  Primary Cardiologist: Thomasene Ripple, DO  Chart reviewed as part of pre-operative protocol coverage. Pre-op clearance already addressed by colleagues in earlier phone notes. To summarize recommendations:  -The patient is cleared from a CV prospective for her upcoming colonoscopy.    The patient does not have any unstable cardiac conditions.  Upon evaluation today, she can achieve 4 METs or greater without anginal symptoms.  According to [facility] and AHA guidelines, she requires no further cardiac workup prior to her noncardiac surgery and should be at acceptable risk.  Our service is available as necessary in the perioperative period -Dr. Servando Salina  Will route this bundled recommendation to requesting provider via Epic fax function and remove from pre-op pool. Please call with questions.  Sharlene Dory, PA-C 02/12/2023, 10:14 AM

## 2023-02-12 NOTE — Telephone Encounter (Signed)
Team, The patient is cleared from a CV prospective for her upcoming colonoscopy.   The patient does not have any unstable cardiac conditions.  Upon evaluation today, she can achieve 4 METs or greater without anginal symptoms.  According to [facility] and AHA guidelines, she requires no further cardiac workup prior to her noncardiac surgery and should be at acceptable risk.  Our service is available as necessary in the perioperative period.

## 2023-02-28 ENCOUNTER — Encounter (INDEPENDENT_AMBULATORY_CARE_PROVIDER_SITE_OTHER): Payer: Self-pay | Admitting: Otolaryngology

## 2023-02-28 ENCOUNTER — Ambulatory Visit (INDEPENDENT_AMBULATORY_CARE_PROVIDER_SITE_OTHER): Payer: 59 | Admitting: Otolaryngology

## 2023-02-28 VITALS — BP 144/93 | HR 65

## 2023-02-28 DIAGNOSIS — R0982 Postnasal drip: Secondary | ICD-10-CM

## 2023-02-28 DIAGNOSIS — J343 Hypertrophy of nasal turbinates: Secondary | ICD-10-CM

## 2023-02-28 DIAGNOSIS — J029 Acute pharyngitis, unspecified: Secondary | ICD-10-CM

## 2023-02-28 DIAGNOSIS — J351 Hypertrophy of tonsils: Secondary | ICD-10-CM

## 2023-02-28 DIAGNOSIS — J3089 Other allergic rhinitis: Secondary | ICD-10-CM

## 2023-02-28 NOTE — Progress Notes (Signed)
ENT Progress Note  Update 02/28/2023 she reports complete symptom resolution after last office visit.  Doing well on Pepcid and Claritin.  Discontinued Flonase.  No longer gargles with salt water.  No pain with swallowing.  Initial Evaluation 11/28/22  Reason for Consult: persistent sore throat and pain with swallowing    HPI: Julie Yang is an 52 y.o. female with hx angioedema/hives and allergic rhinitis, hx of chronic urticaria, hypothyroidism on synthroid, here for evaluation of persistent sore throat and discomfort with swallowing.  She reports several visits with PCP for the same sx, and she got a course of Augmentin in May and Amox in June (abnormal but not positive Strep swab in June). She reports she had a lot of pain with swallowing, and had a hard time talking 2/2 pain. CT neck showed tonsillitis in ED 10/17/22, she received prednisone and Clindamycin. Her throat pain got better and then in a few weeks she started to have recurrent/persistent sore throat and discomfort. She does not think it is after she eats. Sx wean on and off now, and she denies hx of food allergies in the past.  She reports stress induced hives (started after she gave birth to her daughter), sx of whole body hives, but no dyspnea or lip/oral swelling, and takes Sulfasalazine (it helps) - in remission right now without any recent episodes.   She had bad reflux while on Prednisone. Never had tonsillectomy. Had cautery of the nasal vessel for nose bleed at the age 58. She has seasonal allergies, at the turn of the season, end of May and end of November. Self-treats with Claritin or Allegra. It helps.   Records Reviewed:  ED provider note from [facility] 10/17/2022  Julie Yang is a 52 y.o. female with history of chronic autoimmune urticaria, thyroid disease, anxiety, depression who presents the emergency department with concern for ongoing sore throat.  Patient has been having right-sided neck, throat and ear pain  for the past month.  She initially was diagnosed with strep throat, given antibiotics, then was also treated for an ear infection.  Has been referred to ENT.  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  Not having fever.   A/P The plan is: discharge to home with ENT follow up for ongoing/chronic tonsillitis. No abscess requiring drainage or emergent consult today. Tolerating her own secretions, no stridor or difficulty breathing. The patient is safe for discharge and has been instructed to return immediately for worsening symptoms, change in symptoms or any other concerns.    Past Medical History:  Diagnosis Date   Abnormal Pap smear of cervix    1990s--hx of cryotherapy to cervix   Allergy 2009   All are related to the chronic urticaria   Anxiety    COVID-19 virus infection 10/2019   Depression    Genital warts    GERD (gastroesophageal reflux disease)    Hypertension    Hypothyroidism    Joint pain    Thyroid disease 2013   Urticaria    chronic autoimmune urticaria   Vitamin D deficiency     Past Surgical History:  Procedure Laterality Date   CESAREAN SECTION     CYST EXCISION     from chest, benign    Family History  Problem Relation Age of Onset   Atrial fibrillation Mother    Heart disease Mother    Hypertension  Mother    Hyperlipidemia Mother    Anxiety disorder Mother    Obesity Mother    Heart disease Father    Hypertension Father    Hyperlipidemia Father    Eczema Father    Diabetes Father    Sleep apnea Father    Crohn's disease Brother    Other Daughter        pre-diabetes   Colon cancer Maternal Uncle 40   Diabetes Maternal Uncle    Stroke Maternal Uncle    Dementia Maternal Uncle    Heart disease Maternal Grandmother    Diabetes Maternal Grandmother    Breast cancer Maternal Grandmother 6   Hypertension Maternal Grandmother    Stroke  Maternal Grandmother    Arthritis Maternal Grandmother    Dementia Maternal Grandmother    Heart disease Maternal Grandfather    Lung cancer Maternal Grandfather    Colon cancer Maternal Grandfather 100   Brain cancer Maternal Grandfather    Hypertension Maternal Grandfather    Heart attack Maternal Grandfather    Prostate cancer Maternal Grandfather    Heart disease Paternal Grandmother    Hypertension Paternal Grandmother    Heart disease Paternal Grandfather    Hypertension Paternal Grandfather    Heart attack Paternal Grandfather    Allergic rhinitis Neg Hx    Angioedema Neg Hx    Asthma Neg Hx    Immunodeficiency Neg Hx    Urticaria Neg Hx     Social History:  reports that she has never smoked. She has never used smokeless tobacco. She reports current alcohol use. She reports that she does not use drugs.  Allergies:  Allergies  Allergen Reactions   Cortisone Hives   Prednisone Hives   Aspirin Hives    Exacerbates hives   Ibuprofen Hives    Exacerbates hives   Iodine Hives   Povidone Iodine Rash    Medications: I have reviewed the patient's current medications.  The PMH, PSH, Medications, Allergies, and SH were reviewed and updated.  ROS: Constitutional: Negative for fever, weight loss and weight gain. Cardiovascular: Negative for chest pain and dyspnea on exertion. Respiratory: Is not experiencing shortness of breath at rest. Gastrointestinal: Negative for nausea and vomiting. Neurological: Negative for headaches. Psychiatric: The patient is not nervous/anxious  Blood pressure (!) 144/93, pulse 65, SpO2 100%.  PHYSICAL EXAM:  Exam: General: Well-developed, well-nourished Communication and Voice: Clear pitch and clarity Respiratory Respiratory effort: Equal inspiration and expiration without stridor Cardiovascular Peripheral Vascular: Warm extremities with equal color/perfusion Eyes: No nystagmus with equal extraocular motion  bilaterally Neuro/Psych/Balance: Patient oriented to person, place, and time; Appropriate mood and affect; Gait is intact with no imbalance; Cranial nerves I-XII are intact Head and Face Inspection: Normocephalic and atraumatic without mass or lesion Facial Strength: Facial motility symmetric and full bilaterally ENT Pinna: External ear intact and fully developed External canal: Canal is patent with intact skin Tympanic Membrane: Clear and mobile External Nose: No scar or anatomic deformity Internal Nose: Septum is straight on anterior rhinoscopy. No polyp, or purulence. Mucosal edema and erythema present.  Bilateral inferior turbinate hypertrophy.  Lips, Teeth, and gums: Mucosa and teeth intact and viable TMJ: No pain to palpation with full mobility Oral cavity/oropharynx: No erythema or exudate, no lesions present. 1+ tonsils.  Neck Trachea midline no lymphadenopathy  Studies Reviewed: CT neck with contrast done in ED 10/17/22   FINDINGS: Pharynx and larynx: The epiglottis is normal in appearance. Bilateral palatine and adenoid tonsils are normal in  appearance. There is mild hyperenhancement of the lingual tonsils (series 2, image 59). No evidence of soft tissue stranding in the parapharyngeal spaces. There is mild mucosal contrast enhancement in the oropharynx, which can be seen in the setting of pharyngitis.   Salivary glands: No inflammation, mass, or stone.   Thyroid: Normal.   Lymph nodes: There are multiple prominent symmetric cervical lymph nodes, which are favored to be reactive.   Vascular: Negative.   Limited intracranial: Negative.   Visualized orbits: Negative.   Mastoids and visualized paranasal sinuses: Trace mucosal thickening in the left sphenoid sinus. No mastoid or middle ear effusion. Orbits are unremarkable   Skeleton: No acute or aggressive process.   Upper chest: Negative.   Other: No focal abnormality is noted in the region of the skin marker  placed in the right submandibular region   IMPRESSION: 1. Mild hyperenhancement of the lingual tonsils, which can be seen in the setting of tonsillitis. No evidence of abscess. 2. Mild mucosal contrast enhancement in the oropharynx, which can be seen in the setting of pharyngitis. 3. Multiple prominent symmetric cervical lymph nodes, which are favored to be reactive. 4. No focal abnormality is noted in the region of the skin marker placed in the right submandibular region.      Assessment/Plan: Encounter Diagnoses  Name Primary?   Sore throat Yes   Tonsillar hypertrophy    Environmental and seasonal allergies    Hypertrophy of inferior nasal turbinate    Post-nasal drainage     52 year old female with history of recurrent urticaria, seasonal allergies, who is here for chronic sore throat for over 3 months, and evidence of chronic tonsillitis on CT neck performed in the ER.  She already had multiple courses of antibiotics and feels like they did help although she has residual discomfort, which is intermittent, does not think that there is any temporal relationship between eating and her symptoms.  On her exam, she has residual tonsillar tissue bilaterally but no exudate or erythema, flexible laryngoscopy showed prominent lingual tonsils without purulence or signs of bacterial infection such as laryngitis or bacterial tonsillitis.  There was evidence of GERD LPR on flexible laryngoscopy, and she had nasal congestion with postnasal drainage on exam without purulence or polyps.  We discussed the impact of postnasal drainage and GERD LPR on chronic or recurrent sore throat.  I counseled the patient to try conservative management below and return if symptoms persist or worsen.   - start Clarinex and Flonase - start Famotidine and Reflux Gourmet - gargle with salt water in the morning - return in 3 months   Update 02/28/2023 she reports that her symptoms cleared up after last office visit.  She has been doing Pepcid and Claritin over the counter. Stopped Flonase. Stopped throat gargles.  Exam with +1 tonsils today no exudate or erythema.  Patient is asymptomatic  Plan: -Continue Claritin 10 mg daily -Slowly wean off Pepcid and continue reflux Gourmet -Return as needed    Ashok Croon, MD Otolaryngology [facility] Phone: [phone] Fax: [phone]    02/28/2023, 9:06 AM

## 2023-03-11 ENCOUNTER — Ambulatory Visit (AMBULATORY_SURGERY_CENTER): Payer: 59

## 2023-03-11 VITALS — Ht 63.0 in | Wt 200.0 lb

## 2023-03-11 DIAGNOSIS — Z860100 Personal history of colon polyps, unspecified: Secondary | ICD-10-CM

## 2023-03-11 MED ORDER — NA SULFATE-K SULFATE-MG SULF 17.5-3.13-1.6 GM/177ML PO SOLN
1.0000 | Freq: Once | ORAL | 0 refills | Status: AC
Start: 2023-03-11 — End: 2023-03-13
  Filled 2023-03-11: qty 354, 1d supply, fill #0

## 2023-03-11 NOTE — Progress Notes (Signed)

## 2023-03-12 ENCOUNTER — Other Ambulatory Visit (HOSPITAL_COMMUNITY): Payer: Self-pay

## 2023-03-12 ENCOUNTER — Other Ambulatory Visit: Payer: Self-pay

## 2023-03-13 ENCOUNTER — Other Ambulatory Visit (HOSPITAL_COMMUNITY)
Admission: RE | Admit: 2023-03-13 | Discharge: 2023-03-13 | Disposition: A | Discharge status: Home / Self Care | Payer: 59 | Source: Ambulatory Visit | Attending: Radiology | Admitting: Radiology

## 2023-03-13 ENCOUNTER — Encounter: Payer: Self-pay | Admitting: Radiology

## 2023-03-13 ENCOUNTER — Ambulatory Visit (INDEPENDENT_AMBULATORY_CARE_PROVIDER_SITE_OTHER): Payer: 59 | Admitting: Radiology

## 2023-03-13 ENCOUNTER — Encounter: Payer: Self-pay | Admitting: Gastroenterology

## 2023-03-13 ENCOUNTER — Ambulatory Visit: Payer: 59 | Admitting: Radiology

## 2023-03-13 VITALS — BP 108/76 | Ht 62.75 in | Wt 205.0 lb

## 2023-03-13 DIAGNOSIS — Z01419 Encounter for gynecological examination (general) (routine) without abnormal findings: Secondary | ICD-10-CM | POA: Insufficient documentation

## 2023-03-13 NOTE — Progress Notes (Signed)
Julie Yang 07-15-1970 [id number]   History:  52 y.o. G1P1 presents for annual exam. Doing well. Perimenopausal with some symptoms. Mostly brain fog. No hot flashes.   Gynecologic History Patient's last menstrual period was 01/28/2023 (exact date). Period Duration (Days): 4 Period Pattern: (!) Irregular (skipping periods, lasting 4-7 days) Menstrual Flow: Light, Moderate, Heavy Menstrual Control: Tampon, Maxi pad, Thin pad Dysmenorrhea: None Contraception/Family planning: vasectomy Sexually active: yes Last Pap: 2021. Results were: normal Last mammogram: 2024. Results were: normal Colonoscopy: 2019, has repeat scheduled next month  Obstetric History OB History  Gravida Para Term Preterm AB Living  1 1 1     1   SAB IAB Ectopic Multiple Live Births               # Outcome Date GA Lbr Len/2nd Weight Sex Type Anes PTL Lv  1 Term             The following portions of the patient's history were reviewed and updated as appropriate: allergies, current medications, past family history, past medical history, past social history, past surgical history, and problem list.  Review of Systems  All other systems reviewed and are negative.   Past medical history, past surgical history, family history and social history were all reviewed and documented in the EPIC chart.  Exam:  Vitals:   03/13/23 1133  BP: 108/76  Weight: 205 lb (93 kg)  Height: 5' 2.75" (1.594 m)   Body mass index is 36.6 kg/m.  Physical Exam Vitals and nursing note reviewed. Exam conducted with a chaperone present.  Constitutional:      Appearance: Normal appearance. She is normal weight.  HENT:     Head: Normocephalic and atraumatic.  Neck:     Thyroid: No thyroid mass, thyromegaly or thyroid tenderness.  Cardiovascular:     Rate and Rhythm: Regular rhythm.     Heart sounds: Normal heart sounds.  Pulmonary:     Effort: Pulmonary effort is normal.     Breath sounds: Normal breath sounds.  Chest:   Breasts:    Breasts are symmetrical.     Right: Normal. No inverted nipple, mass, nipple discharge, skin change or tenderness.     Left: Normal. No inverted nipple, mass, nipple discharge, skin change or tenderness.  Abdominal:     General: Abdomen is flat. Bowel sounds are normal.     Palpations: Abdomen is soft.  Genitourinary:    General: Normal vulva.     Vagina: Normal. No vaginal discharge, bleeding or lesions.     Cervix: Normal. No discharge or lesion.     Uterus: Normal. Not enlarged and not tender.      Adnexa: Right adnexa normal and left adnexa normal.       Right: No mass, tenderness or fullness.         Left: No mass, tenderness or fullness.    Lymphadenopathy:     Upper Body:     Right upper body: No axillary adenopathy.     Left upper body: No axillary adenopathy.  Skin:    General: Skin is warm and dry.  Neurological:     Mental Status: She is alert and oriented to person, place, and time.  Psychiatric:        Mood and Affect: Mood normal.        Thought Content: Thought content normal.        Judgment: Judgment normal.      [name], CMA present for exam  Assessment/Plan:   1. Well woman exam with routine gynecological exam - Mammo yearly - Labs with PCP - May try neuro  mag for cognition - Discussed option of HRT, will consider - Cytology - PAP( [facility])    Discussed SBE, colonoscopy and pap screening as directed/appropriate. Recommend of exercise weekly, including weight bearing exercise. Encouraged the use of seatbelts and sunscreen.  Return in about 1 year (around 03/12/2024) for Annual.  Arlie Solomons B WHNP-BC 12:22 PM 03/13/2023

## 2023-03-13 NOTE — Patient Instructions (Signed)
Preventive Care 40-50 Years Old, Female Preventive care refers to lifestyle choices and visits with your health care provider that can promote health and wellness. Preventive care visits are also called wellness exams. What can I expect for my preventive care visit? Counseling Your health care provider may ask you questions about your: Medical history, including: Past medical problems. Family medical history. Pregnancy history. Current health, including: Menstrual cycle. Method of birth control. Emotional well-being. Home life and relationship well-being. Sexual activity and sexual health. Lifestyle, including: Alcohol, nicotine or tobacco, and drug use. Access to firearms. Diet, exercise, and sleep habits. Work and work environment. Sunscreen use. Safety issues such as seatbelt and bike helmet use. Physical exam Your health care provider will check your: Height and weight. These may be used to calculate your BMI (body mass index). BMI is a measurement that tells if you are at a healthy weight. Waist circumference. This measures the distance around your waistline. This measurement also tells if you are at a healthy weight and may help predict your risk of certain diseases, such as type 2 diabetes and high blood pressure. Heart rate and blood pressure. Body temperature. Skin for abnormal spots. What immunizations do I need?  Vaccines are usually given at various ages, according to a schedule. Your health care provider will recommend vaccines for you based on your age, medical history, and lifestyle or other factors, such as travel or where you work. What tests do I need? Screening Your health care provider may recommend screening tests for certain conditions. This may include: Lipid and cholesterol levels. Diabetes screening. This is done by checking your blood sugar (glucose) after you have not eaten for a while (fasting). Pelvic exam and Pap test. Hepatitis B test. Hepatitis C  test. HIV (human immunodeficiency virus) test. STI (sexually transmitted infection) testing, if you are at risk. Lung cancer screening. Colorectal cancer screening. Mammogram. Talk with your health care provider about when you should start having regular mammograms. This may depend on whether you have a family history of breast cancer. BRCA-related cancer screening. This may be done if you have a family history of breast, ovarian, tubal, or peritoneal cancers. Bone density scan. This is done to screen for osteoporosis. Talk with your health care provider about your test results, treatment options, and if necessary, the need for more tests. Follow these instructions at home: Eating and drinking  Eat a diet that includes fresh fruits and vegetables, whole grains, lean protein, and low-fat dairy products. Take vitamin and mineral supplements as recommended by your health care provider. Do not drink alcohol if: Your health care provider tells you not to drink. You are pregnant, may be pregnant, or are planning to become pregnant. If you drink alcohol: Limit how much you have to 0-1 drink a day. Know how much alcohol is in your drink. In the [place] one drink equals one 12 oz bottle of beer (355 mL), one 5 oz glass of wine (148 mL), or one 1 oz glass of hard liquor (44 mL). Lifestyle Brush your teeth every morning and night with fluoride toothpaste. Floss one time each day. Exercise for at least 30 minutes 5 or more days each week. Do not use any products that contain nicotine or tobacco. These products include cigarettes, chewing tobacco, and vaping devices, such as e-cigarettes. If you need help quitting, ask your health care provider. Do not use drugs. If you are sexually active, practice safe sex. Use a condom or other form of protection to   prevent STIs. If you do not wish to become pregnant, use a form of birth control. If you plan to become pregnant, see your health care provider for a  prepregnancy visit. Take aspirin only as told by your health care provider. Make sure that you understand how much to take and what form to take. Work with your health care provider to find out whether it is safe and beneficial for you to take aspirin daily. Find healthy ways to manage stress, such as: Meditation, yoga, or listening to music. Journaling. Talking to a trusted person. Spending time with friends and family. Minimize exposure to UV radiation to reduce your risk of skin cancer. Safety Always wear your seat belt while driving or riding in a vehicle. Do not drive: If you have been drinking alcohol. Do not ride with someone who has been drinking. When you are tired or distracted. While texting. If you have been using any mind-altering substances or drugs. Wear a helmet and other protective equipment during sports activities. If you have firearms in your house, make sure you follow all gun safety procedures. Seek help if you have been physically or sexually abused. What's next? Visit your health care provider once a year for an annual wellness visit. Ask your health care provider how often you should have your eyes and teeth checked. Stay up to date on all vaccines. This information is not intended to replace advice given to you by your health care provider. Make sure you discuss any questions you have with your health care provider. Document Revised: 10/11/2020 Document Reviewed: 10/11/2020 Elsevier Patient Education  2024 [employer]  

## 2023-03-14 LAB — CYTOLOGY - PAP
Comment: NEGATIVE
Diagnosis: NEGATIVE
High risk HPV: NEGATIVE

## 2023-04-01 ENCOUNTER — Encounter: Payer: 59 | Admitting: Gastroenterology

## 2023-04-14 ENCOUNTER — Encounter: Payer: Self-pay | Admitting: Gastroenterology

## 2023-04-15 NOTE — Telephone Encounter (Signed)
Reassure spotting  can be normal in perimenopause. If it continues for another week schedule OV

## 2023-04-16 ENCOUNTER — Ambulatory Visit: Payer: 59 | Admitting: Gastroenterology

## 2023-04-16 ENCOUNTER — Encounter: Payer: Self-pay | Admitting: Gastroenterology

## 2023-04-16 VITALS — BP 101/64 | HR 61 | Temp 97.5°F | Resp 7 | Ht 63.0 in | Wt 200.0 lb

## 2023-04-16 DIAGNOSIS — K5730 Diverticulosis of large intestine without perforation or abscess without bleeding: Secondary | ICD-10-CM

## 2023-04-16 DIAGNOSIS — Z1211 Encounter for screening for malignant neoplasm of colon: Secondary | ICD-10-CM

## 2023-04-16 DIAGNOSIS — K644 Residual hemorrhoidal skin tags: Secondary | ICD-10-CM | POA: Diagnosis not present

## 2023-04-16 DIAGNOSIS — F32A Depression, unspecified: Secondary | ICD-10-CM | POA: Diagnosis not present

## 2023-04-16 DIAGNOSIS — Z860101 Personal history of adenomatous and serrated colon polyps: Secondary | ICD-10-CM | POA: Diagnosis not present

## 2023-04-16 DIAGNOSIS — F419 Anxiety disorder, unspecified: Secondary | ICD-10-CM | POA: Diagnosis not present

## 2023-04-16 DIAGNOSIS — Z860100 Personal history of colon polyps, unspecified: Secondary | ICD-10-CM

## 2023-04-16 MED ORDER — SODIUM CHLORIDE 0.9 % IV SOLN: 500.0000 mL | Freq: Once | INTRAVENOUS | Status: AC

## 2023-04-16 NOTE — Patient Instructions (Addendum)
Recommendation:  - Patient has a contact number available for emergencies. The signs and symptoms of potential delayed complications were discussed with the patient. Return to normal activities tomorrow. Written discharge instructions were provided to the patient.  - Resume previous diet.  - Continue present medications.  - Repeat colonoscopy in 7 years for surveillance.   YOU HAD AN ENDOSCOPIC PROCEDURE TODAY AT THE [facility]:   Refer to the procedure report that was given to you for any specific questions about what was found during the examination.  If the procedure report does not answer your questions, please call your gastroenterologist to clarify.  If you requested that your care partner not be given the details of your procedure findings, then the procedure report has been included in a sealed envelope for you to review at your convenience later.  YOU SHOULD EXPECT: Some feelings of bloating in the abdomen. Passage of more gas than usual.  Walking can help get rid of the air that was put into your GI tract during the procedure and reduce the bloating. If you had a lower endoscopy (such as a colonoscopy or flexible sigmoidoscopy) you may notice spotting of blood in your stool or on the toilet paper. If you underwent a bowel prep for your procedure, you may not have a normal bowel movement for a few days.  Please Note:  You might notice some irritation and congestion in your nose or some drainage.  This is from the oxygen used during your procedure.  There is no need for concern and it should clear up in a day or so.  SYMPTOMS TO REPORT IMMEDIATELY:  Following lower endoscopy (colonoscopy or flexible sigmoidoscopy):  Excessive amounts of blood in the stool  Significant tenderness or worsening of abdominal pains  Swelling of the abdomen that is new, acute  Fever of 100F or higher   For urgent or emergent issues, a gastroenterologist can be reached at any hour by calling  [phone]. Do not use MyChart messaging for urgent concerns.    DIET:  We do recommend a small meal at first, but then you may proceed to your regular diet.  Drink plenty of fluids but you should avoid alcoholic beverages for 24 hours.  MEDICATIONS: Continue present medications.  Handouts given to patient: Diverticulosis.  FOLLOW UP: Repeat colonoscopy in 7 years for surveillance.  ACTIVITY:  You should plan to take it easy for the rest of today and you should NOT DRIVE or use heavy machinery until tomorrow (because of the sedation medicines used during the test).    FOLLOW UP: Our staff will call the number listed on your records the next business day following your procedure.  We will call around 7:15- 8:00 am to check on you and address any questions or concerns that you may have regarding the information given to you following your procedure. If we do not reach you, we will leave a message.     If any biopsies were taken you will be contacted by phone or by letter within the next 1-3 weeks.  Please call us at [phone] if you have not heard about the biopsies in 3 weeks.    SIGNATURES/CONFIDENTIALITY: You and/or your care partner have signed paperwork which will be entered into your electronic medical record.  These signatures attest to the fact that that the information above on your After Visit Summary has been reviewed and is understood.  Full responsibility of the confidentiality of this discharge information lies  with you and/or your care-partner.

## 2023-04-16 NOTE — Op Note (Signed)
[facility] Patient Name: Julie Yang Procedure Date: 04/16/2023 8:02 AM MRN: [id number] Endoscopist: Lorin Picket E. Tomasa Rand , MD, [id number] Age: 52 Referring MD:  Date of Birth: 03/05/1971 Gender: Female Account #: [id number] Procedure:                Colonoscopy Indications:              Surveillance: Personal history of adenomatous                            polyps on last colonoscopy 5 years ago Medicines:                Monitored Anesthesia Care Procedure:                Pre-Anesthesia Assessment:                           - Prior to the procedure, a History and Physical                            was performed, and patient medications and                            allergies were reviewed. The patient's tolerance of                            previous anesthesia was also reviewed. The risks                            and benefits of the procedure and the sedation                            options and risks were discussed with the patient.                            All questions were answered, and informed consent                            was obtained. Prior Anticoagulants: The patient has                            taken no anticoagulant or antiplatelet agents. ASA                            Grade Assessment: II - A patient with mild systemic                            disease. After reviewing the risks and benefits,                            the patient was deemed in satisfactory condition to                            undergo the procedure.  After obtaining informed consent, the colonoscope                            was passed under direct vision. Throughout the                            procedure, the patient's blood pressure, pulse, and                            oxygen saturations were monitored continuously. The                            Olympus CF-HQ190L ([id number]) Colonoscope was                            introduced through  the anus and advanced to the the                            terminal ileum, with identification of the                            appendiceal orifice and IC valve. The colonoscopy                            was performed without difficulty. The patient                            tolerated the procedure well. The quality of the                            bowel preparation was excellent. The terminal                            ileum, ileocecal valve, appendiceal orifice, and                            rectum were photographed. The bowel preparation                            used was SUPREP via split dose instruction. Scope In: 8:07:43 AM Scope Out: 8:18:02 AM Scope Withdrawal Time: 0 hours 6 minutes 8 seconds  Total Procedure Duration: 0 hours 10 minutes 19 seconds  Findings:                 Skin tags were found on perianal exam.                           The digital rectal exam was normal. Pertinent                            negatives include normal sphincter tone and no                            palpable rectal lesions.  A few small-mouthed diverticula were found in the                            sigmoid colon and descending colon.                           The exam was otherwise normal throughout the                            examined colon.                           The terminal ileum appeared normal.                           The retroflexed view of the distal rectum and anal                            verge was normal and showed no anal or rectal                            abnormalities. Complications:            No immediate complications. Estimated Blood Loss:     Estimated blood loss: none. Impression:               - Perianal skin tags found on perianal exam.                           - Mild diverticulosis in the sigmoid colon and in                            the descending colon.                           - The examined portion of the ileum was  normal.                           - The distal rectum and anal verge are normal on                            retroflexion view.                           - No specimens collected. Recommendation:           - Patient has a contact number available for                            emergencies. The signs and symptoms of potential                            delayed complications were discussed with the                            patient. Return to normal activities tomorrow.  Written discharge instructions were provided to the                            patient.                           - Resume previous diet.                           - Continue present medications.                           - Repeat colonoscopy in 7 years for surveillance. Julie Yang E. Tomasa Rand, MD 04/16/2023 8:24:02 AM This report has been signed electronically.

## 2023-04-16 NOTE — Progress Notes (Signed)
Sedate, gd SR, tolerated procedure well, VSS, report to RN 

## 2023-04-16 NOTE — Progress Notes (Signed)
Pt's states no medical or surgical changes since previsit or office visit. 

## 2023-04-16 NOTE — Progress Notes (Signed)
[facility] History and Physical   Primary Care Physician:  Jarold Motto, [place]   Reason for Procedure:   Colon cancer screening  Plan:    Screening colonoscopy     HPI: Julie Yang is a 52 y.o. female undergoing surveillance colonoscopy.  Her maternal uncle and grandfather both had colon cancer.  Her last colonoscopy was in 2019 and a single tubular adenoma was removed.     Past Medical History:  Diagnosis Date   Abnormal Pap smear of cervix    1990s--hx of cryotherapy to cervix   Allergy 2009   All are related to the chronic urticaria   Anxiety    COVID-19 virus infection 10/2019   Depression    Genital warts    GERD (gastroesophageal reflux disease)    Hypertension    Hypothyroidism    Joint pain    PSVT (paroxysmal supraventricular tachycardia) (HCC)    Thyroid disease 2013   Urticaria    chronic autoimmune urticaria   Vitamin D deficiency     Past Surgical History:  Procedure Laterality Date   CESAREAN SECTION     COLONOSCOPY     CYST EXCISION     from chest, benign    Prior to Admission medications   Medication Sig Start Date End Date Taking? Authorizing Provider  Cholecalciferol (VITAMIN D) 50 MCG (2000 UT) CAPS Take 2,000 Units by mouth daily.   Yes [provider]  metoprolol tartrate (LOPRESSOR) 25 MG tablet Take 1/2 tablet (12.5 mg total) by mouth every 8 hours as needed (HR >140 bpm ( lasting over 10 mins) and Palpitations > 10 mins : Hold for SBP < 100 mmHg.). 11/01/22  Yes Tobb, Kardie, DO  PROBIOTIC PRODUCT PO Take by mouth.   Yes [provider]  sertraline (ZOLOFT) 25 MG tablet Take 1 tablet (25 mg total) by mouth daily. 12/27/22  Yes Jarold Motto, PA  SYNTHROID 75 MCG tablet Take 1 tablet (75 mcg total) by mouth daily before breakfast. 01/31/23  Yes Jarold Motto, PA  ALPRAZolam Prudy Feeler) 0.25 MG tablet Take 1 tablet (0.25 mg total) by mouth at bedtime as needed for anxiety. 10/26/21   Jarold Motto, PA   famotidine (PEPCID) 20 MG tablet Take 1 tablet (20 mg total) by mouth 2 (two) times daily. 11/28/22   Ashok Croon, MD  Loratadine (CLARITIN PO) Take by mouth.    [provider]  triamcinolone ointment (KENALOG) 0.5 % Apply 1 Application topically to affected area 1-2 times daily. 09/04/22   Jarold Motto, PA    Current Outpatient Medications  Medication Sig Dispense Refill   Cholecalciferol (VITAMIN D) 50 MCG (2000 UT) CAPS Take 2,000 Units by mouth daily.     metoprolol tartrate (LOPRESSOR) 25 MG tablet Take 1/2 tablet (12.5 mg total) by mouth every 8 hours as needed (HR >140 bpm ( lasting over 10 mins) and Palpitations > 10 mins : Hold for SBP < 100 mmHg.). 45 tablet 3   PROBIOTIC PRODUCT PO Take by mouth.     sertraline (ZOLOFT) 25 MG tablet Take 1 tablet (25 mg total) by mouth daily. 90 tablet 3   SYNTHROID 75 MCG tablet Take 1 tablet (75 mcg total) by mouth daily before breakfast. 90 tablet 0   ALPRAZolam (XANAX) 0.25 MG tablet Take 1 tablet (0.25 mg total) by mouth at bedtime as needed for anxiety. 20 tablet 0   famotidine (PEPCID) 20 MG tablet Take 1 tablet (20 mg total) by mouth 2 (two) times daily. 30  tablet 1   Loratadine (CLARITIN PO) Take by mouth.     triamcinolone ointment (KENALOG) 0.5 % Apply 1 Application topically to affected area 1-2 times daily. 30 g 2   Current Facility-Administered Medications  Medication Dose Route Frequency Provider Last Rate Last Admin   0.9 %  sodium chloride infusion  500 mL Intravenous Once Julie Lucks, MD        Allergies as of 04/16/2023 - Review Complete 04/16/2023  Allergen Reaction Noted   Cortisone Hives 09/22/2020   Prednisone Hives 11/14/2016   Aspirin Hives 09/19/2015   Ibuprofen Hives 09/19/2015   Iodine Hives 09/19/2015   Povidone iodine Rash     Family History  Problem Relation Age of Onset   Colon polyps Mother    Atrial fibrillation Mother    Heart disease Mother    Hypertension Mother     Hyperlipidemia Mother    Anxiety disorder Mother    Obesity Mother    Anemia Mother    Heart disease Father    Hypertension Father    Hyperlipidemia Father    Eczema Father    Diabetes Father    Sleep apnea Father    Crohn's disease Brother    Colon cancer Maternal Uncle 40   Diabetes Maternal Uncle    Stroke Maternal Uncle    Dementia Maternal Uncle    Heart disease Maternal Grandmother    Diabetes Maternal Grandmother    Breast cancer Maternal Grandmother 69   Hypertension Maternal Grandmother    Stroke Maternal Grandmother    Arthritis Maternal Grandmother    Dementia Maternal Grandmother    Heart disease Maternal Grandfather    Lung cancer Maternal Grandfather    Colon cancer Maternal Grandfather 57   Brain cancer Maternal Grandfather    Hypertension Maternal Grandfather    Heart attack Maternal Grandfather    Prostate cancer Maternal Grandfather    Heart disease Paternal Grandmother    Hypertension Paternal Grandmother    Heart disease Paternal Grandfather    Hypertension Paternal Grandfather    Heart attack Paternal Grandfather    Other Daughter        pre-diabetes   Allergic rhinitis Neg Hx    Angioedema Neg Hx    Asthma Neg Hx    Immunodeficiency Neg Hx    Urticaria Neg Hx    Esophageal cancer Neg Hx    Rectal cancer Neg Hx    Stomach cancer Neg Hx     Social History   Socioeconomic History   Marital status: Married    Spouse name: Not on file   Number of children: Not on file   Years of education: Not on file   Highest education level: Bachelor's degree (e.g., BA, AB, BS)  Occupational History   Not on file  Tobacco Use   Smoking status: Never    Passive exposure: Past   Smokeless tobacco: Never   Tobacco comments:    No use  Vaping Use   Vaping status: Never Used  Substance and Sexual Activity   Alcohol use: Yes    Comment: 1-2 drinks a month   Drug use: No   Sexual activity: Yes    Partners: Male    Birth control/protection: Other-see  comments    Comment: Vasectomy, menarche 52yo, sexual debut 52yo  Other Topics Concern   Not on file  Social History Narrative   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   Married   21 year old daughter (2021)  Social Drivers of Corporate investment banker Strain: Low Risk  (09/02/2022)   Overall Financial Resource Strain (CARDIA)    Difficulty of Paying Living Expenses: Not very hard  Food Insecurity: No Food Insecurity (09/02/2022)   Hunger Vital Sign    Worried About Running Out of Food in the Last Year: Never true    Ran Out of Food in the Last Year: Never true  Transportation Needs: No Transportation Needs (09/02/2022)   PRAPARE - Administrator, Civil Service (Medical): No    Lack of Transportation (Non-Medical): No  Physical Activity: Sufficiently Active (09/02/2022)   Exercise Vital Sign    Days of Exercise per Week: 5 days    Minutes of Exercise per Session: 40 min  Stress: No Stress Concern Present (09/02/2022)   [employer] of Occupational Health - Occupational Stress Questionnaire    Feeling of Stress : Only a little  Social Connections: Socially Integrated (09/02/2022)   Social Connection and Isolation Panel [NHANES]    Frequency of Communication with Friends and Family: Twice a week    Frequency of Social Gatherings with Friends and Family: Twice a week    Attends Religious Services: More than 4 times per year    Active Member of [employer] or Organizations: Yes    Attends Engineer, structural: More than 4 times per year    Marital Status: Married  Catering manager Violence: Not on file    Review of Systems:  All other review of systems negative except as mentioned in the HPI.  Physical Exam: Vital signs BP 102/71   Pulse 66   Temp (!) 97.5 F (36.4 C) (Skin)   Ht 5\' 3"  (1.6 m)   Wt 200 lb (90.7 kg)   LMP 04/16/2023   SpO2 97%   BMI 35.43 kg/m   General:   Alert,  Well-developed, well-nourished, pleasant and cooperative in  NAD Airway:  Mallampati 2 Lungs:  Clear throughout to auscultation.   Heart:  Regular rate and rhythm; no murmurs, clicks, rubs,  or gallops. Abdomen:  Soft, nontender and nondistended. Normal bowel sounds.   Neuro/Psych:  Normal mood and affect. A and O x 3   Yailyn Strack E. Tomasa Rand, MD [facility]

## 2023-04-17 ENCOUNTER — Telehealth: Payer: Self-pay

## 2023-04-17 NOTE — Telephone Encounter (Signed)
LMOM

## 2023-04-17 NOTE — Telephone Encounter (Signed)
For final review.

## 2023-04-18 ENCOUNTER — Other Ambulatory Visit: Payer: Self-pay | Admitting: Physician Assistant

## 2023-04-18 ENCOUNTER — Other Ambulatory Visit: Payer: Self-pay

## 2023-04-18 ENCOUNTER — Other Ambulatory Visit (HOSPITAL_COMMUNITY): Payer: Self-pay

## 2023-04-18 DIAGNOSIS — E039 Hypothyroidism, unspecified: Secondary | ICD-10-CM

## 2023-04-18 MED ORDER — SYNTHROID 75 MCG PO TABS
75.0000 ug | ORAL_TABLET | Freq: Every day | ORAL | 0 refills | Status: DC
  Filled 2023-04-18: qty 90, 90d supply, fill #0

## 2023-04-21 ENCOUNTER — Other Ambulatory Visit: Payer: Self-pay

## 2023-06-09 ENCOUNTER — Other Ambulatory Visit: Payer: Self-pay | Admitting: Cardiology

## 2023-06-10 ENCOUNTER — Other Ambulatory Visit (HOSPITAL_COMMUNITY): Payer: Self-pay

## 2023-06-10 ENCOUNTER — Other Ambulatory Visit: Payer: Self-pay

## 2023-06-10 MED ORDER — METOPROLOL TARTRATE 25 MG PO TABS
12.5000 mg | ORAL_TABLET | Freq: Three times a day (TID) | ORAL | 3 refills | Status: DC | PRN
  Filled 2023-06-10: qty 135, 90d supply, fill #0

## 2023-06-30 ENCOUNTER — Other Ambulatory Visit: Payer: Self-pay | Admitting: Physician Assistant

## 2023-07-01 ENCOUNTER — Other Ambulatory Visit (HOSPITAL_COMMUNITY): Payer: Self-pay

## 2023-07-01 MED ORDER — ALPRAZOLAM 0.25 MG PO TABS
0.2500 mg | ORAL_TABLET | Freq: Every evening | ORAL | 0 refills | Status: AC | PRN
  Filled 2023-07-01: qty 20, 20d supply, fill #0

## 2023-07-01 NOTE — Telephone Encounter (Signed)
 Pt requesting refill for Alprazolam 0.25 mg tablet. Last OV 10/10/2022.

## 2023-07-02 ENCOUNTER — Encounter: Payer: Self-pay | Admitting: Physician Assistant

## 2023-07-02 ENCOUNTER — Other Ambulatory Visit: Payer: Self-pay

## 2023-07-25 ENCOUNTER — Ambulatory Visit: Admitting: Physician Assistant

## 2023-07-25 ENCOUNTER — Encounter: Payer: Self-pay | Admitting: Physician Assistant

## 2023-07-25 VITALS — BP 110/70 | HR 55 | Temp 97.3°F | Ht 63.25 in | Wt 210.0 lb

## 2023-07-25 DIAGNOSIS — F419 Anxiety disorder, unspecified: Secondary | ICD-10-CM | POA: Diagnosis not present

## 2023-07-25 DIAGNOSIS — Z0000 Encounter for general adult medical examination without abnormal findings: Secondary | ICD-10-CM

## 2023-07-25 DIAGNOSIS — G8929 Other chronic pain: Secondary | ICD-10-CM | POA: Diagnosis not present

## 2023-07-25 DIAGNOSIS — E669 Obesity, unspecified: Secondary | ICD-10-CM | POA: Diagnosis not present

## 2023-07-25 DIAGNOSIS — E7849 Other hyperlipidemia: Secondary | ICD-10-CM

## 2023-07-25 DIAGNOSIS — E039 Hypothyroidism, unspecified: Secondary | ICD-10-CM | POA: Diagnosis not present

## 2023-07-25 DIAGNOSIS — M546 Pain in thoracic spine: Secondary | ICD-10-CM

## 2023-07-25 LAB — CBC WITH DIFFERENTIAL/PLATELET
Basophils Absolute: 0 10*3/uL (ref 0.0–0.1)
Basophils Relative: 0.4 % (ref 0.0–3.0)
Eosinophils Absolute: 0.2 10*3/uL (ref 0.0–0.7)
Eosinophils Relative: 2.7 % (ref 0.0–5.0)
HCT: 42.7 % (ref 36.0–46.0)
Hemoglobin: 14.2 g/dL (ref 12.0–15.0)
Lymphocytes Relative: 31.5 % (ref 12.0–46.0)
Lymphs Abs: 2.5 10*3/uL (ref 0.7–4.0)
MCHC: 33.3 g/dL (ref 30.0–36.0)
MCV: 94.1 fl (ref 78.0–100.0)
Monocytes Absolute: 0.7 10*3/uL (ref 0.1–1.0)
Monocytes Relative: 8.9 % (ref 3.0–12.0)
Neutro Abs: 4.5 10*3/uL (ref 1.4–7.7)
Neutrophils Relative %: 56.5 % (ref 43.0–77.0)
Platelets: 339 10*3/uL (ref 150.0–400.0)
RBC: 4.54 Mil/uL (ref 3.87–5.11)
RDW: 13 % (ref 11.5–15.5)
WBC: 8 10*3/uL (ref 4.0–10.5)

## 2023-07-25 LAB — COMPREHENSIVE METABOLIC PANEL WITH GFR
ALT: 17 U/L (ref 0–35)
AST: 17 U/L (ref 0–37)
Albumin: 4.2 g/dL (ref 3.5–5.2)
Alkaline Phosphatase: 59 U/L (ref 39–117)
BUN: 11 mg/dL (ref 6–23)
CO2: 27 meq/L (ref 19–32)
Calcium: 9.1 mg/dL (ref 8.4–10.5)
Chloride: 106 meq/L (ref 96–112)
Creatinine, Ser: 0.65 mg/dL (ref 0.40–1.20)
GFR: 102.79 mL/min (ref >60.00)
Glucose, Bld: 82 mg/dL (ref 70–99)
Potassium: 4.1 meq/L (ref 3.5–5.1)
Sodium: 138 meq/L (ref 135–145)
Total Bilirubin: 0.5 mg/dL (ref 0.2–1.2)
Total Protein: 7.2 g/dL (ref 6.0–8.3)

## 2023-07-25 LAB — LIPID PANEL
Cholesterol: 165 mg/dL (ref 0–200)
HDL: 45.2 mg/dL (ref >39.00)
LDL Cholesterol: 104 mg/dL — ABNORMAL HIGH (ref 0–99)
NonHDL: 120.29
Total CHOL/HDL Ratio: 4
Triglycerides: 80 mg/dL (ref 0.0–149.0)
VLDL: 16 mg/dL (ref 0.0–40.0)

## 2023-07-25 LAB — TSH: TSH: 1.94 u[IU]/mL (ref 0.35–5.50)

## 2023-07-25 LAB — HEMOGLOBIN A1C: Hgb A1c MFr Bld: 5.2 % (ref 4.6–6.5)

## 2023-07-25 NOTE — Progress Notes (Signed)
 Subjective:    Julie Yang is a 53 y.o. female and is here for a comprehensive physical exam.  HPI  There are no preventive care reminders to display for this patient.   Acute Concerns: Back Pain  Patient complains of mid back pain but attributes that to either her mattress or pre-menopausal symptoms.   Patient states that she's always expressed interest in undergoing a breast reduction.  Chronic Issues: Anxiety  Reports compliance and good tolerance of Zoloft 25 mg daily and Xanax 0.5 mg PRN.  She has been experiencing increased stress due to her work.   Hypothyroidism  Reports compliance and good tolerance of Synthroid 75 mcg daily.  No concerns or symptoms at this time.  Insulin Resistance  Patient states that she was told to keep an eye on her blood sugars during her visit to health and wellness clinic.  She reports experiencing elevated heart rate whenever she's hungry.  Patient states she didn't see much benefits from following up with health and wellness.  Will check levels today.  Pre-Menopause  Patient reports experiencing pre-menopausal symptoms including irregular cycles. She has also been experiencing mood changes and fatigue.   Health Maintenance: Immunizations -- N/A Colonoscopy -- last done on 04-16-23. Recommended 7 year recall.  Mammogram -- last done on 10-21-22. Normal  PAP -- last done on 03-13-23. Normal  Bone Density -- N/A Diet -- diet currently is unbalanced due to being extremely busy at work.  Exercise -- minimal exercise   Sleep habits -- no complains  Mood -- Stable overall  UTD with dentist? - yes UTD with eye doctor? - yes  Weight history: Wt Readings from Last 10 Encounters:  07/25/23 210 lb (95.3 kg)  04/16/23 200 lb (90.7 kg)  03/13/23 205 lb (93 kg)  03/11/23 200 lb (90.7 kg)  12/25/22 200 lb (90.7 kg)  12/10/22 202 lb (91.6 kg)  11/28/22 205 lb (93 kg)  11/26/22 208 lb (94.3 kg)  10/17/22 211 lb (95.7 kg)  10/10/22  210 lb 3.2 oz (95.3 kg)   Body mass index is 36.91 kg/m. Patient's last menstrual period was 07/18/2023.  Alcohol use:  reports current alcohol use.  Tobacco use:  Tobacco Use: Low Risk  (07/25/2023)   Patient History    Smoking Tobacco Use: Never    Smokeless Tobacco Use: Never    Passive Exposure: Past   Eligible for lung cancer screening? no     07/25/2023   11:48 AM  Depression screen PHQ 2/9  Decreased Interest 0  Down, Depressed, Hopeless 1  PHQ - 2 Score 1  Altered sleeping 0  Tired, decreased energy 1  Change in appetite 1  Feeling bad or failure about yourself  1  Trouble concentrating 0  Moving slowly or fidgety/restless 0  Suicidal thoughts 0  PHQ-9 Score 4  Difficult doing work/chores Not difficult at all     Other providers/specialists: Patient Care Team: Jarold Motto, [place] as PCP - General (Physician Assistant) Thomasene Ripple, DO as PCP - Cardiology (Cardiology) Verner Chol, CNM as Referring Physician (Certified Nurse Midwife) Patton Salles, MD as Consulting Physician (Obstetrics and Gynecology)    PMHx, SurgHx, SocialHx, Medications, and Allergies were reviewed in the Visit Navigator and updated as appropriate.   Past Medical History:  Diagnosis Date   Abnormal Pap smear of cervix    1990s--hx of cryotherapy to cervix   Allergy 2009   All are related to the chronic urticaria   Anxiety  COVID-19 virus infection 10/2019   Depression    Genital warts    GERD (gastroesophageal reflux disease)    Hypertension    Hypothyroidism    Joint pain    PSVT (paroxysmal supraventricular tachycardia) (HCC)    Thyroid disease 2013   Urticaria    chronic autoimmune urticaria   Vitamin D deficiency      Past Surgical History:  Procedure Laterality Date   CESAREAN SECTION     COLONOSCOPY     CYST EXCISION     from chest, benign     Family History  Problem Relation Age of Onset   Colon polyps Mother    Atrial fibrillation  Mother    Heart disease Mother    Hypertension Mother    Hyperlipidemia Mother    Anxiety disorder Mother    Obesity Mother    Anemia Mother    Kidney Stones Mother    Other Mother        esophageal polyps   Heart disease Father    Hypertension Father    Hyperlipidemia Father    Eczema Father    Diabetes Father    Sleep apnea Father    Crohn's disease Brother    Heart disease Maternal Grandmother    Diabetes Maternal Grandmother    Breast cancer Maternal Grandmother 7   Hypertension Maternal Grandmother    Stroke Maternal Grandmother    Arthritis Maternal Grandmother    Dementia Maternal Grandmother    Heart disease Maternal Grandfather    Lung cancer Maternal Grandfather    Colon cancer Maternal Grandfather 55   Brain cancer Maternal Grandfather    Hypertension Maternal Grandfather    Heart attack Maternal Grandfather    Prostate cancer Maternal Grandfather    Heart disease Paternal Grandmother    Hypertension Paternal Grandmother    Heart disease Paternal Grandfather    Hypertension Paternal Grandfather    Heart attack Paternal Grandfather    Other Daughter        pre-diabetes   Colon cancer Maternal Uncle 40   Diabetes Maternal Uncle    Stroke Maternal Uncle    Dementia Maternal Uncle    Allergic rhinitis Neg Hx    Angioedema Neg Hx    Asthma Neg Hx    Immunodeficiency Neg Hx    Urticaria Neg Hx    Esophageal cancer Neg Hx    Rectal cancer Neg Hx    Stomach cancer Neg Hx     Social History   Tobacco Use   Smoking status: Never    Passive exposure: Past   Smokeless tobacco: Never   Tobacco comments:    No use  Vaping Use   Vaping status: Never Used  Substance Use Topics   Alcohol use: Yes    Comment: 1-2 drinks a month   Drug use: No    Review of Systems:   Review of Systems  Constitutional:  Negative for chills, fever, malaise/fatigue and weight loss.  HENT:  Negative for hearing loss, sinus pain and sore throat.   Respiratory:  Negative  for cough and hemoptysis.   Cardiovascular:  Negative for chest pain, palpitations, leg swelling and PND.  Gastrointestinal:  Negative for abdominal pain, constipation, diarrhea, heartburn, nausea and vomiting.  Genitourinary:  Negative for dysuria, frequency and urgency.  Musculoskeletal:  Positive for back pain. Negative for myalgias and neck pain.  Skin:  Negative for itching and rash.  Neurological:  Negative for dizziness, tingling, seizures and headaches.  Endo/Heme/Allergies:  Negative for polydipsia.  Psychiatric/Behavioral:  Negative for depression. The patient is nervous/anxious.     Objective:   BP 110/70 (BP Location: Left Arm, Patient Position: Sitting, Cuff Size: Large)   Pulse (!) 55   Temp (!) 97.3 F (36.3 C) (Temporal)   Ht 5' 3.25" (1.607 m)   Wt 210 lb (95.3 kg)   LMP 07/18/2023   SpO2 98%   BMI 36.91 kg/m  Body mass index is 36.91 kg/m.   General Appearance:    Alert, cooperative, no distress, appears stated age  Head:    Normocephalic, without obvious abnormality, atraumatic  Eyes:    PERRL, conjunctiva/corneas clear, EOM's intact, fundi    benign, both eyes  Ears:    Normal TM's and external ear canals, both ears  Nose:   Nares normal, septum midline, mucosa normal, no drainage    or sinus tenderness  Throat:   Lips, mucosa, and tongue normal; teeth and gums normal  Neck:   Supple, symmetrical, trachea midline, no adenopathy;    thyroid:  no enlargement/tenderness/nodules; no carotid   bruit or JVD  Back:     Symmetric, no curvature, ROM normal, no CVA tenderness  Lungs:     Clear to auscultation bilaterally, respirations unlabored  Chest Wall:    No tenderness or deformity   Heart:    Regular rate and rhythm, S1 and S2 normal, no murmur, rub or gallop  Breast Exam:    Deferred  Abdomen:     Soft, non-tender, bowel sounds active all four quadrants,    no masses, no organomegaly  Genitalia:    Deferred  Extremities:   Extremities normal, atraumatic,  no cyanosis or edema  Pulses:   2+ and symmetric all extremities  Skin:   Skin color, texture, turgor normal, no rashes or lesions  Lymph nodes:   Cervical, supraclavicular, and axillary nodes normal  Neurologic:   CNII-XII intact, normal strength, sensation and reflexes    throughout    Assessment/Plan:   Routine physical examination Today patient counseled on age appropriate routine health concerns for screening and prevention, each reviewed and up to date or declined. Immunizations reviewed and up to date or declined. Labs ordered and reviewed. Risk factors for depression reviewed and negative. Hearing function and visual acuity are intact. ADLs screened and addressed as needed. Functional ability and level of safety reviewed and appropriate. Education, counseling and referrals performed based on assessed risks today. Patient provided with a copy of personalized plan for preventive services.  Obesity, unspecified class, unspecified obesity type, unspecified whether serious comorbidity present Continue efforts at healthy lifestyle  Vitamin D deficiency Update vitamin D and provide recommendations   Other hyperlipidemia Update lipid panel and provide recommendations   Hypothyroidism, unspecified type Update TSH and adjust synthroid 75 mcg daily as indicated  Anxiety Well controlled Continue sertraline 25 mg daily Follow up in 1 year(s), sooner if needed  Chronic bilateral thoracic back pain No red flag symptom(s) Declines any intervention or work up at this time She has contemplated breast reduction in the past Continue efforts weight loss and good ergonomics at work desk  Jarold Motto, PA-C [facility]  I,Safa M Engineer, manufacturing systems as a Neurosurgeon for [employer], PA.,have documented all relevant documentation on the behalf of Jarold Motto, PA,as directed by  Jarold Motto, PA while in the presence of Jarold Motto, [place].   I, Jarold Motto, [place], have  reviewed all documentation for this visit. The documentation on 07/25/23 for the  exam, diagnosis, procedures, and orders are all accurate and complete.

## 2023-07-25 NOTE — Patient Instructions (Signed)
 It was great to see you!  Keep me posted on your back pain!  Please go to the lab for blood work.   Our office will call you with your results unless you have chosen to receive results via MyChart.  If your blood work is normal we will follow-up each year for physicals and as scheduled for chronic medical problems.  If anything is abnormal we will treat accordingly and get you in for a follow-up.  Take care,  Lelon Mast

## 2023-07-28 ENCOUNTER — Encounter: Payer: Self-pay | Admitting: Physician Assistant

## 2023-07-30 ENCOUNTER — Other Ambulatory Visit: Payer: Self-pay

## 2023-07-30 ENCOUNTER — Other Ambulatory Visit: Payer: Self-pay | Admitting: Physician Assistant

## 2023-07-30 ENCOUNTER — Other Ambulatory Visit (HOSPITAL_COMMUNITY): Payer: Self-pay

## 2023-07-30 DIAGNOSIS — E039 Hypothyroidism, unspecified: Secondary | ICD-10-CM

## 2023-07-30 MED ORDER — SYNTHROID 75 MCG PO TABS
75.0000 ug | ORAL_TABLET | Freq: Every day | ORAL | 1 refills | Status: DC
Start: 2023-07-30 — End: 2023-11-19
  Filled 2023-07-30: qty 90, 90d supply, fill #0
  Filled 2023-10-24: qty 90, 90d supply, fill #1

## 2023-09-16 ENCOUNTER — Other Ambulatory Visit: Payer: Self-pay | Admitting: Physician Assistant

## 2023-09-16 DIAGNOSIS — Z1231 Encounter for screening mammogram for malignant neoplasm of breast: Secondary | ICD-10-CM

## 2023-09-18 ENCOUNTER — Other Ambulatory Visit (HOSPITAL_COMMUNITY): Payer: Self-pay

## 2023-09-18 ENCOUNTER — Other Ambulatory Visit: Payer: Self-pay

## 2023-09-18 MED ORDER — METOPROLOL TARTRATE 25 MG PO TABS
12.5000 mg | ORAL_TABLET | Freq: Two times a day (BID) | ORAL | 3 refills | Status: AC
  Filled 2023-09-18: qty 90, 90d supply, fill #0
  Filled 2024-01-12: qty 90, 90d supply, fill #1
  Filled 2024-04-07: qty 90, 90d supply, fill #2

## 2023-09-18 NOTE — Progress Notes (Signed)
 Spoke with Dr. Emmette Harms and pt. Pt taking Metoprolol  tartrate 12.5 mg once in the morning and 12.5 mg at night. Prescription updated and sent to preferred pharmacy.

## 2023-09-25 ENCOUNTER — Ambulatory Visit: Admitting: Physician Assistant

## 2023-09-25 ENCOUNTER — Encounter: Payer: Self-pay | Admitting: Physician Assistant

## 2023-09-25 ENCOUNTER — Other Ambulatory Visit (HOSPITAL_COMMUNITY)
Admission: RE | Admit: 2023-09-25 | Discharge: 2023-09-25 | Disposition: A | Discharge status: Home / Self Care | Source: Ambulatory Visit | Attending: Physician Assistant | Admitting: Physician Assistant

## 2023-09-25 VITALS — BP 120/78 | HR 58 | Temp 97.5°F | Ht 63.25 in | Wt 215.2 lb

## 2023-09-25 DIAGNOSIS — N898 Other specified noninflammatory disorders of vagina: Secondary | ICD-10-CM | POA: Insufficient documentation

## 2023-09-25 DIAGNOSIS — R319 Hematuria, unspecified: Secondary | ICD-10-CM

## 2023-09-25 LAB — POCT URINALYSIS DIPSTICK
Bilirubin, UA: NEGATIVE
Blood, UA: NEGATIVE
Glucose, UA: NEGATIVE
Ketones, UA: NEGATIVE
Leukocytes, UA: NEGATIVE
Nitrite, UA: NEGATIVE
Protein, UA: NEGATIVE
Spec Grav, UA: 1.01 (ref 1.010–1.025)
Urobilinogen, UA: 0.2 U/dL
pH, UA: 6 (ref 5.0–8.0)

## 2023-09-25 LAB — POCT URINE PREGNANCY: Preg Test, Ur: NEGATIVE

## 2023-09-25 NOTE — Progress Notes (Signed)
 Julie Yang is a 53 y.o. female here for a new problem.  History of Present Illness:   Chief Complaint  Patient presents with   Urinary symptoms    Pt has been having low back pain, constant dull ache, started around 5/7. Also having pink vaginal discharge since period ended.   Urinary symptoms: Pt complains of lower back pain, pink vaginal discharge, and fatigue, starting 5/7.  Has had the pink discharge since her LMP.  Endorses fatigue, which she believes may be attributed to recent increased stress from work.  Has not managed her pain with any medications and tends to avoid taking Ibuprofen.  Her menstrual cycles are now more unpredictable; every 4-6 weeks and sometimes longer.  Has a hx of yeast infections, but states her current symptoms are not similar.  She notes she is drinking more sodas and not enough water.  She is also going to the gym more frequently.  No recent travel or holding her bladder for excessive periods of time.  Denies any fever, chills, pelvic pain, or recent stool changes.    Past Medical History:  Diagnosis Date   Abnormal Pap smear of cervix    1990s--hx of cryotherapy to cervix   Allergy 2009   All are related to the chronic urticaria   Anxiety    COVID-19 virus infection 10/2019   Depression    Genital warts    GERD (gastroesophageal reflux disease)    Hypertension    Hypothyroidism    Joint pain    PSVT (paroxysmal supraventricular tachycardia) (HCC)    Thyroid  disease 2013   Urticaria    chronic autoimmune urticaria   Vitamin D  deficiency      Social History   Tobacco Use   Smoking status: Never    Passive exposure: Past   Smokeless tobacco: Never   Tobacco comments:    No use  Vaping Use   Vaping status: Never Used  Substance Use Topics   Alcohol use: Yes    Comment: 1-2 drinks a month   Drug use: No    Past Surgical History:  Procedure Laterality Date   CESAREAN SECTION     COLONOSCOPY     CYST EXCISION     from  chest, benign    Family History  Problem Relation Age of Onset   Colon polyps Mother    Atrial fibrillation Mother    Heart disease Mother    Hypertension Mother    Hyperlipidemia Mother    Anxiety disorder Mother    Obesity Mother    Anemia Mother    Kidney Stones Mother    Other Mother        esophageal polyps   Heart disease Father    Hypertension Father    Hyperlipidemia Father    Eczema Father    Diabetes Father    Sleep apnea Father    Crohn's disease Brother    Heart disease Maternal Grandmother    Diabetes Maternal Grandmother    Breast cancer Maternal Grandmother 8   Hypertension Maternal Grandmother    Stroke Maternal Grandmother    Arthritis Maternal Grandmother    Dementia Maternal Grandmother    Heart disease Maternal Grandfather    Lung cancer Maternal Grandfather    Colon cancer Maternal Grandfather 13   Brain cancer Maternal Grandfather    Hypertension Maternal Grandfather    Heart attack Maternal Grandfather    Prostate cancer Maternal Grandfather    Heart disease Paternal Grandmother  Hypertension Paternal Grandmother    Heart disease Paternal Grandfather    Hypertension Paternal Grandfather    Heart attack Paternal Grandfather    Other Daughter        pre-diabetes   Colon cancer Maternal Uncle 40   Diabetes Maternal Uncle    Stroke Maternal Uncle    Dementia Maternal Uncle    Allergic rhinitis Neg Hx    Angioedema Neg Hx    Asthma Neg Hx    Immunodeficiency Neg Hx    Urticaria Neg Hx    Esophageal cancer Neg Hx    Rectal cancer Neg Hx    Stomach cancer Neg Hx     Allergies  Allergen Reactions   Cortisone Hives   Prednisone  Hives   Aspirin Hives    Exacerbates hives   Ibuprofen Hives    Exacerbates hives   Iodine Hives   Povidone Iodine Rash    Current Medications:   Current Outpatient Medications:    ALPRAZolam  (XANAX ) 0.25 MG tablet, Take 1 tablet (0.25 mg total) by mouth at bedtime as needed for anxiety., Disp: 20  tablet, Rfl: 0   Cholecalciferol (VITAMIN D ) 50 MCG (2000 UT) CAPS, Take 2,000 Units by mouth daily., Disp: , Rfl:    MAGNESIUM PO, Take 2,000 mg by mouth 2 (two) times daily. Neuro Mag, Disp: , Rfl:    metoprolol  tartrate (LOPRESSOR ) 25 MG tablet, Take 0.5 tablets (12.5 mg total) by mouth 2 (two) times daily., Disp: 90 tablet, Rfl: 3   PROBIOTIC PRODUCT PO, Take by mouth., Disp: , Rfl:    sertraline  (ZOLOFT ) 25 MG tablet, Take 1 tablet (25 mg total) by mouth daily., Disp: 90 tablet, Rfl: 3   SYNTHROID  75 MCG tablet, Take 1 tablet (75 mcg total) by mouth daily before breakfast., Disp: 90 tablet, Rfl: 1   triamcinolone  ointment (KENALOG ) 0.5 %, Apply 1 Application topically to affected area 1-2 times daily., Disp: 30 g, Rfl: 2  Current Facility-Administered Medications:    0.9 %  sodium chloride  infusion, 500 mL, Intravenous, Once, Elois Hair, MD   Review of Systems:   Negative unless otherwise specified per HPI.  Vitals:   Vitals:   09/25/23 0857  BP: 120/78  Pulse: (!) 58  Temp: (!) 97.5 F (36.4 C)  TempSrc: Temporal  SpO2: 96%  Weight: 215 lb 4 oz (97.6 kg)  Height: 5' 3.25" (1.607 m)     Body mass index is 37.83 kg/m.  Physical Exam:   Physical Exam Vitals and nursing note reviewed. Exam conducted with a chaperone present.  Constitutional:      General: She is not in acute distress.    Appearance: She is well-developed. She is not ill-appearing or toxic-appearing.  Cardiovascular:     Rate and Rhythm: Normal rate and regular rhythm.     Pulses: Normal pulses.     Heart sounds: Normal heart sounds, S1 normal and S2 normal.  Pulmonary:     Effort: Pulmonary effort is normal.     Breath sounds: Normal breath sounds.  Abdominal:     Tenderness: There is no right CVA tenderness or left CVA tenderness.  Genitourinary:    Vagina: Normal.     Cervix: Normal.     Uterus: Normal.      Adnexa: Right adnexa normal and left adnexa normal.  Skin:    General: Skin  is warm and dry.  Neurological:     Mental Status: She is alert.     GCS: GCS eye  subscore is 4. GCS verbal subscore is 5. GCS motor subscore is 6.  Psychiatric:        Speech: Speech normal.        Behavior: Behavior normal. Behavior is cooperative.    Results for orders placed or performed in visit on 09/25/23  POCT urinalysis dipstick  Result Value Ref Range   Color, UA Yellow    Clarity, UA Clear    Glucose, UA Negative Negative   Bilirubin, UA Negative    Ketones, UA Negative    Spec Grav, UA 1.010 1.010 - 1.025   Blood, UA Negative    pH, UA 6.0 5.0 - 8.0   Protein, UA Negative Negative   Urobilinogen, UA 0.2 0.2 or 1.0 E.U./dL   Nitrite, UA Negative    Leukocytes, UA Negative Negative   Appearance     Odor    POCT urine pregnancy  Result Value Ref Range   Preg Test, Ur Negative Negative     Assessment and Plan:   1. Hematuria, unspecified type (Primary) No red flags Resolved per her report Urine without blood on initial Point of Care (POC) test Will send for culture Continue efforts at adequate hydration She was instructed to reach out if she has any new symptom(s)  - POCT urinalysis dipstick - Urine Culture - POCT urine pregnancy  2. Vaginal discharge Vaginal swab obtained If unremarkable results and persistent symptom(s), will refer to her gynecologist for further evaluation No tenderness on bimanual exam - Cervicovaginal ancillary only - POCT urine pregnancy    I, Bernita Bristle, acting as a Neurosurgeon for Alexander Iba, [place]., have documented all relevant documentation on the behalf of Alexander Iba, [place], as directed by   while in the presence of Alexander Iba, [place].  I, Alexander Iba, [place], have reviewed all documentation for this visit. The documentation on 09/25/23 for the exam, diagnosis, procedures, and orders are all accurate and complete.  Alexander Iba, PA-C

## 2023-09-26 ENCOUNTER — Ambulatory Visit: Payer: Self-pay | Admitting: Physician Assistant

## 2023-09-26 LAB — CERVICOVAGINAL ANCILLARY ONLY
Bacterial Vaginitis (gardnerella): NEGATIVE
Candida Glabrata: NEGATIVE
Candida Vaginitis: NEGATIVE
Comment: NEGATIVE
Comment: NEGATIVE
Comment: NEGATIVE

## 2023-09-27 LAB — URINE CULTURE
MICRO NUMBER:: 16514397
SPECIMEN QUALITY:: ADEQUATE

## 2023-09-29 ENCOUNTER — Other Ambulatory Visit (HOSPITAL_COMMUNITY): Payer: Self-pay

## 2023-09-29 ENCOUNTER — Encounter: Payer: Self-pay | Admitting: Pharmacist

## 2023-09-29 ENCOUNTER — Other Ambulatory Visit: Payer: Self-pay

## 2023-09-29 ENCOUNTER — Other Ambulatory Visit: Payer: Self-pay | Admitting: Physician Assistant

## 2023-09-29 MED ORDER — CEPHALEXIN 500 MG PO CAPS
500.0000 mg | ORAL_CAPSULE | Freq: Two times a day (BID) | ORAL | 0 refills | Status: DC
Start: 2023-09-29 — End: 2023-10-29
  Filled 2023-09-29 (×2): qty 14, 7d supply, fill #0

## 2023-10-21 ENCOUNTER — Other Ambulatory Visit: Payer: Self-pay | Admitting: Radiology

## 2023-10-21 DIAGNOSIS — N926 Irregular menstruation, unspecified: Secondary | ICD-10-CM

## 2023-10-21 NOTE — Telephone Encounter (Signed)
 Please have her come in for ultrasound and visit with me on 7/3

## 2023-10-23 ENCOUNTER — Ambulatory Visit
Admission: RE | Admit: 2023-10-23 | Discharge: 2023-10-23 | Disposition: A | Discharge status: Home / Self Care | Source: Ambulatory Visit | Attending: Physician Assistant | Admitting: Physician Assistant

## 2023-10-23 DIAGNOSIS — Z1231 Encounter for screening mammogram for malignant neoplasm of breast: Secondary | ICD-10-CM | POA: Diagnosis not present

## 2023-10-24 ENCOUNTER — Other Ambulatory Visit: Payer: Self-pay

## 2023-10-24 ENCOUNTER — Other Ambulatory Visit (HOSPITAL_COMMUNITY): Payer: Self-pay

## 2023-10-29 ENCOUNTER — Encounter: Payer: Self-pay | Admitting: Radiology

## 2023-10-29 ENCOUNTER — Other Ambulatory Visit (HOSPITAL_COMMUNITY): Payer: Self-pay

## 2023-10-29 ENCOUNTER — Ambulatory Visit (INDEPENDENT_AMBULATORY_CARE_PROVIDER_SITE_OTHER): Admitting: Radiology

## 2023-10-29 ENCOUNTER — Ambulatory Visit (INDEPENDENT_AMBULATORY_CARE_PROVIDER_SITE_OTHER)

## 2023-10-29 ENCOUNTER — Other Ambulatory Visit: Payer: Self-pay

## 2023-10-29 VITALS — BP 122/64 | HR 55 | Ht 63.5 in | Wt 212.0 lb

## 2023-10-29 DIAGNOSIS — N926 Irregular menstruation, unspecified: Secondary | ICD-10-CM | POA: Diagnosis not present

## 2023-10-29 DIAGNOSIS — R9389 Abnormal findings on diagnostic imaging of other specified body structures: Secondary | ICD-10-CM

## 2023-10-29 LAB — IRON,TIBC AND FERRITIN PANEL
%SAT: 40 % (ref 16–45)
Ferritin: 21 ng/mL (ref 16–232)
Iron: 143 ug/dL (ref 45–160)
TIBC: 360 ug/dL (ref 250–450)

## 2023-10-29 LAB — CBC
HCT: 43.7 % (ref 35.0–45.0)
Hemoglobin: 14.3 g/dL (ref 11.7–15.5)
MCH: 31 pg (ref 27.0–33.0)
MCHC: 32.7 g/dL (ref 32.0–36.0)
MCV: 94.6 fL (ref 80.0–100.0)
MPV: 9.6 fL (ref 7.5–12.5)
Platelets: 384 10*3/uL (ref 140–400)
RBC: 4.62 10*6/uL (ref 3.80–5.10)
RDW: 12.5 % (ref 11.0–15.0)
WBC: 8.2 10*3/uL (ref 3.8–10.8)

## 2023-10-29 LAB — FOLLICLE STIMULATING HORMONE: FSH: 6.8 m[IU]/mL

## 2023-10-29 MED ORDER — MISOPROSTOL 200 MCG PO TABS
400.0000 ug | ORAL_TABLET | Freq: Once | ORAL | 0 refills | Status: DC
  Filled 2023-10-29: qty 1, fill #0

## 2023-10-29 NOTE — Progress Notes (Signed)
      Subjective: Julie Yang is a 53 y.o. female who complains of irregular/ heavy bleeding x 4 months. Here for u/s. 12 days of bleeding in March, no bleeding in April, 22 days of bleeding in May, 27 days of total bleeding in June now with light bleeding and small clots. Lower back pain, worse with movement.    Review of Systems  All other systems reviewed and are negative.   Past Medical History:  Diagnosis Date   Abnormal Pap smear of cervix    1990s--hx of cryotherapy to cervix   Allergy 2009   All are related to the chronic urticaria   Anxiety    COVID-19 virus infection 10/2019   Depression    Genital warts    GERD (gastroesophageal reflux disease)    Hypertension    Hypothyroidism    Joint pain    PSVT (paroxysmal supraventricular tachycardia) (HCC)    Thyroid  disease 2013   Urticaria    chronic autoimmune urticaria   Vitamin D  deficiency       Objective:  Today's Vitals   10/29/23 1104  BP: 122/64  Pulse: (!) 55  SpO2: 98%  Weight: 212 lb (96.2 kg)  Height: 5' 3.5 (1.613 m)   Body mass index is 36.97 kg/m.    Narrative & Impression Indication:Irregular heavy bleeding   Vaginal ultrasound   Anteverted/anteflexed, normal size and shape 9.13 x 5.67 x 4.49 cm No myometrial masses     Thickened symmetrical endometrium 13.36 mm No masses seen   Left ovary normal size with normal follicle pattern and perfusion Right ovary 45x29mm simple avascular cyst   No adnexal masses   No free fluid   Impression:Thickened endometrium        Exam Ended: 10/29/23 10:59 Last Resulted: 10/29/23 11:39    Physical Exam Constitutional:      Appearance: Normal appearance.  Pulmonary:     Effort: Pulmonary effort is normal.  Neurological:     Mental Status: She is alert.  Psychiatric:        Mood and Affect: Mood normal.        Thought Content: Thought content normal.        Judgment: Judgment normal.     Assessment:/Plan:   1. Irregular  bleeding (Primary) - CBC - Iron, TIBC and Ferritin Panel - FSH  2. Thickened endometrium Discussed biopsy with patient. Will take Motrin 2 hours before appt Cytotec sent to use the night before (hx of c-section) - Endometrial biopsy; Future    Return for EMB with JC.   Hila Bolding B, NP 11:39 AM

## 2023-10-30 ENCOUNTER — Other Ambulatory Visit (HOSPITAL_COMMUNITY): Payer: Self-pay

## 2023-10-30 ENCOUNTER — Other Ambulatory Visit: Payer: Self-pay

## 2023-10-30 ENCOUNTER — Ambulatory Visit: Payer: Self-pay | Admitting: Radiology

## 2023-10-30 ENCOUNTER — Other Ambulatory Visit: Payer: Self-pay | Admitting: Radiology

## 2023-10-30 DIAGNOSIS — R9389 Abnormal findings on diagnostic imaging of other specified body structures: Secondary | ICD-10-CM

## 2023-10-30 MED ORDER — MISOPROSTOL 200 MCG PO TABS
400.0000 ug | ORAL_TABLET | Freq: Once | ORAL | 0 refills | Status: DC
  Filled 2023-10-30: qty 2, 1d supply, fill #0

## 2023-11-03 ENCOUNTER — Other Ambulatory Visit (HOSPITAL_COMMUNITY): Payer: Self-pay

## 2023-11-10 ENCOUNTER — Ambulatory Visit: Admitting: Podiatry

## 2023-11-10 ENCOUNTER — Ambulatory Visit (INDEPENDENT_AMBULATORY_CARE_PROVIDER_SITE_OTHER)

## 2023-11-10 VITALS — Ht 63.5 in | Wt 212.0 lb

## 2023-11-10 DIAGNOSIS — M7752 Other enthesopathy of left foot: Secondary | ICD-10-CM | POA: Diagnosis not present

## 2023-11-10 DIAGNOSIS — M7662 Achilles tendinitis, left leg: Secondary | ICD-10-CM | POA: Diagnosis not present

## 2023-11-10 DIAGNOSIS — M7672 Peroneal tendinitis, left leg: Secondary | ICD-10-CM

## 2023-11-10 NOTE — Patient Instructions (Addendum)
 I have ordered a MRI of your left ankle for [facility]. If you do not hear for them about scheduling within the next 1 week, or you have any questions please give us  a call at [phone].

## 2023-11-10 NOTE — Progress Notes (Unsigned)
 Subjective:   Patient ID: Julie Yang, female   DOB: 53 y.o.   MRN: [id number]   HPI Chief Complaint  Patient presents with   Foot Pain    RM 13 Patient here for left ankle pain. Patient has tried physical therapy with no relief.   53 year old female presents the office today with the above concerns.  She states she has continued pain to her left ankle which been ongoing now for the last couple of years.  She states that she was at the beach and it aggravated.  She said that side-to-side movements do aggravate her ankle.  She does get some swelling.  No injuries.  She has previous attempted shoe modifications and inserts, physical therapy without resolution as well as topical Voltaren .    Review of Systems  All other systems reviewed and are negative.  Past Medical History:  Diagnosis Date   Abnormal Pap smear of cervix    1990s--hx of cryotherapy to cervix   Allergy 2009   All are related to the chronic urticaria   Anxiety    COVID-19 virus infection 10/2019   Depression    Genital warts    GERD (gastroesophageal reflux disease)    Hypertension    Hypothyroidism    Joint pain    PSVT (paroxysmal supraventricular tachycardia) (HCC)    Thyroid  disease 2013   Urticaria    chronic autoimmune urticaria   Vitamin D  deficiency     Past Surgical History:  Procedure Laterality Date   CESAREAN SECTION     COLONOSCOPY     CYST EXCISION     from chest, benign     Current Outpatient Medications:    ALPRAZolam  (XANAX ) 0.25 MG tablet, Take 1 tablet (0.25 mg total) by mouth at bedtime as needed for anxiety., Disp: 20 tablet, Rfl: 0   Cholecalciferol (VITAMIN D ) 50 MCG (2000 UT) CAPS, Take 2,000 Units by mouth daily., Disp: , Rfl:    MAGNESIUM PO, Take 2,000 mg by mouth 2 (two) times daily. Neuro Mag, Disp: , Rfl:    metoprolol  tartrate (LOPRESSOR ) 25 MG tablet, Take 0.5 tablets (12.5 mg total) by mouth 2 (two) times daily., Disp: 90 tablet, Rfl: 3   misoprostol  (CYTOTEC ) 200  MCG tablet, Place 2 tablets (400 mcg total) vaginally once for 1 dose. The night before procedure, Disp: 2 tablet, Rfl: 0   PROBIOTIC PRODUCT PO, Take by mouth., Disp: , Rfl:    sertraline  (ZOLOFT ) 25 MG tablet, Take 1 tablet (25 mg total) by mouth daily., Disp: 90 tablet, Rfl: 3   SYNTHROID  75 MCG tablet, Take 1 tablet (75 mcg total) by mouth daily before breakfast., Disp: 90 tablet, Rfl: 1   triamcinolone  ointment (KENALOG ) 0.5 %, Apply 1 Application topically to affected area 1-2 times daily., Disp: 30 g, Rfl: 2  Current Facility-Administered Medications:    0.9 %  sodium chloride  infusion, 500 mL, Intravenous, Once, Julie Glendia BRAVO, MD  Allergies  Allergen Reactions   Cortisone Hives   Prednisone  Hives   Aspirin Hives    Exacerbates hives   Ibuprofen Hives    Exacerbates hives   Iodine Hives   Povidone Iodine Rash          Objective:  Physical Exam  General: AAO x3, NAD  Dermatological: Skin is warm, dry and supple bilateral.  There are no open sores, no preulcerative lesions, no rash or signs of infection present.  Vascular: Dorsalis Pedis artery and Posterior Tibial artery pedal pulses are 2/4 bilateral  with immedate capillary fill time. There is no pain with calf compression, swelling, warmth, erythema.   Neruologic: Grossly intact via light touch bilateral.   Musculoskeletal: Today majority of tenderness is localized along the course of the peroneal tendon posterior to the lateral malleolus.  There is some just trace edema present but there is no erythema or warmth.  Clinically the tendons appear to be intact.  Some slight discomfort in the Achilles tendon.  No pain on the medial aspect ankle on the flexor tendons.  No pain to the foot.  MMT 5/5.  Gait: Unassisted, Nonantalgic.       Assessment:   Chronic peroneal tendinitis     Plan:  -Treatment options discussed including all alternatives, risks, and complications -Etiology of symptoms were  discussed -X-rays were obtained and reviewed with the patient.  3 views of the left ankle were obtained.  Joint spaces maintained.  There is no evidence of acute fracture noted. -She is attempted numerous conservative treatments including shoe modifications, inserts, physical therapy, anti-inflammatories without significant improvement.  Due to this have ordered MRI to further evaluate particularly on the Achilles, peroneal tendon to rule out any partial tearing. -Continue ankle brace on uneven surfaces for now as well as supportive shoegear.   Return for MRI results .  Julie Yang DPM

## 2023-11-11 ENCOUNTER — Encounter: Payer: Self-pay | Admitting: Podiatry

## 2023-11-19 ENCOUNTER — Other Ambulatory Visit (HOSPITAL_COMMUNITY): Payer: Self-pay

## 2023-11-19 ENCOUNTER — Other Ambulatory Visit: Payer: Self-pay | Admitting: Physician Assistant

## 2023-11-19 DIAGNOSIS — E039 Hypothyroidism, unspecified: Secondary | ICD-10-CM

## 2023-11-19 MED ORDER — SYNTHROID 75 MCG PO TABS
75.0000 ug | ORAL_TABLET | Freq: Every day | ORAL | 1 refills | Status: AC
  Filled 2023-11-19 – 2024-02-05 (×2): qty 90, 90d supply, fill #0
  Filled 2024-05-14: qty 90, 90d supply, fill #1

## 2023-11-20 ENCOUNTER — Ambulatory Visit: Admitting: Radiology

## 2023-11-20 ENCOUNTER — Encounter: Payer: Self-pay | Admitting: Radiology

## 2023-11-20 ENCOUNTER — Other Ambulatory Visit (HOSPITAL_COMMUNITY)
Admission: RE | Admit: 2023-11-20 | Discharge: 2023-11-20 | Disposition: A | Discharge status: Home / Self Care | Source: Ambulatory Visit | Attending: Radiology | Admitting: Radiology

## 2023-11-20 VITALS — BP 122/74 | HR 97 | Wt 213.6 lb

## 2023-11-20 DIAGNOSIS — N926 Irregular menstruation, unspecified: Secondary | ICD-10-CM | POA: Diagnosis not present

## 2023-11-20 DIAGNOSIS — Z01812 Encounter for preprocedural laboratory examination: Secondary | ICD-10-CM

## 2023-11-20 DIAGNOSIS — R9389 Abnormal findings on diagnostic imaging of other specified body structures: Secondary | ICD-10-CM | POA: Diagnosis not present

## 2023-11-20 LAB — PREGNANCY, URINE: Preg Test, Ur: NEGATIVE

## 2023-11-20 NOTE — Progress Notes (Signed)
      ENDOMETRIAL BIOPSY       Julie Yang 53 y.o. presents for endometrial biopsy. Reason for biopsy:thickened endometrium.  Narrative & Impression Indication:Irregular heavy bleeding   Vaginal ultrasound   Anteverted/anteflexed, normal size and shape 9.13 x 5.67 x 4.49 cm No myometrial masses     Thickened symmetrical endometrium 13.36 mm No masses seen   Left ovary normal size with normal follicle pattern and perfusion Right ovary 45x39mm simple avascular cyst   No adnexal masses   No free fluid   Impression:Thickened endometrium     The indications for endometrial biopsy were reviewed.    Risks of the biopsy including cramping, bleeding, infection, uterine perforation, inadequate specimen and need for additional procedures  were discussed.  The patient states she understands and agrees to undergo procedure today. Consent obtained.  Time out was performed.   Procedure Speculum inserted into the vagina, cervix visualized and was prepped with Hibiclens. A single-toothed tenaculum was placed on the anterior lip of the cervix to stabilize it.  The 3 mm pipelle was introduced into the endometrial cavity without difficulty to a depth of 9cm, suction initiated and a moderate amount of tissue was obtained and sent to pathology.  The instruments were removed from the patient's vagina.  Minimal bleeding from the cervix was noted.  The patient tolerated the procedure well.   Julie Yang, CMA present for exam  Assessment/Plan: 1. Thickened endometrium (Primary) - Surgical pathology( [facility])  2. Irregular bleeding - Surgical pathology( [place]/ [facility])  3. Encounter for preprocedural laboratory examination - Pregnancy, urine; negative    Routine post-procedure instructions were given to the patient.   Will contact with results of biopsy.    Julie Yang, WHNP

## 2023-11-24 ENCOUNTER — Ambulatory Visit: Payer: Self-pay | Admitting: Radiology

## 2023-11-24 ENCOUNTER — Ambulatory Visit
Admission: RE | Admit: 2023-11-24 | Discharge: 2023-11-24 | Disposition: A | Discharge status: Home / Self Care | Source: Ambulatory Visit | Attending: Podiatry | Admitting: Podiatry

## 2023-11-24 ENCOUNTER — Ambulatory Visit: Payer: Self-pay | Admitting: Podiatry

## 2023-11-24 DIAGNOSIS — M25572 Pain in left ankle and joints of left foot: Secondary | ICD-10-CM | POA: Diagnosis not present

## 2023-11-24 DIAGNOSIS — M7672 Peroneal tendinitis, left leg: Secondary | ICD-10-CM

## 2023-11-24 LAB — SURGICAL PATHOLOGY

## 2023-11-27 ENCOUNTER — Other Ambulatory Visit (HOSPITAL_COMMUNITY): Payer: Self-pay

## 2023-11-27 ENCOUNTER — Other Ambulatory Visit: Payer: Self-pay | Admitting: Radiology

## 2023-11-27 ENCOUNTER — Other Ambulatory Visit: Payer: Self-pay

## 2023-11-27 DIAGNOSIS — N951 Menopausal and female climacteric states: Secondary | ICD-10-CM

## 2023-11-27 DIAGNOSIS — N926 Irregular menstruation, unspecified: Secondary | ICD-10-CM

## 2023-11-27 MED ORDER — ESTRADIOL 0.025 MG/24HR TD PTTW
1.0000 | MEDICATED_PATCH | TRANSDERMAL | 2 refills | Status: DC
  Filled 2023-11-27: qty 24, 84d supply, fill #0

## 2023-11-27 MED ORDER — PROGESTERONE MICRONIZED 100 MG PO CAPS
100.0000 mg | ORAL_CAPSULE | Freq: Every evening | ORAL | 2 refills | Status: DC
  Filled 2023-11-27: qty 90, 90d supply, fill #0

## 2024-01-08 NOTE — Telephone Encounter (Signed)
 Spoke with patient. Changing saturated Super plus tampon 1-1.5 hrs. Has reviewed MyChart recommendations. OV scheduled for 9/12 at 0815. ER precautions reviewed for new or worsening symptoms. Patient appreciative of call.   Routing to provider for final review. Patient is agreeable to disposition. Will close encounter.

## 2024-01-08 NOTE — Telephone Encounter (Signed)
 Call placed to patient, left detailed message, ok per dpr. Advised per [place], return call to office at [phone], opt 4 to provide update.

## 2024-01-08 NOTE — Telephone Encounter (Signed)
 Not yet menopausal, FSH was low at her last visit. It may be a heavy period. I would recommend doubling up on the progesterone  every night x 2 weeks. BUT if she is bleeding through a super + still she needs an OV for eval and labs.

## 2024-01-09 ENCOUNTER — Other Ambulatory Visit (HOSPITAL_COMMUNITY): Payer: Self-pay

## 2024-01-09 ENCOUNTER — Other Ambulatory Visit: Payer: Self-pay

## 2024-01-09 ENCOUNTER — Encounter: Payer: Self-pay | Admitting: Radiology

## 2024-01-09 ENCOUNTER — Ambulatory Visit: Admitting: Radiology

## 2024-01-09 VITALS — BP 124/82 | HR 72 | Wt 217.0 lb

## 2024-01-09 DIAGNOSIS — N926 Irregular menstruation, unspecified: Secondary | ICD-10-CM

## 2024-01-09 MED ORDER — PROGESTERONE MICRONIZED 100 MG PO CAPS
200.0000 mg | ORAL_CAPSULE | Freq: Every evening | ORAL | 11 refills | Status: AC
  Filled 2024-01-09 – 2024-02-09 (×5): qty 60, 30d supply, fill #0
  Filled 2024-03-17: qty 60, 30d supply, fill #1
  Filled 2024-04-15: qty 60, 30d supply, fill #2
  Filled 2024-05-14: qty 60, 30d supply, fill #3

## 2024-01-09 NOTE — Progress Notes (Signed)
      Subjective: Julie Yang is a 53 y.o. perimenopausal female who complains of heavy vaginal bleeding since 01/03/24 with clots. On vivelle  0.025mg  and progesterone  100mg  nightly.  Was soaking through a super tampon in an hour. Reports increased fatigue. Increased progesterone  100 mg to #2 yesterday and bleeding decreased. Negative EMB 11/20/23 after u/s showed thickened endometrium. Previously had Mirena but had removed due to an autoimmune flare after insertion.    Review of Systems  All other systems reviewed and are negative.   Past Medical History:  Diagnosis Date   Abnormal Pap smear of cervix    1990s--hx of cryotherapy to cervix   Allergy 2009   All are related to the chronic urticaria   Anxiety    COVID-19 virus infection 10/2019   Depression    Genital warts    GERD (gastroesophageal reflux disease)    Hypertension    Hypothyroidism    Joint pain    PSVT (paroxysmal supraventricular tachycardia) (HCC)    Thyroid  disease 2013   Urticaria    chronic autoimmune urticaria   Vitamin D  deficiency       Objective:  Today's Vitals   01/09/24 0812  BP: 124/82  Pulse: 72  SpO2: 98%  Weight: 217 lb (98.4 kg)   Body mass index is 37.84 kg/m.   Physical Exam Vitals and nursing note reviewed. Exam conducted with a chaperone present.  Constitutional:      Appearance: Normal appearance. She is well-developed.  Pulmonary:     Effort: Pulmonary effort is normal.  Abdominal:     General: Abdomen is flat.     Palpations: Abdomen is soft.  Genitourinary:    General: Normal vulva.     Vagina: Bleeding (moderate, no clotting on exam) present. No vaginal discharge, erythema or lesions.     Cervix: Normal. No discharge, friability, lesion or erythema.     Uterus: Normal.      Adnexa: Right adnexa normal and left adnexa normal.  Neurological:     Mental Status: She is alert.  Psychiatric:        Mood and Affect: Mood normal.        Thought Content: Thought  content normal.        Judgment: Judgment normal.     Darice Hoit, CMA present for exam  Assessment:/Plan:  1. Irregular bleeding (Primary) - CBC - Iron, TIBC and Ferritin Panel - progesterone  (PROMETRIUM ) 100 MG capsule; Take 2 capsules (200 mg total) by mouth at bedtime.  Dispense: 60 capsule; Refill: 11   Continue increased progesterone  nightly. Does not want to stop vivelle  patch yet. Not interested in Mirena due to previous experience. Will contact with labs. Warning signs reviewed.   Yash Cacciola B, NP 9:25 AM

## 2024-01-10 LAB — CBC
HCT: 41.4 % (ref 35.0–45.0)
Hemoglobin: 13.5 g/dL (ref 11.7–15.5)
MCH: 31 pg (ref 27.0–33.0)
MCHC: 32.6 g/dL (ref 32.0–36.0)
MCV: 95.2 fL (ref 80.0–100.0)
MPV: 9.7 fL (ref 7.5–12.5)
Platelets: 378 Thousand/uL (ref 140–400)
RBC: 4.35 Million/uL (ref 3.80–5.10)
RDW: 12.3 % (ref 11.0–15.0)
WBC: 9.6 Thousand/uL (ref 3.8–10.8)

## 2024-01-10 LAB — IRON,TIBC AND FERRITIN PANEL
%SAT: 23 % (ref 16–45)
Ferritin: 16 ng/mL (ref 16–232)
Iron: 83 ug/dL (ref 45–160)
TIBC: 364 ug/dL (ref 250–450)

## 2024-01-12 ENCOUNTER — Other Ambulatory Visit (HOSPITAL_COMMUNITY): Payer: Self-pay

## 2024-01-12 ENCOUNTER — Other Ambulatory Visit: Payer: Self-pay

## 2024-01-12 ENCOUNTER — Ambulatory Visit: Payer: Self-pay | Admitting: Radiology

## 2024-01-14 ENCOUNTER — Ambulatory Visit: Admitting: Radiology

## 2024-01-27 ENCOUNTER — Other Ambulatory Visit (HOSPITAL_COMMUNITY): Payer: Self-pay

## 2024-01-27 ENCOUNTER — Other Ambulatory Visit: Payer: Self-pay

## 2024-01-27 ENCOUNTER — Encounter: Payer: Self-pay | Admitting: Physician Assistant

## 2024-01-27 MED ORDER — SERTRALINE HCL 25 MG PO TABS
25.0000 mg | ORAL_TABLET | Freq: Every day | ORAL | 3 refills | Status: AC
  Filled 2024-01-27: qty 90, 90d supply, fill #0
  Filled 2024-04-27: qty 90, 90d supply, fill #1

## 2024-02-05 ENCOUNTER — Other Ambulatory Visit: Payer: Self-pay

## 2024-02-05 ENCOUNTER — Other Ambulatory Visit (HOSPITAL_COMMUNITY): Payer: Self-pay

## 2024-02-09 ENCOUNTER — Other Ambulatory Visit: Payer: Self-pay

## 2024-02-09 ENCOUNTER — Other Ambulatory Visit (HOSPITAL_COMMUNITY): Payer: Self-pay

## 2024-02-10 ENCOUNTER — Other Ambulatory Visit: Payer: Self-pay

## 2024-02-11 ENCOUNTER — Other Ambulatory Visit (HOSPITAL_COMMUNITY): Payer: Self-pay

## 2024-02-17 ENCOUNTER — Other Ambulatory Visit: Payer: Self-pay | Admitting: Medical Genetics

## 2024-02-17 DIAGNOSIS — Z006 Encounter for examination for normal comparison and control in clinical research program: Secondary | ICD-10-CM

## 2024-02-24 ENCOUNTER — Other Ambulatory Visit (HOSPITAL_BASED_OUTPATIENT_CLINIC_OR_DEPARTMENT_OTHER): Payer: Self-pay

## 2024-02-24 MED ORDER — FLUZONE 0.5 ML IM SUSY
0.5000 mL | PREFILLED_SYRINGE | Freq: Once | INTRAMUSCULAR | 0 refills | Status: AC
  Filled 2024-02-24: qty 0.5, 1d supply, fill #0

## 2024-03-17 ENCOUNTER — Other Ambulatory Visit (HOSPITAL_COMMUNITY): Payer: Self-pay

## 2024-03-19 LAB — GENECONNECT MOLECULAR SCREEN: Genetic Analysis Overall Interpretation: NEGATIVE

## 2024-04-07 ENCOUNTER — Other Ambulatory Visit (HOSPITAL_COMMUNITY): Payer: Self-pay

## 2024-04-12 ENCOUNTER — Telehealth: Payer: Self-pay | Admitting: Physician Assistant

## 2024-04-12 NOTE — Telephone Encounter (Signed)
 LVM advising pt that the current physical is scheduled less than a year from previous. Requested callback to reschedule for INS coverage

## 2024-04-15 ENCOUNTER — Other Ambulatory Visit: Payer: Self-pay

## 2024-04-27 ENCOUNTER — Other Ambulatory Visit: Payer: Self-pay

## 2024-05-14 ENCOUNTER — Other Ambulatory Visit: Payer: Self-pay

## 2024-05-27 NOTE — Progress Notes (Signed)
 "        I, Ileana Collet, PhD, LAT, ATC acting as a scribe for Artist Lloyd, MD.  Julie Yang is a 54 y.o. female who presents to [facility] today for R elbow pain x 2-3 wks. She started doing more weight lifting at the gym. Pt locates pain to the lateral aspect of her R elbow.  Radiates: yes- w/ pronation pain will radiate proximally, w  Paresthesia: no Grip strength: painful Aggravates: gripping motions, opening bottles,  Treatments tried: rest, cream, IBU, brace  Pertinent review of systems: No fevers or chills  Relevant historical information: Hypothyroidism   Exam:  BP 124/82   Pulse 82   Ht 5' 3.5 (1.613 m)   Wt 200 lb (90.7 kg)   SpO2 96%   BMI 34.87 kg/m  General: Well Developed, well nourished, and in no acute distress.   MSK: Right elbow normal appearing Tender palpation lateral epicondyle.  Decreased range of motion lacks full extension with supination. Strength is intact to flexion and extension at elbow.  Strength is mildly reduced with pain at wrist extension. Pulses cap refill and sensation are intact distally.   [facility] MSK Ultrasound of: Right elbow lateral Tiny avulsion fleck is present at the lateral epicondyle which is consistent with lateral epicondylitis.  No visible tear of the common extensor tendon origin is present.  Moderate joint effusion is present at the radial head. Impression: Joint effusion and mild lateral epicondylitis  X-ray images obtained today right elbow personally and independently interpreted. No acute fractures no severe degenerative changes.  Mild joint effusion is present. Await formal radiology review   Assessment and Plan: 54 y.o. female with right lateral elbow pain occurring without injury.  This occurs in the setting of prior lateral epicondylitis.  Symptoms today are consistent with lateral condyle lightest.  However she does have a joint effusion which is not consistent with  lateral epicondylitis.  We discussed options.  Plan for rheumatologic assessment listed below as well as home exercise program.  I did prescribe a backup course of prednisone  that she can use if worsening.  Of note she did have a lot of flushing with Solu-Medrol injection in the past but has tolerated prednisone  well.   PDMP not reviewed this encounter. Orders Placed This Encounter  Procedures   DG ELBOW COMPLETE RIGHT (3+VIEW)    Standing Status:   Future    Number of Occurrences:   1    Expiration Date:   06/26/2024    Reason for Exam (SYMPTOM  OR DIAGNOSIS REQUIRED):   right elbow pain    Preferred imaging location?:   [place]    Is patient pregnant?:   No   US  LIMITED JOINT SPACE STRUCTURES UP RIGHT(NO LINKED CHARGES)    Reason for Exam (SYMPTOM  OR DIAGNOSIS REQUIRED):   right elbow pain    Preferred imaging location?:   [facility]   Uric acid    Standing Status:   Future    Number of Occurrences:   1    Expiration Date:   05/28/2025   ANA    Standing Status:   Future    Number of Occurrences:   1    Expiration Date:   05/28/2025   Cyclic citrul peptide antibody, IgG    Standing Status:   Future    Number of Occurrences:   1    Expiration Date:   05/28/2025   HLA-B27 antigen  Polyarthalgia    Standing Status:   Future    Number of Occurrences:   1    Expiration Date:   05/28/2025   Rheumatoid factor    Standing Status:   Future    Number of Occurrences:   1    Expiration Date:   05/28/2025   Sedimentation rate    Standing Status:   Future    Number of Occurrences:   1    Expiration Date:   05/28/2025   Meds ordered this encounter  Medications   predniSONE  (DELTASONE ) 50 MG tablet    Sig: Take 1 pill daily for 5 days    Dispense:  5 tablet    Refill:  0     Discussed warning signs or symptoms. Please see discharge instructions. Patient expresses understanding.   The above documentation has been reviewed and is accurate and  complete Artist Lloyd, M.D.   "

## 2024-05-28 ENCOUNTER — Other Ambulatory Visit: Payer: Self-pay

## 2024-05-28 ENCOUNTER — Ambulatory Visit: Admitting: Family Medicine

## 2024-05-28 ENCOUNTER — Ambulatory Visit

## 2024-05-28 VITALS — BP 124/82 | HR 82 | Ht 63.5 in | Wt 200.0 lb

## 2024-05-28 DIAGNOSIS — M25521 Pain in right elbow: Secondary | ICD-10-CM

## 2024-05-28 DIAGNOSIS — M25421 Effusion, right elbow: Secondary | ICD-10-CM | POA: Diagnosis not present

## 2024-05-28 LAB — SEDIMENTATION RATE: Sed Rate: 10 mm/h (ref 0–30)

## 2024-05-28 LAB — URIC ACID: Uric Acid, Serum: 4.9 mg/dL (ref 2.4–7.0)

## 2024-05-28 MED ORDER — PREDNISONE 50 MG PO TABS
ORAL_TABLET | ORAL | 0 refills | Status: AC
  Filled 2024-05-28: qty 5, 5d supply, fill #0

## 2024-05-28 NOTE — Patient Instructions (Addendum)
 Thank you for coming in today.   Please get labs today before you leave   Please work on the home exercises the athletic trainer went over with you:  View at [email or web address] code [phone]  Let me know how it goes  Please use Voltaren  gel (Generic Diclofenac  Gel) up to 4x daily for pain as needed.  This is available over-the-counter as both the name brand Voltaren  gel and the generic diclofenac  gel.   I recommend you obtained a compression sleeve to help with your joint problems. There are many options on the market however I recommend obtaining a Full Elbow Body Helix compression sleeve.  You can find information (including how to appropriate measure yourself for sizing) can be found at [email or web address].  Many of these products are health savings account (HSA) eligible.   You can use the compression sleeve at any time throughout the day but is most important to use while being active as well as for 2 hours post-activity.   It is appropriate to ice following activity with the compression sleeve in place.

## 2024-06-01 ENCOUNTER — Ambulatory Visit: Payer: Self-pay | Admitting: Family Medicine

## 2024-06-01 DIAGNOSIS — M25421 Effusion, right elbow: Secondary | ICD-10-CM

## 2024-06-01 DIAGNOSIS — M25521 Pain in right elbow: Secondary | ICD-10-CM

## 2024-06-01 LAB — ANTI-NUCLEAR AB-TITER (ANA TITER)
ANA TITER: 1:160 {titer} — ABNORMAL HIGH
ANA Titer 1: 1:80 {titer} — ABNORMAL HIGH

## 2024-06-01 LAB — RHEUMATOID FACTOR: Rheumatoid fact SerPl-aCnc: <10 [IU]/mL

## 2024-06-01 LAB — CYCLIC CITRUL PEPTIDE ANTIBODY, IGG: Cyclic Citrullin Peptide Ab: <16 U

## 2024-06-01 LAB — HLA-B27 ANTIGEN: HLA-B27 Antigen: NEGATIVE

## 2024-06-01 LAB — ANA: Anti Nuclear Antibody (ANA): POSITIVE — AB

## 2024-06-01 NOTE — Progress Notes (Signed)
Right elbow x-ray looks normal to radiology

## 2024-06-01 NOTE — Progress Notes (Signed)
 So far labs look okay.  Uric acid level is normal and sedimentation rate a marker of general inflammation is also normal.  Other labs are still pending

## 2024-06-03 NOTE — Progress Notes (Signed)
 Your ANA titer is positive at 1:160. I do think this means something. I will refer to rheumatology. You should hear soon about scheduling.

## 2024-07-19 ENCOUNTER — Encounter: Admitting: Physician Assistant

## 2024-07-26 ENCOUNTER — Encounter: Admitting: Physician Assistant
